# Patient Record
Sex: Female | Born: 1975
Health system: Southern US, Community
[De-identification: ages and names within clinical notes are randomized; demographics above are authoritative.]

## PROBLEM LIST (undated history)

## (undated) DIAGNOSIS — K429 Umbilical hernia without obstruction or gangrene: Secondary | ICD-10-CM

## (undated) DIAGNOSIS — K219 Gastro-esophageal reflux disease without esophagitis: Secondary | ICD-10-CM

## (undated) DIAGNOSIS — D649 Anemia, unspecified: Secondary | ICD-10-CM

## (undated) DIAGNOSIS — F329 Major depressive disorder, single episode, unspecified: Secondary | ICD-10-CM

## (undated) DIAGNOSIS — F419 Anxiety disorder, unspecified: Secondary | ICD-10-CM

## (undated) DIAGNOSIS — F4310 Post-traumatic stress disorder, unspecified: Secondary | ICD-10-CM

## (undated) DIAGNOSIS — B019 Varicella without complication: Secondary | ICD-10-CM

## (undated) DIAGNOSIS — J301 Allergic rhinitis due to pollen: Secondary | ICD-10-CM

## (undated) DIAGNOSIS — F32A Depression, unspecified: Secondary | ICD-10-CM

## (undated) HISTORY — DX: Allergic rhinitis due to pollen: J30.1

## (undated) HISTORY — DX: Varicella without complication: B01.9

## (undated) HISTORY — PX: APPENDECTOMY: SHX54

## (undated) HISTORY — DX: Anemia, unspecified: D64.9

## (undated) HISTORY — PX: TUBAL LIGATION: SHX77

## (undated) HISTORY — DX: Gastro-esophageal reflux disease without esophagitis: K21.9

---
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
## 2013-01-23 DIAGNOSIS — J301 Allergic rhinitis due to pollen: Secondary | ICD-10-CM

## 2013-01-23 HISTORY — DX: Allergic rhinitis due to pollen: J30.1

## 2013-04-23 ENCOUNTER — Emergency Department (HOSPITAL_COMMUNITY)
Admission: EM | Admit: 2013-04-23 | Discharge: 2013-04-23 | Disposition: A | Discharge status: Home / Self Care | Payer: Federal, State, Local not specified - PPO | Attending: Emergency Medicine | Admitting: Emergency Medicine

## 2013-04-23 ENCOUNTER — Encounter (HOSPITAL_COMMUNITY): Payer: Self-pay | Admitting: Emergency Medicine

## 2013-04-23 ENCOUNTER — Emergency Department (HOSPITAL_COMMUNITY): Payer: Federal, State, Local not specified - PPO

## 2013-04-23 DIAGNOSIS — F329 Major depressive disorder, single episode, unspecified: Secondary | ICD-10-CM | POA: Insufficient documentation

## 2013-04-23 DIAGNOSIS — F32A Depression, unspecified: Secondary | ICD-10-CM

## 2013-04-23 DIAGNOSIS — F411 Generalized anxiety disorder: Secondary | ICD-10-CM | POA: Insufficient documentation

## 2013-04-23 DIAGNOSIS — F419 Anxiety disorder, unspecified: Secondary | ICD-10-CM

## 2013-04-23 DIAGNOSIS — R109 Unspecified abdominal pain: Secondary | ICD-10-CM | POA: Insufficient documentation

## 2013-04-23 DIAGNOSIS — I880 Nonspecific mesenteric lymphadenitis: Secondary | ICD-10-CM | POA: Insufficient documentation

## 2013-04-23 DIAGNOSIS — N3090 Cystitis, unspecified without hematuria: Secondary | ICD-10-CM | POA: Insufficient documentation

## 2013-04-23 DIAGNOSIS — F3289 Other specified depressive episodes: Secondary | ICD-10-CM | POA: Insufficient documentation

## 2013-04-23 DIAGNOSIS — Z3202 Encounter for pregnancy test, result negative: Secondary | ICD-10-CM | POA: Insufficient documentation

## 2013-04-23 HISTORY — DX: Depression, unspecified: F32.A

## 2013-04-23 HISTORY — DX: Anxiety disorder, unspecified: F41.9

## 2013-04-23 HISTORY — DX: Major depressive disorder, single episode, unspecified: F32.9

## 2013-04-23 LAB — URINALYSIS, ROUTINE W REFLEX MICROSCOPIC
Bilirubin Urine: NEGATIVE
Glucose, UA: NEGATIVE mg/dL
Hgb urine dipstick: NEGATIVE
Ketones, ur: NEGATIVE mg/dL
LEUKOCYTES UA: NEGATIVE
Nitrite: NEGATIVE
PH: 6 (ref 5.0–8.0)
Protein, ur: NEGATIVE mg/dL
SPECIFIC GRAVITY, URINE: 1.02 (ref 1.005–1.030)
Urobilinogen, UA: 0.2 mg/dL (ref 0.0–1.0)

## 2013-04-23 LAB — I-STAT CHEM 8, ED
BUN: 9 mg/dL (ref 6–23)
Calcium, Ion: 1.18 mmol/L (ref 1.12–1.23)
Chloride: 104 mEq/L (ref 96–112)
Creatinine, Ser: 0.9 mg/dL (ref 0.50–1.10)
Glucose, Bld: 95 mg/dL (ref 70–99)
HEMATOCRIT: 39 % (ref 36.0–46.0)
HEMOGLOBIN: 13.3 g/dL (ref 12.0–15.0)
POTASSIUM: 4.2 meq/L (ref 3.7–5.3)
SODIUM: 140 meq/L (ref 137–147)
TCO2: 24 mmol/L (ref 0–100)

## 2013-04-23 LAB — PREGNANCY, URINE: Preg Test, Ur: NEGATIVE

## 2013-04-23 MED ORDER — HYDROCODONE-ACETAMINOPHEN 5-325 MG PO TABS: 1.0000 | ORAL_TABLET | Freq: Four times a day (QID) | ORAL | Status: DC | PRN

## 2013-04-23 MED ORDER — CLONAZEPAM 0.5 MG PO TABS: 0.5000 mg | ORAL_TABLET | Freq: Two times a day (BID) | ORAL | Status: DC | PRN

## 2013-04-23 MED ORDER — BUPROPION HCL ER (XL) 150 MG PO TB24: 150.0000 mg | ORAL_TABLET | Freq: Every day | ORAL | Status: DC

## 2013-04-23 NOTE — ED Notes (Signed)
Called main lab about UA, they said they will take care of it.

## 2013-04-23 NOTE — Discharge Instructions (Signed)

## 2013-04-23 NOTE — ED Notes (Signed)
Pt reports right flank pain for a few days and burning urination, sts last months cycle was worse than normal and very heavy, sts that it is time now for cycle again.

## 2013-04-23 NOTE — ED Notes (Signed)
Patient transported to CT 

## 2013-04-23 NOTE — ED Provider Notes (Signed)
CSN: [id number]     Arrival date & time 04/23/13  0606 History   First MD Initiated Contact with Patient 04/23/13 [phone]     Chief Complaint  Patient presents with  . Flank Pain  . Dysuria     (Consider location/radiation/quality/duration/timing/severity/associated sxs/prior Treatment) HPI Comments: Right flank pain with burning for the last couple of days. Pt states that she has not had fever, vomiting. Pain is achy in nature. Pt states that she is due for her period in the next week. Pt states that she has been under a lot of stress and she is new to the area. Pt states that she has been on wellbutrin and clonipin but have run out of both. Pt denies si/hi. Pt hasn't established care with a pcp in this are yet  Patient is a 38 y.o. female presenting with flank pain and dysuria. The history is provided by the patient. No language interpreter was used.  Flank Pain This is a new problem. The current episode started in the past 7 days. The problem occurs constantly. The problem has been unchanged. Pertinent negatives include no abdominal pain, fever or vomiting. Nothing aggravates the symptoms. She has tried nothing for the symptoms.  Dysuria Associated symptoms: flank pain   Associated symptoms: no abdominal pain, no fever and no vomiting     Past Medical History  Diagnosis Date  . Anxiety   . Depression    History reviewed. No pertinent past surgical history. History reviewed. No pertinent family history. History  Substance Use Topics  . Smoking status: Never Smoker   . Smokeless tobacco: Not on file  . Alcohol Use: No   OB History   Grav Para Term Preterm Abortions TAB SAB Ect Mult Living                 Review of Systems  Constitutional: Negative for fever.  Respiratory: Negative.   Cardiovascular: Negative.   Gastrointestinal: Negative for vomiting and abdominal pain.  Genitourinary: Positive for dysuria and flank pain.      Allergies  Review of patient's allergies  indicates not on file.  Home Medications  No current outpatient prescriptions on file. BP 126/79  Pulse 73  Temp(Src) 97.7 F (36.5 C) (Oral)  Resp 16  Ht 5\' 7"  (1.702 m)  Wt 230 lb (104.327 kg)  BMI 36.01 kg/m2  SpO2 98%  LMP 03/26/2013 Physical Exam  Nursing note and vitals reviewed. Constitutional: She is oriented to person, place, and time. She appears well-developed and well-nourished.  HENT:  Head: Normocephalic and atraumatic.  Cardiovascular: Normal rate and regular rhythm.   Pulmonary/Chest: Effort normal and breath sounds normal.  Abdominal: Soft. Bowel sounds are normal. There is no tenderness.  Musculoskeletal: Normal range of motion.  Neurological: She is alert and oriented to person, place, and time. Coordination normal.  Skin: Skin is warm and dry.  Psychiatric: She has a normal mood and affect.    ED Course  Procedures (including critical care time) Labs Review Labs Reviewed  URINALYSIS, ROUTINE W REFLEX MICROSCOPIC  PREGNANCY, URINE  I-STAT CHEM 8, ED   Imaging Review Ct Abdomen Pelvis Wo Contrast  04/23/2013   CLINICAL DATA:  Flank pain and dysuria  EXAM: CT ABDOMEN AND PELVIS WITHOUT CONTRAST  TECHNIQUE: Multidetector CT imaging of the abdomen and pelvis was performed following the standard protocol without oral or intravenous contrast maternal administration.  COMPARISON:  None.  FINDINGS: Lung bases are clear.  Liver is prominent, measuring 17.1 cm in  length. No focal liver lesions are identified on this noncontrast enhanced study. There is no biliary duct dilatation. Gallbladder wall does not appear appreciably thickened.  Spleen, pancreas, and adrenals appear normal. There is a 1.6 x 1.6 cm cyst in the lateral mid to upper pole left kidney. There is a prominent junctional parenchymal defect on the left, an anatomic variant. No non- cystic renal masses are appreciated on this study. There is no intrarenal calculus or hydronephrosis on either side. There is  no ureteral calculus or ureterectasis on either side.  In the pelvis, the urinary bladder is midline. There is slight urinary bladder wall thickening. There is no evidence of pelvic mass or fluid collection. There are phleboliths in the appendix. The appendix is not enlarged, and there is no periappendiceal region inflammation.  There is no bowel obstruction.  No free air or portal venous air.  There are scattered small mesenteric lymph nodes in the lower abdomen which do not meet size criteria for pathologic significance. By size criteria, there is no adenopathy in the abdomen or pelvis. There is no ascites or abscess in the abdomen or pelvis. There is a small ventral hernia containing only fat.  Aorta shows no evidence of aneurysm. There are no blastic or lytic bone lesions.  IMPRESSION: The wall of the urinary bladder is mildly thickened. Question a degree of cystitis. There is no renal or ureteral calculus. No hydronephrosis.  No mesenteric inflammation or abscess. Appendix region appears normal.  There are scattered small lymph nodes in the lower abdomen, primarily on the right. This finding is of questionable significance. A degree of mesenteric adenitis is possible in the appropriate clinical setting.  There is a small ventral hernia containing only fat. Liver is prominent but otherwise normal in appearance on this noncontrast enhanced study.   Electronically Signed   By: Lowella Grip M.D.   On: 04/23/2013 08:50     EKG Interpretation None      MDM   Final diagnoses:  Flank pain  Anxiety  Depression  Mesenteric adenitis  Cystitis    Cystitis likely chronic in nature. No infection noted in urine. No si/hi. Will refill chronic medications. Discussed adenitis with pt and don't think needs to be treated at this time although if symptoms worsen pt to follow up    Glendell Docker, NP 04/23/13 [phone]

## 2013-04-25 NOTE — ED Provider Notes (Signed)
Medical screening examination/treatment/procedure(s) were performed by non-physician practitioner and as supervising physician I was immediately available for consultation/collaboration.   EKG Interpretation None        Mariea Clonts, MD 04/25/13 2036

## 2013-05-04 ENCOUNTER — Emergency Department (HOSPITAL_COMMUNITY): Payer: Federal, State, Local not specified - PPO

## 2013-05-04 ENCOUNTER — Encounter (HOSPITAL_COMMUNITY): Payer: Self-pay | Admitting: Emergency Medicine

## 2013-05-04 ENCOUNTER — Emergency Department (HOSPITAL_COMMUNITY)
Admission: EM | Admit: 2013-05-04 | Discharge: 2013-05-04 | Disposition: A | Discharge status: Home / Self Care | Payer: Federal, State, Local not specified - PPO | Attending: Emergency Medicine | Admitting: Emergency Medicine

## 2013-05-04 DIAGNOSIS — Z8719 Personal history of other diseases of the digestive system: Secondary | ICD-10-CM | POA: Insufficient documentation

## 2013-05-04 DIAGNOSIS — IMO0002 Reserved for concepts with insufficient information to code with codable children: Secondary | ICD-10-CM | POA: Insufficient documentation

## 2013-05-04 DIAGNOSIS — S99919A Unspecified injury of unspecified ankle, initial encounter: Principal | ICD-10-CM

## 2013-05-04 DIAGNOSIS — W64XXXA Exposure to other animate mechanical forces, initial encounter: Secondary | ICD-10-CM | POA: Insufficient documentation

## 2013-05-04 DIAGNOSIS — S99929A Unspecified injury of unspecified foot, initial encounter: Secondary | ICD-10-CM

## 2013-05-04 DIAGNOSIS — Y9289 Other specified places as the place of occurrence of the external cause: Secondary | ICD-10-CM | POA: Insufficient documentation

## 2013-05-04 DIAGNOSIS — Z880 Allergy status to penicillin: Secondary | ICD-10-CM | POA: Insufficient documentation

## 2013-05-04 DIAGNOSIS — Y9389 Activity, other specified: Secondary | ICD-10-CM | POA: Insufficient documentation

## 2013-05-04 DIAGNOSIS — Z79899 Other long term (current) drug therapy: Secondary | ICD-10-CM | POA: Insufficient documentation

## 2013-05-04 DIAGNOSIS — F329 Major depressive disorder, single episode, unspecified: Secondary | ICD-10-CM | POA: Insufficient documentation

## 2013-05-04 DIAGNOSIS — S8990XA Unspecified injury of unspecified lower leg, initial encounter: Secondary | ICD-10-CM | POA: Insufficient documentation

## 2013-05-04 DIAGNOSIS — F3289 Other specified depressive episodes: Secondary | ICD-10-CM | POA: Insufficient documentation

## 2013-05-04 DIAGNOSIS — F411 Generalized anxiety disorder: Secondary | ICD-10-CM | POA: Insufficient documentation

## 2013-05-04 HISTORY — DX: Umbilical hernia without obstruction or gangrene: K42.9

## 2013-05-04 MED ORDER — IBUPROFEN 800 MG PO TABS: 800.0000 mg | ORAL_TABLET | Freq: Three times a day (TID) | ORAL | Status: DC

## 2013-05-04 NOTE — ED Provider Notes (Signed)
CSN: [id number]     Arrival date & time 05/04/13  1754 History  This chart was scribed for non-physician practitioner, Lorrine Kin, PA-C, working with Dot Lanes, MD by Ladene Artist, ED Scribe. This patient was seen in room WTR8/WTR8 and the patient's care was started at 7:26 PM.    Chief Complaint  Patient presents with  . Foot Injury    The history is provided by the patient. No language interpreter was used.   HPI Comments: Tiffany [name] is a 38 y.o. female who presents to the Emergency Department complaining of constant lateral L foot pain onset yesterday. Pt states that the pain radiates up her leg. Pt states that her husband and dog stepped on her foot.   Past Medical History  Diagnosis Date  . Anxiety   . Depression   . Umbilical hernia    Past Surgical History  Procedure Laterality Date  . Appendectomy     History reviewed. No pertinent family history. History  Substance Use Topics  . Smoking status: Never Smoker   . Smokeless tobacco: Not on file  . Alcohol Use: No   OB History   Grav Para Term Preterm Abortions TAB SAB Ect Mult Living                 Review of Systems  Musculoskeletal: Positive for gait problem, joint swelling and myalgias.  Skin: Negative for color change, pallor, rash and wound.  Neurological: Negative for numbness.  All other systems reviewed and are negative.    Allergies  Mango flavor and Penicillins  Home Medications   Current Outpatient Rx  Name  Route  Sig  Dispense  Refill  . buPROPion (WELLBUTRIN XL) 150 MG 24 hr tablet   Oral   Take 1 tablet (150 mg total) by mouth daily.   30 tablet   0   . clonazePAM (KLONOPIN) 0.5 MG tablet   Oral   Take 1 tablet (0.5 mg total) by mouth 2 (two) times daily as needed for anxiety.   30 tablet   0   . HYDROcodone-acetaminophen (NORCO/VICODIN) 5-325 MG per tablet   Oral   Take 1-2 tablets by mouth every 6 (six) hours as needed.   10 tablet   0    Triage Vitals: BP  119/79  Pulse 76  Temp(Src) 98.7 F (37.1 C) (Oral)  SpO2 99%  LMP 04/26/2013 Physical Exam  Nursing note and vitals reviewed. Constitutional: She appears well-developed and well-nourished. No distress.  HENT:  Head: Normocephalic and atraumatic.  Eyes: EOM are normal.  Neck: Normal range of motion. Neck supple.  Cardiovascular: Normal rate and regular rhythm.   Pulses:      Dorsalis pedis pulses are 2+ on the right side, and 2+ on the left side.  Pulmonary/Chest: Effort normal. No respiratory distress.  Musculoskeletal:       Left foot: She exhibits tenderness. She exhibits normal range of motion, normal capillary refill, no crepitus and no deformity.       Feet:  Minimal swelling on the L lateral dorsal foot, No crepitus, No ecchymosis, No erythema, Full ROM to left ankle and foot. Normal sensation to light touch.  Neurological: She is alert.  Skin: Skin is warm, dry and intact. No ecchymosis noted. She is not diaphoretic. No erythema.  Psychiatric: She has a normal mood and affect. Her behavior is normal.    ED Course  Procedures (including critical care time)  COORDINATION OF CARE: 7:34 PM-Discussed treatment plan  which includes cold compresses, ibuprofen and post-op shoe with pt at bedside and pt agreed to plan.   Labs Review Labs Reviewed - No data to display Imaging Review Dg Foot Complete Left  05/04/2013   CLINICAL DATA:  Posttraumatic lateral foot pain.  EXAM: LEFT FOOT - COMPLETE 3+ VIEW  COMPARISON:  None.  FINDINGS: The mineralization and alignment are normal. There is no evidence of acute fracture or dislocation. No focal soft tissue swelling identified.  IMPRESSION: No acute osseous findings.   Electronically Signed   By: Camie Patience M.D.   On: 05/04/2013 18:43     EKG Interpretation None      MDM   Final diagnoses:  Foot injury   Pt with a foot injury, negative XR. Post op shoe, crutches ordered. RICE encouraged, ortho follow up. Discussed imaging  results, and treatment plan with the patient. Return precautions given. Reports understanding and no other concerns at this time.  Patient is stable for discharge at this time.  Meds given in ED:  Medications - No data to display  Discharge Medication List as of 05/04/2013  7:48 PM    START taking these medications   Details  ibuprofen (ADVIL,MOTRIN) 800 MG tablet Take 1 tablet (800 mg total) by mouth 3 (three) times daily. Take with meals, Starting 05/04/2013, Until Discontinued, Print        I personally performed the services described in this documentation, which was scribed in my presence. The recorded information has been reviewed and is accurate.    Lorrine Kin, PA-C 05/06/13 1140

## 2013-05-04 NOTE — ED Notes (Signed)
Pt states that her husband accidentally stepped on her L foot and now she has pain and feels like its broken. Alert and oriented.

## 2013-05-04 NOTE — Discharge Instructions (Signed)
Call Dr. Mardelle Matte for further evaluation of your foot injury. Call for a follow up appointment with a Family or Primary Care Provider.  Return if Symptoms worsen.   Take medication as prescribed.  Ice your foot 3-4 times a day, elevate your foot when you are not walking or standing.

## 2013-05-08 NOTE — ED Provider Notes (Signed)
Medical screening examination/treatment/procedure(s) were performed by non-physician practitioner and as supervising physician I was immediately available for consultation/collaboration.    Dot Lanes, MD 05/08/13 1540

## 2013-05-09 ENCOUNTER — Ambulatory Visit (INDEPENDENT_AMBULATORY_CARE_PROVIDER_SITE_OTHER): Payer: Federal, State, Local not specified - PPO | Admitting: General Surgery

## 2013-05-09 ENCOUNTER — Encounter (INDEPENDENT_AMBULATORY_CARE_PROVIDER_SITE_OTHER): Payer: Self-pay | Admitting: General Surgery

## 2013-05-09 VITALS — BP 108/70 | HR 68 | Temp 97.8°F | Resp 14 | Ht 67.0 in | Wt 240.0 lb

## 2013-05-09 DIAGNOSIS — K439 Ventral hernia without obstruction or gangrene: Secondary | ICD-10-CM

## 2013-05-09 NOTE — Patient Instructions (Signed)
Please call if you develop any lower abdominal mass or increased pain

## 2013-05-09 NOTE — Progress Notes (Addendum)
Patient ID: Tiffany [name], female   DOB: 1975-04-07, 38 y.o.   MRN: [id number]  Chief Complaint  Patient presents with  . New Evaluation    abd hernia    HPI Tiffany [name] is a 38 y.o. female.  Chief complaint: Ventral hernia seen on CT scan HPI Patient was having some suprapubic abdominal pain which was also extending around to her right flank. She was evaluated in the emergency department. CT scan demonstrated cystitis which she has been treated for. There was an incidental finding of a small ventral hernia containing fat at that time. She continues to have some lower abdominal pain without complain of discrete mass. No change in bowel habits. I was asked to see her in consultation by Dr. Mancel Bale regarding this noted hernia.  Past Medical History  Diagnosis Date  . Anxiety   . Depression   . Umbilical hernia     Past Surgical History  Procedure Laterality Date  . Cesarean section      2003  . Tubal ligation      2013  . Appendectomy      2002    Family History  Problem Relation Age of Onset  . Heart disease Mother 73    heart attack  . Hypertension Father 23    Social History History  Substance Use Topics  . Smoking status: Never Smoker   . Smokeless tobacco: Never Used  . Alcohol Use: No    Allergies  Allergen Reactions  . Mango Flavor Anaphylaxis and Swelling  . Penicillins Other (See Comments)    unknown    Current Outpatient Prescriptions  Medication Sig Dispense Refill  . buPROPion (WELLBUTRIN XL) 150 MG 24 hr tablet Take 1 tablet (150 mg total) by mouth daily.  30 tablet  0  . clonazePAM (KLONOPIN) 0.5 MG tablet Take 1 tablet (0.5 mg total) by mouth 2 (two) times daily as needed for anxiety.  30 tablet  0  . ibuprofen (ADVIL,MOTRIN) 800 MG tablet Take 1 tablet (800 mg total) by mouth 3 (three) times daily. Take with meals  21 tablet  0  . Naproxen Sodium 220 MG CAPS Take by mouth.      . tranexamic acid (LYSTEDA) 650 MG TABS tablet Take 1,300 mg by mouth  3 (three) times daily. During cycle       No current facility-administered medications for this visit.    Review of Systems Review of Systems  Constitutional: Negative for fever, chills and unexpected weight change.  HENT: Negative for congestion, hearing loss, sore throat, trouble swallowing and voice change.   Eyes: Negative for visual disturbance.  Respiratory: Negative for cough and wheezing.   Cardiovascular: Negative for chest pain, palpitations and leg swelling.  Gastrointestinal: Positive for abdominal pain. Negative for nausea, vomiting, diarrhea, constipation, blood in stool, abdominal distention and anal bleeding.       See history of present illness  Genitourinary: Positive for flank pain and pelvic pain. Negative for hematuria, vaginal bleeding and difficulty urinating.  Musculoskeletal: Negative for arthralgias.  Skin: Negative for rash and wound.  Neurological: Negative for seizures, syncope and headaches.  Hematological: Negative for adenopathy. Does not bruise/bleed easily.  Psychiatric/Behavioral: Negative for confusion.    Blood pressure 108/70, pulse 68, temperature 97.8 F (36.6 C), resp. rate 14, height 5\' 7"  (1.702 m), weight 240 lb (108.863 kg), last menstrual period 04/26/2013.  Physical Exam Physical Exam  Constitutional: She is oriented to person, place, and time. She appears well-developed and well-nourished.  HENT:  Head: Normocephalic and atraumatic.  Eyes: EOM are normal. Pupils are equal, round, and reactive to light.  Neck: Normal range of motion. Neck supple. No tracheal deviation present.  Cardiovascular: Normal rate and normal heart sounds.   Pulmonary/Chest: Effort normal and breath sounds normal. No stridor. No respiratory distress. She has no wheezes. She has no rales.  Abdominal: Soft. She exhibits no distension. There is no tenderness. There is no rebound and no guarding.  No lower midline hernia or other hernias felt, no masses, no  tenderness  Musculoskeletal: Normal range of motion. She exhibits no tenderness.  Neurological: She is alert and oriented to person, place, and time. She exhibits normal muscle tone.  Skin: Skin is warm and dry.  Psychiatric: She has a normal mood and affect.    Data Reviewed CT scan as described above  Assessment    No appreciable significant ventral hernia    Plan    If patient develops a mass along her lower midline or increased pain in that area, she will give [place] a call. There is no need for surgical intervention at this time.       Zenovia Jarred 05/09/2013, 9:49 AM

## 2013-06-10 ENCOUNTER — Encounter (HOSPITAL_COMMUNITY): Payer: Self-pay | Admitting: Pharmacist

## 2013-06-20 ENCOUNTER — Encounter (HOSPITAL_COMMUNITY): Admission: RE | Payer: Self-pay | Source: Ambulatory Visit

## 2013-06-20 SURGERY — DILATATION & CURETTAGE/HYSTEROSCOPY WITH NOVASURE ABLATION
Anesthesia: Choice

## 2013-07-07 ENCOUNTER — Ambulatory Visit (HOSPITAL_COMMUNITY)
Admission: RE | Admit: 2013-07-07 | Payer: Federal, State, Local not specified - PPO | Source: Ambulatory Visit | Admitting: Obstetrics and Gynecology

## 2013-07-15 ENCOUNTER — Ambulatory Visit (INDEPENDENT_AMBULATORY_CARE_PROVIDER_SITE_OTHER): Payer: Federal, State, Local not specified - PPO | Admitting: Family Medicine

## 2013-07-15 ENCOUNTER — Encounter: Payer: Self-pay | Admitting: Family Medicine

## 2013-07-15 VITALS — BP 112/80 | HR 79 | Temp 98.7°F | Ht 68.5 in | Wt 241.0 lb

## 2013-07-15 DIAGNOSIS — L723 Sebaceous cyst: Secondary | ICD-10-CM

## 2013-07-15 DIAGNOSIS — M545 Low back pain, unspecified: Secondary | ICD-10-CM

## 2013-07-15 DIAGNOSIS — L729 Follicular cyst of the skin and subcutaneous tissue, unspecified: Secondary | ICD-10-CM

## 2013-07-15 DIAGNOSIS — F329 Major depressive disorder, single episode, unspecified: Secondary | ICD-10-CM

## 2013-07-15 DIAGNOSIS — F410 Panic disorder [episodic paroxysmal anxiety] without agoraphobia: Secondary | ICD-10-CM

## 2013-07-15 DIAGNOSIS — F32A Depression, unspecified: Secondary | ICD-10-CM

## 2013-07-15 DIAGNOSIS — F411 Generalized anxiety disorder: Secondary | ICD-10-CM

## 2013-07-15 DIAGNOSIS — F3289 Other specified depressive episodes: Secondary | ICD-10-CM

## 2013-07-15 MED ORDER — DOXYCYCLINE HYCLATE 100 MG PO TABS: 100.0000 mg | ORAL_TABLET | Freq: Two times a day (BID) | ORAL | Status: DC

## 2013-07-15 MED ORDER — PAROXETINE HCL 10 MG PO TABS: 10.0000 mg | ORAL_TABLET | Freq: Every day | ORAL | Status: DC

## 2013-07-15 NOTE — Progress Notes (Signed)
No chief complaint on file.   HPI:  Tiffany [name] is here to establish care. Moved her in January from [place]. Last PCP and physical:  Has the following chronic problems and concerns today:  Boil under L armpit: -had a pea size cyst under arm in the past treated with compresses -now more tender and enlarged the last week  R low back: -started 2 days ago when getting out of her car and pulled sometime -denies: fevers, malaise, chills, weakness, numbness, bowel or bladder incontinence  Depression and anxiety: -since move -GAD and depressed mood at times, about 2 panic attacks a month and klonopin helps with this -denies: hx of suicidal thoughts or hospitalization, thoughts of self harm manic symptoms -currently on wellbutrin xl 150mg , not working well, lexapro didn't help much -sensitive to medications -not pregnant  Menorrhagia: -goes to ob/gyn for this and treated with tranexamic acid  Patient Active Problem List   Diagnosis Date Noted  . Ventral hernia 05/09/2013   Health Maintenance:  ROS: See pertinent positives and negatives per HPI.  Past Medical History  Diagnosis Date  . Anxiety   . Depression   . Umbilical hernia     Family History  Problem Relation Age of Onset  . Heart disease Mother 68    heart attack  . Hypertension Father 29    History   Social History  . Marital Status: Married    Spouse Name: N/A    Number of Children: N/A  . Years of Education: N/A   Social History Main Topics  . Smoking status: Never Smoker   . Smokeless tobacco: Never Used  . Alcohol Use: No  . Drug Use: No  . Sexual Activity: None   Other Topics Concern  . None   Social History Narrative   Work or School: works for [employer]: lives with husband and 4 children (12-20yo), husband is supportive "wonderful"      Spiritual Beliefs: Christian      Lifestyle: walks 3 times per week;  working on diet             Current outpatient prescriptions:clonazePAM (KLONOPIN) 0.5 MG tablet, Take 1 tablet (0.5 mg total) by mouth 2 (two) times daily as needed for anxiety., Disp: 30 tablet, Rfl: 0;  Multiple Vitamin (MULTIVITAMIN) tablet, Take 1 tablet by mouth daily., Disp: , Rfl: ;  tranexamic acid (LYSTEDA) 650 MG TABS tablet, Take 1,300 mg by mouth 2 (two) times daily. During cycle, Disp: , Rfl:  doxycycline (VIBRA-TABS) 100 MG tablet, Take 1 tablet (100 mg total) by mouth 2 (two) times daily., Disp: 20 tablet, Rfl: 0;  PARoxetine (PAXIL) 10 MG tablet, Take 1 tablet (10 mg total) by mouth daily., Disp: 30 tablet, Rfl: 3  EXAM:  Filed Vitals:   07/15/13 1432  BP: 112/80  Pulse: 79  Temp: 98.7 F (37.1 C)    Body mass index is 36.11 kg/(m^2).  GENERAL: vitals reviewed and listed above, alert, oriented, appears well hydrated and in no acute distress  HEENT: atraumatic, conjunttiva clear, no obvious abnormalities on inspection of external nose and ears  NECK: no obvious masses on inspection  LUNGS: clear to auscultation bilaterally, no wheezes, rales or rhonchi, good air movement  CV: HRRR, no peripheral edema  MS: moves all extremities without noticeable abnormality Normal Gait Normal inspection of back, no obvious scoliosis or leg length descrepancy No bony TTP Soft tissue TTP at: -/+  tests: neg trendelenburg,-facet loading, -SLRT, -CLRT, -FABER, -FADIR Normal muscle strength, sensation to light touch and DTRs in LEs bilaterally  SKIN: draining boil/cyst L axillary  PSYCH: pleasant and cooperative, no obvious depression or anxiety  ASSESSMENT AND PLAN:  Discussed the following assessment and plan:  GAD (generalized anxiety disorder) - Plan: PARoxetine (PAXIL) 10 MG tablet  Depression - Plan: PARoxetine (PAXIL) 10 MG tablet  Panic disorder - Plan: PARoxetine (PAXIL) 10 MG tablet  Low back pain without sciatica, unspecified back pain laterality  Cyst of  skin - Plan: doxycycline (VIBRA-TABS) 100 MG tablet  -We reviewed the PMH, PSH, FH, SH, Meds and Allergies. -We provided refills for any medications we will prescribe as needed. -We addressed current concerns per orders and patient instructions. -We have asked for records for pertinent exams, studies, vaccines and notes from previous providers. -We have advised patient to follow up per instructions below.   -Patient advised to return or notify a doctor immediately if symptoms worsen or persist or new concerns arise.  There are no Patient Instructions on file for this visit.   Colin Benton R.

## 2013-07-15 NOTE — Progress Notes (Signed)
Pre visit review using our clinic review tool, if applicable. No additional management support is needed unless otherwise documented below in the visit note. 

## 2013-07-15 NOTE — Patient Instructions (Signed)
-  For Depression and Anxiety: -start the paxil: as we discussed we are starting a low dose of this given your history of sensitivity to medication but we have plenty of room to increase if needed. -get counseling - ask for exercises to do at home -klonpin on a very rare basis for panic attacks  For cyst: -start the antibiotic today -compresses -follow up as needed  For low back pain: -heat for 15 minutes twice dialy -aleve or tylenol per instructions -exercises provided, follow up in 1 month

## 2013-08-14 ENCOUNTER — Encounter: Payer: Self-pay | Admitting: Family Medicine

## 2013-08-14 ENCOUNTER — Ambulatory Visit (INDEPENDENT_AMBULATORY_CARE_PROVIDER_SITE_OTHER): Payer: Federal, State, Local not specified - PPO | Admitting: Family Medicine

## 2013-08-14 VITALS — BP 108/70 | HR 62 | Temp 98.7°F | Ht 68.5 in | Wt 242.0 lb

## 2013-08-14 DIAGNOSIS — F329 Major depressive disorder, single episode, unspecified: Secondary | ICD-10-CM

## 2013-08-14 DIAGNOSIS — L729 Follicular cyst of the skin and subcutaneous tissue, unspecified: Secondary | ICD-10-CM

## 2013-08-14 DIAGNOSIS — L723 Sebaceous cyst: Secondary | ICD-10-CM

## 2013-08-14 DIAGNOSIS — F419 Anxiety disorder, unspecified: Secondary | ICD-10-CM

## 2013-08-14 DIAGNOSIS — F32A Depression, unspecified: Secondary | ICD-10-CM

## 2013-08-14 DIAGNOSIS — F341 Dysthymic disorder: Secondary | ICD-10-CM

## 2013-08-14 DIAGNOSIS — M545 Low back pain, unspecified: Secondary | ICD-10-CM

## 2013-08-14 MED ORDER — BUPROPION HCL ER (XL) 150 MG PO TB24: 150.0000 mg | ORAL_TABLET | Freq: Every day | ORAL | Status: DC

## 2013-08-14 NOTE — Patient Instructions (Signed)
-  paxil 10mg  dialy for 2 more weeks, then increase to 20mg  daily -Get counseling -wellbutrin 150mg  daily  -We placed a referral for you as discussed to the dermatologist for the cyst under your arm. It usually takes about 1-2 weeks to process and schedule this referral. If you have not heard from [place] regarding this appointment in 2 weeks please contact our office.  -follow up in 2 months

## 2013-08-14 NOTE — Progress Notes (Signed)
Pre visit review using our clinic review tool, if applicable. No additional management support is needed unless otherwise documented below in the visit note. 

## 2013-08-14 NOTE — Progress Notes (Signed)
No chief complaint on file.   HPI:  1 Month follow up for:  1) Back pain: -started 1 month ago, R low back -treated for strain with heat, HEP, NSAIDs -reports: resolved  2) Boil L armpit: -treated with abx and compresses -reports: recurrent flares, wants to see derm  3) Depression and Anxiety: -with occasional panic attack -last visit advised: starting paxil, (low dose as she reported very sensitive to meds), counseling, rare use of prn benzo for panic attacks -reports she started the paxil about 3 weeks ago - but she read about all of side effects to paxil on the Internet and now she is anxious about all of the side effects -reports on wellbutrin and zoloft in the past which seemed to balance side effects for her -reports: has had some panic attacks and took klonopin for this, anhedonia, lack of motivation -denies: SI, thoughts of self harm -walking 3 miles per day -has not gotten counseling  ROS: See pertinent positives and negatives per HPI.  Past Medical History  Diagnosis Date  . Anxiety   . Depression   . Umbilical hernia     Past Surgical History  Procedure Laterality Date  . Cesarean section      2003  . Tubal ligation      2013  . Appendectomy      2002    Family History  Problem Relation Age of Onset  . Heart disease Mother 49    heart attack  . Hypertension Father 67    History   Social History  . Marital Status: Married    Spouse Name: N/A    Number of Children: N/A  . Years of Education: N/A   Social History Main Topics  . Smoking status: Never Smoker   . Smokeless tobacco: Never Used  . Alcohol Use: No  . Drug Use: No  . Sexual Activity: None   Other Topics Concern  . None   Social History Narrative   Work or School: works for [employer]: lives with husband and 4 children (12-20yo), husband is supportive "wonderful"      Spiritual Beliefs: Christian      Lifestyle: walks 3 times per week; working on diet             Current outpatient prescriptions:Multiple Vitamin (MULTIVITAMIN) tablet, Take 1 tablet by mouth daily., Disp: , Rfl: ;  PARoxetine (PAXIL) 10 MG tablet, Take 1 tablet (10 mg total) by mouth daily., Disp: 30 tablet, Rfl: 3;  buPROPion (WELLBUTRIN XL) 150 MG 24 hr tablet, Take 1 tablet (150 mg total) by mouth daily., Disp: 90 tablet, Rfl: 1  EXAM:  Filed Vitals:   08/14/13 0855  BP: 108/70  Pulse: 62  Temp: 98.7 F (37.1 C)    Body mass index is 36.26 kg/(m^2).  GENERAL: vitals reviewed and listed above, alert, oriented, appears well hydrated and in no acute distress  HEENT: atraumatic, conjunttiva clear, no obvious abnormalities on inspection of external nose and ears  NECK: no obvious masses on inspection  LUNGS: clear to auscultation bilaterally, no wheezes, rales or rhonchi, good air movement  CV: HRRR, no peripheral edema  MS: moves all extremities without noticeable abnormality  PSYCH: pleasant and cooperative, no obvious depression or anxiety  ASSESSMENT AND PLAN:  Discussed the following assessment and plan:  Anxiety and depression - Plan: buPROPion (WELLBUTRIN XL) 150 MG 24 hr tablet  Cyst of skin -  Plan: Ambulatory referral to Dermatology  Low back pain, unspecified back pain laterality, with sciatica presence unspecified  -discussed options for her anxiety and depression, psych referral for help with meds given her concerns, etc - she prefers to continue paxil and add webutrin and get counseling, risks discussed, follow up 2 months -suspect lesion under arm is cyst give recurrent swelling and drainage - advised eval with dermatologist and possible removal, discussed other potential etioligies, referral placed -back pain resolved -Patient advised to return or notify a doctor immediately if symptoms worsen or persist or new concerns arise.  Patient Instructions  -paxil 10mg  dialy for 2 more weeks,  then increase to 20mg  daily -Get counseling -wellbutrin 150mg  daily  -We placed a referral for you as discussed to the dermatologist for the cyst under your arm. It usually takes about 1-2 weeks to process and schedule this referral. If you have not heard from [place] regarding this appointment in 2 weeks please contact our office.  -follow up in 2 months      Krystall Kruckenberg R.

## 2013-10-16 ENCOUNTER — Ambulatory Visit: Payer: Federal, State, Local not specified - PPO | Admitting: Family Medicine

## 2013-10-27 ENCOUNTER — Encounter (HOSPITAL_COMMUNITY): Payer: Self-pay | Admitting: Emergency Medicine

## 2013-10-27 DIAGNOSIS — F419 Anxiety disorder, unspecified: Secondary | ICD-10-CM | POA: Diagnosis not present

## 2013-10-27 DIAGNOSIS — Z3202 Encounter for pregnancy test, result negative: Secondary | ICD-10-CM | POA: Insufficient documentation

## 2013-10-27 DIAGNOSIS — Z79899 Other long term (current) drug therapy: Secondary | ICD-10-CM | POA: Insufficient documentation

## 2013-10-27 DIAGNOSIS — Z880 Allergy status to penicillin: Secondary | ICD-10-CM | POA: Diagnosis not present

## 2013-10-27 DIAGNOSIS — Z8719 Personal history of other diseases of the digestive system: Secondary | ICD-10-CM | POA: Diagnosis not present

## 2013-10-27 DIAGNOSIS — N939 Abnormal uterine and vaginal bleeding, unspecified: Secondary | ICD-10-CM | POA: Diagnosis not present

## 2013-10-27 DIAGNOSIS — F329 Major depressive disorder, single episode, unspecified: Secondary | ICD-10-CM | POA: Insufficient documentation

## 2013-10-27 NOTE — ED Notes (Signed)
Pt. reports heavy menstrual bleeding with clots for 2 months / low abdominal cramping .

## 2013-10-28 ENCOUNTER — Emergency Department (HOSPITAL_COMMUNITY)
Admission: EM | Admit: 2013-10-28 | Discharge: 2013-10-28 | Disposition: A | Discharge status: Home / Self Care | Payer: Federal, State, Local not specified - PPO | Attending: Emergency Medicine | Admitting: Emergency Medicine

## 2013-10-28 DIAGNOSIS — N939 Abnormal uterine and vaginal bleeding, unspecified: Secondary | ICD-10-CM

## 2013-10-28 LAB — COMPREHENSIVE METABOLIC PANEL
ALT: 10 U/L (ref 0–35)
AST: 12 U/L (ref 0–37)
Albumin: 3.8 g/dL (ref 3.5–5.2)
Alkaline Phosphatase: 74 U/L (ref 39–117)
Anion gap: 12 (ref 5–15)
BUN: 9 mg/dL (ref 6–23)
CO2: 24 mEq/L (ref 19–32)
Calcium: 8.9 mg/dL (ref 8.4–10.5)
Chloride: 104 mEq/L (ref 96–112)
Creatinine, Ser: 0.96 mg/dL (ref 0.50–1.10)
GFR calc Af Amer: 85 mL/min — ABNORMAL LOW (ref >90)
GFR calc non Af Amer: 74 mL/min — ABNORMAL LOW (ref >90)
Glucose, Bld: 98 mg/dL (ref 70–99)
Potassium: 4.1 mEq/L (ref 3.7–5.3)
Sodium: 140 mEq/L (ref 137–147)
Total Bilirubin: 0.3 mg/dL (ref 0.3–1.2)
Total Protein: 8.1 g/dL (ref 6.0–8.3)

## 2013-10-28 LAB — CBC WITH DIFFERENTIAL/PLATELET
Basophils Absolute: 0 10*3/uL (ref 0.0–0.1)
Basophils Relative: 0 % (ref 0–1)
Eosinophils Absolute: 0.1 10*3/uL (ref 0.0–0.7)
Eosinophils Relative: 2 % (ref 0–5)
HCT: 35.1 % — ABNORMAL LOW (ref 36.0–46.0)
Hemoglobin: 11.3 g/dL — ABNORMAL LOW (ref 12.0–15.0)
Lymphocytes Relative: 22 % (ref 12–46)
Lymphs Abs: 1.6 10*3/uL (ref 0.7–4.0)
MCH: 23.9 pg — ABNORMAL LOW (ref 26.0–34.0)
MCHC: 32.2 g/dL (ref 30.0–36.0)
MCV: 74.4 fL — ABNORMAL LOW (ref 78.0–100.0)
Monocytes Absolute: 0.6 10*3/uL (ref 0.1–1.0)
Monocytes Relative: 9 % (ref 3–12)
Neutro Abs: 4.8 10*3/uL (ref 1.7–7.7)
Neutrophils Relative %: 67 % (ref 43–77)
Platelets: 408 10*3/uL — ABNORMAL HIGH (ref 150–400)
RBC: 4.72 MIL/uL (ref 3.87–5.11)
RDW: 15.7 % — ABNORMAL HIGH (ref 11.5–15.5)
WBC: 7.1 10*3/uL (ref 4.0–10.5)

## 2013-10-28 LAB — URINALYSIS, ROUTINE W REFLEX MICROSCOPIC
Bilirubin Urine: NEGATIVE
Glucose, UA: NEGATIVE mg/dL
Ketones, ur: NEGATIVE mg/dL
Leukocytes, UA: NEGATIVE
Nitrite: NEGATIVE
Protein, ur: NEGATIVE mg/dL
Specific Gravity, Urine: 1.019 (ref 1.005–1.030)
Urobilinogen, UA: 0.2 mg/dL (ref 0.0–1.0)
pH: 5.5 (ref 5.0–8.0)

## 2013-10-28 LAB — URINE MICROSCOPIC-ADD ON

## 2013-10-28 LAB — PREGNANCY, URINE: Preg Test, Ur: NEGATIVE

## 2013-10-28 MED ORDER — ONDANSETRON HCL 4 MG/2ML IJ SOLN
4.0000 mg | Freq: Once | INTRAMUSCULAR | Status: AC
  Administered 2013-10-28: 4 mg via INTRAVENOUS
  Filled 2013-10-28: qty 2

## 2013-10-28 MED ORDER — MORPHINE SULFATE 4 MG/ML IJ SOLN
4.0000 mg | Freq: Once | INTRAMUSCULAR | Status: AC
  Administered 2013-10-28: 4 mg via INTRAVENOUS
  Filled 2013-10-28: qty 1

## 2013-10-28 MED ORDER — MEGESTROL ACETATE 40 MG PO TABS: 40.0000 mg | ORAL_TABLET | Freq: Two times a day (BID) | ORAL | Status: DC

## 2013-10-28 MED ORDER — SODIUM CHLORIDE 0.9 % IV SOLN
Freq: Once | INTRAVENOUS | Status: AC
  Administered 2013-10-28: 02:00:00 via INTRAVENOUS

## 2013-10-28 MED ORDER — MEGESTROL ACETATE 40 MG PO TABS
40.0000 mg | ORAL_TABLET | Freq: Once | ORAL | Status: AC
  Administered 2013-10-28: 40 mg via ORAL
  Filled 2013-10-28: qty 1

## 2013-10-28 MED ORDER — OXYCODONE-ACETAMINOPHEN 5-325 MG PO TABS: 1.0000 | ORAL_TABLET | Freq: Four times a day (QID) | ORAL | Status: DC | PRN

## 2013-10-28 NOTE — ED Provider Notes (Signed)
CSN: [id number]     Arrival date & time 10/27/13  2350 History   First MD Initiated Contact with Patient 10/28/13 0053     Chief Complaint  Patient presents with  . Vaginal Bleeding     (Consider location/radiation/quality/duration/timing/severity/associated sxs/prior Treatment) HPI Comments: History of dysfunctional uterine bleeding was scheduled for D&C 2 months ago.  She backed out due to fear, since that time.  She has had persistent daily, vaginal bleeding.  She is in the last 3, days.  It has gotten worse, heavier passing more clots and now she is having some diffuse, abdominal cramping/pain.  She has tried taking over-the-counter ibuprofen, without any relief She states she has been since the bleeding started.  She denies any vaginal discharge.  She is going through numerous pads daily.  She has a history of a C-section, tubal ligation, and appendectomy  Patient is a 38 y.o. female presenting with vaginal bleeding. The history is provided by the patient.  Vaginal Bleeding Quality:  Heavier than menses Severity:  Moderate Onset quality:  Gradual Timing:  Constant Progression:  Worsening Chronicity:  Chronic Menstrual history:  Regular Possible pregnancy: no   Context: at rest   Relieved by:  Nothing Worsened by:  Nothing tried Ineffective treatments:  None tried Associated symptoms: abdominal pain and fatigue   Associated symptoms: no dysuria, no fever and no nausea   Abdominal pain:    Location:  Generalized   Quality:  Cramping   Duration:  1 day   Timing:  Intermittent   Progression:  Worsening   Chronicity:  New Risk factors: does not have multiple partners, no new sexual partner, no ovarian cysts, no prior miscarriage, no STD, no STD exposure and does not have unprotected sex     Past Medical History  Diagnosis Date  . Anxiety   . Depression   . Umbilical hernia    Past Surgical History  Procedure Laterality Date  . Cesarean section      2003  . Tubal  ligation      2013  . Appendectomy      2002   Family History  Problem Relation Age of Onset  . Heart disease Mother 46    heart attack  . Hypertension Father 61   History  Substance Use Topics  . Smoking status: Never Smoker   . Smokeless tobacco: Never Used  . Alcohol Use: No   OB History   Grav Para Term Preterm Abortions TAB SAB Ect Mult Living                 Review of Systems  Unable to perform ROS Constitutional: Positive for fatigue. Negative for fever and chills.  Cardiovascular: Negative for leg swelling.  Gastrointestinal: Positive for abdominal pain. Negative for nausea, vomiting, diarrhea and constipation.  Genitourinary: Positive for vaginal bleeding. Negative for dysuria and pelvic pain.  Musculoskeletal: Negative for myalgias.  All other systems reviewed and are negative.     Allergies  Mango flavor and Penicillins  Home Medications   Prior to Admission medications   Medication Sig Start Date End Date Taking? Authorizing Provider  buPROPion (WELLBUTRIN XL) 150 MG 24 hr tablet Take 1 tablet (150 mg total) by mouth daily. 08/14/13  Yes Lucretia Kern, DO  clonazePAM (KLONOPIN) 0.5 MG tablet Take 0.5 mg by mouth 3 (three) times daily as needed for anxiety.   Yes Historical Provider, MD  Multiple Vitamin (MULTIVITAMIN) tablet Take 1 tablet by mouth daily.   Yes  Historical Provider, MD  megestrol (MEGACE) 40 MG tablet Take 1 tablet (40 mg total) by mouth 2 (two) times daily. 10/28/13   Garald Balding, NP  oxyCODONE-acetaminophen (PERCOCET/ROXICET) 5-325 MG per tablet Take 1-2 tablets by mouth every 6 (six) hours as needed for severe pain. 10/28/13   Garald Balding, NP   BP 127/79  Pulse 57  Temp(Src) 98.1 F (36.7 C) (Oral)  Resp 14  Ht 5\' 7"  (1.702 m)  Wt 229 lb (103.874 kg)  BMI 35.86 kg/m2  SpO2 100%  LMP 10/24/2013 Physical Exam  Constitutional: She is oriented to person, place, and time. She appears well-developed and well-nourished.  HENT:  Head:  Normocephalic.  Eyes: Pupils are equal, round, and reactive to light.  Neck: Normal range of motion.  Cardiovascular: Normal rate.   Pulmonary/Chest: Effort normal and breath sounds normal.  Abdominal: Soft. Bowel sounds are normal. She exhibits no distension. There is no tenderness.  Neurological: She is alert and oriented to person, place, and time.  Skin: Skin is warm.    ED Course  Procedures (including critical care time) Labs Review Labs Reviewed  CBC WITH DIFFERENTIAL - Abnormal; Notable for the following:    Hemoglobin 11.3 (*)    HCT 35.1 (*)    MCV 74.4 (*)    MCH 23.9 (*)    RDW 15.7 (*)    Platelets 408 (*)    All other components within normal limits  COMPREHENSIVE METABOLIC PANEL - Abnormal; Notable for the following:    GFR calc non Af Amer 74 (*)    GFR calc Af Amer 85 (*)    All other components within normal limits  URINALYSIS, ROUTINE W REFLEX MICROSCOPIC - Abnormal; Notable for the following:    Hgb urine dipstick MODERATE (*)    All other components within normal limits  URINE MICROSCOPIC-ADD ON - Abnormal; Notable for the following:    Squamous Epithelial / LPF FEW (*)    Bacteria, UA FEW (*)    All other components within normal limits  PREGNANCY, URINE    Imaging Review No results found.   EKG Interpretation None     Labs reviewed  Started on Megace 40 mg BID and referred back to her OB for definitive care  MDM   Final diagnoses:  Abnormal vaginal bleeding         Garald Balding, NP 10/28/13 0355  Garald Balding, NP 10/28/13 [phone]

## 2013-10-28 NOTE — Discharge Instructions (Signed)
Abnormal Uterine Bleeding Abnormal uterine bleeding can affect women at various stages in life, including teenagers, women in their reproductive years, pregnant women, and women who have reached menopause. Several kinds of uterine bleeding are considered abnormal, including:  Bleeding or spotting between periods.   Bleeding after sexual intercourse.   Bleeding that is heavier or more than normal.   Periods that last longer than usual.  Bleeding after menopause.  Many cases of abnormal uterine bleeding are minor and simple to treat, while others are more serious. Any type of abnormal bleeding should be evaluated by your health care provider. Treatment will depend on the cause of the bleeding. HOME CARE INSTRUCTIONS Monitor your condition for any changes. The following actions may help to alleviate any discomfort you are experiencing:  Avoid the use of tampons and douches as directed by your health care provider.  Change your pads frequently. You should get regular pelvic exams and Pap tests. Keep all follow-up appointments for diagnostic tests as directed by your health care provider.  SEEK MEDICAL CARE IF:   Your bleeding lasts more than 1 week.   You feel dizzy at times.  SEEK IMMEDIATE MEDICAL CARE IF:   You pass out.   You are changing pads every 15 to 30 minutes.   You have abdominal pain.  You have a fever.   You become sweaty or weak.   You are passing large blood clots from the vagina.   You start to feel nauseous and vomit. MAKE SURE YOU:   Understand these instructions.  Will watch your condition.  Will get help right away if you are not doing well or get worse. Document Released: 01/09/2005 Document Revised: 01/14/2013 Document Reviewed: 08/08/2012 [facility] Patient Information 2015 [place], [place]. This information is not intended to replace advice given to you by your health care provider. Make sure you discuss any questions you have with your  health care provider. You have been given a prescription for Megace Please take as directed  Make an appointment with your OB/GYN this week

## 2013-10-31 ENCOUNTER — Encounter (HOSPITAL_COMMUNITY): Payer: Self-pay | Admitting: Physician Assistant

## 2013-10-31 ENCOUNTER — Ambulatory Visit (INDEPENDENT_AMBULATORY_CARE_PROVIDER_SITE_OTHER): Payer: Federal, State, Local not specified - PPO | Admitting: Physician Assistant

## 2013-10-31 VITALS — BP 113/74 | HR 77 | Ht 67.0 in | Wt 234.0 lb

## 2013-10-31 DIAGNOSIS — Z5329 Procedure and treatment not carried out because of patient's decision for other reasons: Secondary | ICD-10-CM

## 2013-10-31 NOTE — ED Provider Notes (Signed)
Medical screening examination/treatment/procedure(s) were performed by non-physician practitioner and as supervising physician I was immediately available for consultation/collaboration.   EKG Interpretation None       Virgel Manifold, MD 10/31/13 [phone]

## 2013-11-04 ENCOUNTER — Ambulatory Visit (HOSPITAL_COMMUNITY): Payer: Self-pay | Admitting: Physician Assistant

## 2013-11-12 NOTE — Patient Instructions (Signed)
Patient rescheduled due to office emergency

## 2013-11-28 ENCOUNTER — Ambulatory Visit (HOSPITAL_COMMUNITY): Payer: Self-pay | Admitting: Psychiatry

## 2013-12-04 ENCOUNTER — Ambulatory Visit (INDEPENDENT_AMBULATORY_CARE_PROVIDER_SITE_OTHER): Payer: Federal, State, Local not specified - PPO | Admitting: Family Medicine

## 2013-12-04 ENCOUNTER — Encounter: Payer: Self-pay | Admitting: Family Medicine

## 2013-12-04 VITALS — BP 110/78 | HR 92 | Temp 98.5°F | Ht 67.0 in | Wt 237.4 lb

## 2013-12-04 DIAGNOSIS — D473 Essential (hemorrhagic) thrombocythemia: Secondary | ICD-10-CM

## 2013-12-04 DIAGNOSIS — D500 Iron deficiency anemia secondary to blood loss (chronic): Secondary | ICD-10-CM

## 2013-12-04 DIAGNOSIS — D75839 Thrombocytosis, unspecified: Secondary | ICD-10-CM

## 2013-12-04 DIAGNOSIS — J069 Acute upper respiratory infection, unspecified: Secondary | ICD-10-CM

## 2013-12-04 LAB — CBC WITH DIFFERENTIAL/PLATELET
BASOS ABS: 0 10*3/uL (ref 0.0–0.1)
Basophils Relative: 0.5 % (ref 0.0–3.0)
Eosinophils Absolute: 0.2 10*3/uL (ref 0.0–0.7)
Eosinophils Relative: 2 % (ref 0.0–5.0)
HEMATOCRIT: 35.2 % — AB (ref 36.0–46.0)
Hemoglobin: 11.1 g/dL — ABNORMAL LOW (ref 12.0–15.0)
Lymphocytes Relative: 26.1 % (ref 12.0–46.0)
Lymphs Abs: 2 10*3/uL (ref 0.7–4.0)
MCHC: 31.5 g/dL (ref 30.0–36.0)
MCV: 76.5 fl — ABNORMAL LOW (ref 78.0–100.0)
MONOS PCT: 7.5 % (ref 3.0–12.0)
Monocytes Absolute: 0.6 10*3/uL (ref 0.1–1.0)
NEUTROS PCT: 63.9 % (ref 43.0–77.0)
Neutro Abs: 4.9 10*3/uL (ref 1.4–7.7)
PLATELETS: 350 10*3/uL (ref 150.0–400.0)
RBC: 4.6 Mil/uL (ref 3.87–5.11)
RDW: 17.3 % — ABNORMAL HIGH (ref 11.5–15.5)
WBC: 7.6 10*3/uL (ref 4.0–10.5)

## 2013-12-04 NOTE — Progress Notes (Signed)
Pre visit review using our clinic review tool, if applicable. No additional management support is needed unless otherwise documented below in the visit note. 

## 2013-12-04 NOTE — Progress Notes (Signed)
HPI:   Acute visit for:  "elevated platelets" -of note, has hx of iron deficiency anemia, secondary to very heavy menstrual bleeding -seeing ob/gyn for this and treated with tranexamic acid in the past, per her report passed out recently from significant blood loss during last menstrual period and seen in ED and told needed transfusion which she refused, followed up with gyn and now is scheduled for hysterectomy -bleeding very heavy with periods, last period 3 weeks ago -taking iron sporatically - reports she has difficulty remembering to take it -reports: she is very worried about this and cancers and reports her gynecologist told her needs a work up for her platelets  -denies: recent surgery, infection or trauma,headache, visual symptoms, atypical chest pain, acral dysesthesia, thromboses, fevers, malaise, weight loss -medications: wellbutrin and multivitamin  URI: -started 3 days ago -symptoms: nasal congesiton, PND, sore throat, cough -denies: fevers, sinus or tooth pain, SOB   ROS: See pertinent positives and negatives per HPI.  Past Medical History  Diagnosis Date  . Anxiety   . Depression   . Umbilical hernia     Past Surgical History  Procedure Laterality Date  . Cesarean section      2003  . Tubal ligation      2013  . Appendectomy      2002    Family History  Problem Relation Age of Onset  . Heart disease Mother 65    heart attack  . Hypertension Father 69    History   Social History  . Marital Status: Married    Spouse Name: N/A    Number of Children: N/A  . Years of Education: N/A   Social History Main Topics  . Smoking status: Never Smoker   . Smokeless tobacco: Never Used  . Alcohol Use: No  . Drug Use: No  . Sexual Activity: None   Other Topics Concern  . None   Social History Narrative   Work or School: works for [employer]: lives with husband and 4 children  (12-20yo), husband is supportive "wonderful"      Spiritual Beliefs: Christian      Lifestyle: walks 3 times per week; working on diet             Current outpatient prescriptions: buPROPion (WELLBUTRIN XL) 300 MG 24 hr tablet, Take 300 mg by mouth daily., Disp: , Rfl: ;  Multiple Vitamin (MULTIVITAMIN) capsule, Take 1 capsule by mouth daily., Disp: , Rfl:   EXAM:  Filed Vitals:   12/04/13 1333  BP: 110/78  Pulse: 92  Temp: 98.5 F (36.9 C)    Body mass index is 37.17 kg/(m^2).  GENERAL: vitals reviewed and listed above, alert, oriented, appears well hydrated and in no acute distress  HEENT: atraumatic, conjunttiva clear, no obvious abnormalities on inspection of external nose and ears, normal appearance of ear canals and TMs, clear nasal congestion, mild post oropharyngeal erythema with PND, no tonsillar edema or exudate, no sinus TTP  NECK: no obvious masses on inspection  LUNGS: clear to auscultation bilaterally, no wheezes, rales or rhonchi, good air movement  CV: HRRR, no peripheral edema  MS: moves all extremities without noticeable abnormality  PSYCH: pleasant and cooperative, no obvious depression or anxiety  ASSESSMENT AND PLAN:  Discussed the following assessment and plan:  Iron deficiency anemia due to chronic blood loss  Thrombocytosis - Plan: CBC with Differential  Viral upper respiratory illness  -  likely reactionary thrombocytosis 2ndary to iron def anemia due to menorrhagia per review of CBC from 2015, she is scheduled for hysterectomy, no symptoms to suggest malignanacy - but advised continued care with her gynecologist regarding the abnormal menstrual bleeding -she is very worried about this and wants to see a hematologist - will check cbc wit diff today and place referral if still present per her request  -advised she adhere to gyn recs for her iron therapy -VURI - advised supportive case, return precuations -Patient advised to return or notify a  doctor immediately if symptoms worsen or persist or new concerns arise.  There are no Patient Instructions on file for this visit.   Colin Benton R.

## 2013-12-15 ENCOUNTER — Ambulatory Visit (HOSPITAL_COMMUNITY): Payer: Self-pay | Admitting: Psychiatry

## 2013-12-24 ENCOUNTER — Encounter: Payer: Self-pay | Admitting: Family Medicine

## 2013-12-25 ENCOUNTER — Other Ambulatory Visit: Payer: Self-pay | Admitting: Obstetrics and Gynecology

## 2013-12-25 NOTE — Progress Notes (Signed)
This patient no-showed for her appointment.

## 2013-12-26 ENCOUNTER — Ambulatory Visit (INDEPENDENT_AMBULATORY_CARE_PROVIDER_SITE_OTHER): Payer: Federal, State, Local not specified - PPO | Admitting: Psychiatry

## 2013-12-26 ENCOUNTER — Encounter (HOSPITAL_COMMUNITY): Payer: Self-pay | Admitting: Psychiatry

## 2013-12-26 VITALS — BP 121/80 | HR 77 | Ht 67.0 in | Wt 238.0 lb

## 2013-12-26 DIAGNOSIS — F331 Major depressive disorder, recurrent, moderate: Secondary | ICD-10-CM

## 2013-12-26 DIAGNOSIS — F411 Generalized anxiety disorder: Secondary | ICD-10-CM

## 2013-12-26 MED ORDER — BUPROPION HCL ER (XL) 150 MG PO TB24: 150.0000 mg | ORAL_TABLET | ORAL | Status: DC

## 2013-12-26 MED ORDER — ESCITALOPRAM OXALATE 10 MG PO TABS: 5.0000 mg | ORAL_TABLET | Freq: Every day | ORAL | Status: DC

## 2013-12-26 MED ORDER — CLONAZEPAM 0.5 MG PO TABS: 0.5000 mg | ORAL_TABLET | Freq: Every day | ORAL | Status: DC | PRN

## 2013-12-26 NOTE — Progress Notes (Signed)
Patient ID: Tiffany [name], female   DOB: [birth year], 38 y.o.   MRN: [id number]  [facility] Initial Psychiatric Assessment   Tiffany [name] [id number] 38 y.o.  12/26/2013 4:23 PM  Chief Complaint:  Depression  History of Present Illness:   Patient Presents for Initial Evaluation with symptoms of depression.  Patient is 38 years old currently married African-American female. She is living with her husband and 4 kids. They have recently moved from [place] she works with [employer] for the last 20 years. Says that she has been on different medications starting couple of years ago when she was having stress related to work she started having panic attacks when her manager giving her a hard time. She also had gone to ED and rule out any cardiac problems and heart back since she was having chest pain palpitations as if she is going to choke or die. After that she was diagnosed with generalized anxiety disorder and follows up with her primary care physician. She did change her job despite that she still has had anxiety. Says that Wellbutrin helped her depression but it did not help her anxiety. She's been on different medications in the past including Paxil that made her body warm. Lexapro made her sleepy Zoloft made her cry.  She still felt Wellbutrin was a good choice but she was on 300 mg.  As of now she is only on Klonopin recently given because of her recent anxiety and visit to the ED. She is on 0.5 mg once a day  She still endorses feeling down, anhedonia, weakness disturbed sleep and energy level. She has been diagnosed with anemia and excessive bleeding for which she is going to get hysterectomy. She is worried about having gastrectomy and feels that her worries are sometimes excessive or unreasonable and she constantly thinks about it that makes her fatigue and her depression gets worse.  Severity: 4/10 .  10 being no depression. Context: job stress, worried about her  surgery Modifying factors: supportive husband and family. Less job stress compared to when in [place].  Does not endorse hopelessness suicidal or homicidal thoughts patient does endorse feeling withdrawn and having crying spells.  Does not endorse delusions or hallucinations, there is no psychotic symptoms. There is no current manic symptoms over the past does not endorse any paranoia.        Past Psychiatric History/Hospitalization(s) Denies. Mostly followed with primary care. Have no psych admissions or Suicide attempts.   Hospitalization for psychiatric illness: No History of Electroconvulsive Shock Therapy: No Prior Suicide Attempts: No  Medical History; Past Medical History  Diagnosis Date  . Anxiety   . Depression   . Umbilical hernia     Allergies: Allergies  Allergen Reactions  . Mango Flavor Anaphylaxis and Swelling  . Penicillins Other (See Comments)    unknown    Medications: Outpatient Encounter Prescriptions as of 12/26/2013  Medication Sig  . clobetasol ointment (TEMOVATE) 5.39 % Apply 1 application topically 2 (two) times daily. To scalp  . clonazePAM (KLONOPIN) 0.5 MG tablet Take 1 tablet (0.5 mg total) by mouth daily as needed for anxiety.  Marland Kitchen doxycycline (VIBRA-TABS) 100 MG tablet Take 100 mg by mouth 2 (two) times daily. Take for 1 month  . ferrous sulfate 325 (65 FE) MG tablet Take 325 mg by mouth 2 (two) times daily with a meal.  . ibuprofen (ADVIL,MOTRIN) 200 MG tablet Take 400 mg by mouth every 8 (eight) hours as needed for mild pain or  moderate pain.  . Multiple Vitamin (MULTIVITAMIN) capsule Take 1 capsule by mouth daily.  . [DISCONTINUED] buPROPion (WELLBUTRIN) 100 MG tablet Take 100 mg by mouth 2 (two) times daily.  . [DISCONTINUED] clonazePAM (KLONOPIN) 0.5 MG tablet Take 0.5 mg by mouth daily as needed for anxiety.  Marland Kitchen buPROPion (WELLBUTRIN XL) 150 MG 24 hr tablet Take 1 tablet (150 mg total) by mouth every morning.  . escitalopram (LEXAPRO) 10 MG  tablet Take 0.5 tablets (5 mg total) by mouth daily.     Substance Abuse History: Denies. States to be non smoker, no alcohol or using drugs.   Family History; Family History  Problem Relation Age of Onset  . Heart disease Mother 46    heart attack  . Depression Mother   . Hypertension Father 62      Biopsychosocial History:  Grew up with her parents. Her mom was age 10 when she had the baby. Says that she did not get along with her mom and the role was having arguments. She did finish high school and 2 years of college. Says that it was not too happy when she was growing up because her mom apparently may have had some depression.  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 She's been married for 18 years and she has 4 kids age 46, 32 and 61 years old to 2 boys.     Labs:  Recent Results (from the past 2160 hour(s))  CBC with Differential     Status: Abnormal   Collection Time: 10/28/13 12:25 AM  Result Value Ref Range   WBC 7.1 4.0 - 10.5 K/uL   RBC 4.72 3.87 - 5.11 MIL/uL   Hemoglobin 11.3 (L) 12.0 - 15.0 g/dL   HCT 35.1 (L) 36.0 - 46.0 %   MCV 74.4 (L) 78.0 - 100.0 fL   MCH 23.9 (L) 26.0 - 34.0 pg   MCHC 32.2 30.0 - 36.0 g/dL   RDW 15.7 (H) 11.5 - 15.5 %   Platelets 408 (H) 150 - 400 K/uL   Neutrophils Relative % 67 43 - 77 %   Neutro Abs 4.8 1.7 - 7.7 K/uL   Lymphocytes Relative 22 12 - 46 %   Lymphs Abs 1.6 0.7 - 4.0 K/uL   Monocytes Relative 9 3 - 12 %   Monocytes Absolute 0.6 0.1 - 1.0 K/uL   Eosinophils Relative 2 0 - 5 %   Eosinophils Absolute 0.1 0.0 - 0.7 K/uL   Basophils Relative 0 0 - 1 %   Basophils Absolute 0.0 0.0 - 0.1 K/uL  Comprehensive metabolic panel     Status: Abnormal   Collection Time: 10/28/13 12:25 AM  Result Value Ref Range   Sodium 140 137 - 147 mEq/L   Potassium 4.1 3.7 - 5.3 mEq/L   Chloride 104 96 - 112 mEq/L   CO2 24 19 - 32  mEq/L   Glucose, Bld 98 70 - 99 mg/dL   BUN 9 6 - 23 mg/dL   Creatinine, Ser 0.96 0.50 - 1.10 mg/dL   Calcium 8.9 8.4 - 10.5 mg/dL   Total Protein 8.1 6.0 - 8.3 g/dL   Albumin 3.8 3.5 - 5.2 g/dL   AST 12 0 - 37 U/L   ALT 10 0 - 35 U/L   Alkaline Phosphatase 74 39 - 117 U/L   Total Bilirubin 0.3 0.3 - 1.2 mg/dL  GFR calc non Af Amer 74 (L) >90 mL/min   GFR calc Af Amer 85 (L) >90 mL/min    Comment: (NOTE) The eGFR has been calculated using the CKD EPI equation. This calculation has not been validated in all clinical situations. eGFR's persistently <90 mL/min signify possible Chronic Kidney Disease.   Anion gap 12 5 - 15  Urinalysis, Routine w reflex microscopic     Status: Abnormal   Collection Time: 10/28/13  1:29 AM  Result Value Ref Range   Color, Urine YELLOW YELLOW   APPearance CLEAR CLEAR   Specific Gravity, Urine 1.019 1.005 - 1.030   pH 5.5 5.0 - 8.0   Glucose, UA NEGATIVE NEGATIVE mg/dL   Hgb urine dipstick MODERATE (A) NEGATIVE   Bilirubin Urine NEGATIVE NEGATIVE   Ketones, ur NEGATIVE NEGATIVE mg/dL   Protein, ur NEGATIVE NEGATIVE mg/dL   Urobilinogen, UA 0.2 0.0 - 1.0 mg/dL   Nitrite NEGATIVE NEGATIVE   Leukocytes, UA NEGATIVE NEGATIVE  Pregnancy, urine     Status: None   Collection Time: 10/28/13  1:29 AM  Result Value Ref Range   Preg Test, Ur NEGATIVE NEGATIVE    Comment:        THE SENSITIVITY OF THIS METHODOLOGY IS >20 mIU/mL.  Urine microscopic-add on     Status: Abnormal   Collection Time: 10/28/13  1:29 AM  Result Value Ref Range   Squamous Epithelial / LPF FEW (A) RARE   WBC, UA 0-2 <3 WBC/hpf   RBC / HPF 0-2 <3 RBC/hpf   Bacteria, UA FEW (A) RARE   Urine-Other MUCOUS PRESENT   CBC with Differential     Status: Abnormal   Collection Time: 12/04/13  2:06 PM  Result Value Ref Range   WBC 7.6 4.0 - 10.5 K/uL   RBC 4.60 3.87 - 5.11 Mil/uL   Hemoglobin 11.1 (L) 12.0 - 15.0 g/dL   HCT 35.2 (L) 36.0 - 46.0 %   MCV 76.5 (L) 78.0 - 100.0 fl   MCHC  31.5 30.0 - 36.0 g/dL   RDW 17.3 (H) 11.5 - 15.5 %   Platelets 350.0 150.0 - 400.0 K/uL   Neutrophils Relative % 63.9 43.0 - 77.0 %   Lymphocytes Relative 26.1 12.0 - 46.0 %   Monocytes Relative 7.5 3.0 - 12.0 %   Eosinophils Relative 2.0 0.0 - 5.0 %   Basophils Relative 0.5 0.0 - 3.0 %   Neutro Abs 4.9 1.4 - 7.7 K/uL   Lymphs Abs 2.0 0.7 - 4.0 K/uL   Monocytes Absolute 0.6 0.1 - 1.0 K/uL   Eosinophils Absolute 0.2 0.0 - 0.7 K/uL   Basophils Absolute 0.0 0.0 - 0.1 K/uL       Musculoskeletal: Strength & Muscle Tone: within normal limits Gait & Station: normal Patient leans: N/A  Mental Status Examination;   Psychiatric Specialty Exam: Physical Exam  Constitutional: She appears well-developed and well-nourished. No distress.  HENT:  Head: Normocephalic and atraumatic.  Skin: She is not diaphoretic.    Review of Systems  Constitutional: Negative for fever.  Cardiovascular: Negative for chest pain.  Gastrointestinal: Negative for nausea.  Skin: Negative for rash.  Neurological: Negative for tremors and headaches.  Psychiatric/Behavioral: Positive for depression. Negative for suicidal ideas and substance abuse. The patient is nervous/anxious. The patient does not have insomnia.     Blood pressure 121/80, pulse 77, height $RemoveBe'5\' 7"'[id number]$  (1.702 m), weight 238 lb (107.956 kg).Body mass index is 37.27 kg/(m^2).  General Appearance: Casual  Eye Contact::  Fair  Speech:  Normal Rate  Volume:  Normal  Mood:  Dysphoric  Affect:  Congruent  Thought Process:  Coherent  Orientation:  Full (Time, Place, and Person)  Thought Content:  Rumination  Suicidal Thoughts:  No  Homicidal Thoughts:  No  Memory:  Immediate;   Fair Recent;   Fair  Judgement:  Fair  Insight:  Fair  Psychomotor Activity:  Normal  Concentration:  Fair  Recall:  Fair  Akathisia:  Negative  Handed:  Right  AIMS (if indicated):     Assets:  Communication Skills Desire for Improvement Financial  Resources/Insurance Housing Intimacy  Sleep:        Assessment: Axis I: Major depressive disorder recurrent moderate. Generalized anxiety disorder.  Axis II: Deferred  Axis III:  Past Medical History  Diagnosis Date  . Anxiety   . Depression   . Umbilical hernia     Axis IV: Psychosocial recently moved from [place].   Treatment Plan and Summary: Restart Wellbutrin but at a lower dose of 150 mg. She was having anxiety at a higher dose. Restart Lexapro she is not been on this medication for a while it has been causing her tiredness but we will start 5 mg and at night. She can continue Klonopin 0.5 mg once in a while for when necessary anxiety currently she is stressed out about her hysterectomy and her worries. She also make appointment with a therapist. Discussed interview side effects Pertinent Labs and Relevant Prior Notes reviewed. Medication Side effects, benefits and risks reviewed/discussed with Patient. Time given for patient to respond and asks questions regarding the Diagnosis and Medications. Safety concerns and to report to ER if suicidal or call 911. Relevant Medications refilled or called in to pharmacy. Discussed weight maintenance and Sleep Hygiene. Follow up with Primary care provider in regards to Medical conditions. Recommend compliance with medications and follow up office appointments. Discussed to avail opportunity to consider or/and continue Individual therapy with Counselor. Greater than 50% of time was spend in counseling and coordination of care with the patient.  Schedule for Follow up visit in 3 weeks or call in earlier as necessary.   Merian Capron, MD 12/26/2013

## 2013-12-27 ENCOUNTER — Other Ambulatory Visit (HOSPITAL_COMMUNITY): Payer: Self-pay | Admitting: Obstetrics and Gynecology

## 2013-12-27 NOTE — H&P (Signed)
Tiffany [name] is a 38 y.o. female P 2-1-2-4 presents for hysterectomy because of  menorrhagia.  For many years the patient used the Mirena IUD for contracepiton that yielded her amenorrheic Once she had it removed, following tubal sterilzation with Essure,  she began to bleed regularly and eventually twice a month for five days.  She has to change her pad every 1-1.5 hours and is accompanied by the passage of large clots.  She reports cramps that are rated at 8/10 on a 10 point pain scale with some relief form Ibuprofen (3/10) and complete relief with Percocet.  She was placed on Lysteda as a means of curtailing her bleeding but she found the results sub-optimal.  She was also prescribed Provera but has yet to try them. She denies changes in bowel and bladder function but admits to dyspareunia that is insertional.  In May 2015 an endometrial biopsy returned benign findings, H/H= 11.5/35.2 and TSH was normal.  A pelvic ultrasound revealed a uterus: 6.54 x 6.09 x 4.82 cm,  endometrium-0.464 cm, right ovary-3.43 x 1.54 x 1.27 cm and left ovary-2.87 x 1.71 x 1.00 cm.  A review of both medical and surgical management options were given to the patient however, after careful consideration the patient has decided to proceed with definitive therapy in the form of hysterectomy.  Past Medical History  OB History: G: 5;  P: 2-1-2-4;  SVB 1995 and 1999  largest infant 8 lbs. 4 oz.  and  2003 (twins) by C-section  GYN History: menarche: 38 YO    LMP: 12/17/13;  Contracepton Essure Tubal Sterilization  The patient reports a past history of: chlamydia and HPV.  Has a  history of abnormal PAP smear that was treated with conization in 1999;   Last PAP smear: April 2015-normal  Medical History: Vitamin D Deficiency, Umbilical Hernia, Anxiety, Depression and Migraine  Surgical History: 1999  Cold Knife Cervical Conization;  2002 Appendectomy;   2013  Essure Tubal Sterilization Denies problems with anesthesia or history of  blood transfusions  Family History: Heart Disease, Hypertension and Thyroid Disease  Social History: Married and employed with [employer];  Denies tobacco and alcohol use   Medication Clobetasol 0.05%  Cream  bid prn Clonazepam 0.5 mg daily Doxycycline 100 mg  daily Provera 10 mg  daily prn  Allergies  Allergen Reactions  . Mango Flavor Anaphylaxis and Swelling  . Penicillins Other (See Comments)    unknown    Denies sensitivity to peanuts, shellfish, soy, latex or adhesives.    ROS: Admits to glasses and occasional migraine headache but denies vision changes, nasal congestion, dysphagia, tinnitus, dizziness, hoarseness, cough,  chest pain, shortness of breath, nausea, vomiting, diarrhea,constipation,  urinary frequency, urgency  dysuria, hematuria, vaginitis symptoms, pelvic pain, swelling of joints,easy bruising,  myalgias, arthralgias, skin rashes, unexplained weight loss and except as is mentioned in the history of present illness, patient's review of systems is otherwise negative.    Physical Exam  Bp: 110/70   P: 80   R: 20   Temperature: 98.2 degrees F orally      Weight: 237 lbs.   Height: 5'7"   BMI: 37.1   Neck: supple without masses or thyromegaly Lungs: clear to auscultation Heart: regular rate and rhythm Abdomen: soft, non-tender and no organomegaly Pelvic:EGBUS- wnl; vagina-normal rugae; uterus-normal size, cervix without lesions or motion tenderness; adnexae-no tenderness or masses Extremities:  no clubbing, cyanosis or edema   Assesment: Menorrhagia   Disposition:  A discussion was  held with patient regarding the indication for her procedure(s) along with the risks, which include but are not limited to: reaction to anesthesia, damage to adjacent organs, infection and excessive bleeding. The patient verbalized understanding of these risks and has consented to proceed with a Total Laparoscopic Hysterectomy with Bilateral Salpingectomy and Cystoscopy  with Possible Laparoscopically Assisted Vaginal Hysterectomy and Possible Total Abdominal Hysterectomy at [place] on January 06, 2014.  CSN# [id number]   Kwesi Sangha J. Florene Glen, PA-C  for Dr. Harvie Bridge. Mancel Bale

## 2014-01-05 ENCOUNTER — Encounter (HOSPITAL_COMMUNITY): Payer: Self-pay

## 2014-01-05 ENCOUNTER — Encounter (HOSPITAL_COMMUNITY)
Admission: RE | Admit: 2014-01-05 | Discharge: 2014-01-05 | Disposition: A | Discharge status: Home / Self Care | Payer: Federal, State, Local not specified - PPO | Source: Ambulatory Visit | Attending: Obstetrics and Gynecology | Admitting: Obstetrics and Gynecology

## 2014-01-05 LAB — CBC
HCT: 36.7 % (ref 36.0–46.0)
HEMOGLOBIN: 12 g/dL (ref 12.0–15.0)
MCH: 24.9 pg — ABNORMAL LOW (ref 26.0–34.0)
MCHC: 32.7 g/dL (ref 30.0–36.0)
MCV: 76.1 fL — AB (ref 78.0–100.0)
Platelets: 370 10*3/uL (ref 150–400)
RBC: 4.82 MIL/uL (ref 3.87–5.11)
RDW: 16.6 % — AB (ref 11.5–15.5)
WBC: 8.6 10*3/uL (ref 4.0–10.5)

## 2014-01-05 MED ORDER — GENTAMICIN SULFATE 40 MG/ML IJ SOLN
INTRAVENOUS | Status: AC
  Administered 2014-01-06: 232 mL via INTRAVENOUS
  Filled 2014-01-05: qty 10

## 2014-01-05 NOTE — Patient Instructions (Signed)
   Your procedure is scheduled on: DEC 15 AT 115PM  Enter through the Main Entrance of [facility] at: [place] up the phone at the desk and dial [phone] and inform us of your arrival.  Please call this number if you have any problems the morning of surgery: [phone]  Remember: Do not eat food after midnight: DEC 14 Do not drink clear liquids after: 9AM ON DEC 15 Take these medicines the morning of surgery with a SIP OF WATER:  Do not wear jewelry,OR MAKE-UP. No metal in your hair or on your body. Do not wear lotions, powders, perfumes.  You may wear deodorant.  Do not bring valuables to the hospital. Contacts, dentures or bridgework may not be worn into surgery.  Leave suitcase in the car. After Surgery it may be brought to your room. For patients being admitted to the hospital, checkout time is 11:00am the day of discharge.    Patients discharged on the day of surgery will not be allowed to drive home.

## 2014-01-06 ENCOUNTER — Ambulatory Visit (HOSPITAL_COMMUNITY): Payer: Federal, State, Local not specified - PPO | Admitting: Anesthesiology

## 2014-01-06 ENCOUNTER — Encounter (HOSPITAL_COMMUNITY)
Admission: RE | Disposition: A | Discharge status: Home / Self Care | Payer: Self-pay | Source: Ambulatory Visit | Attending: Obstetrics and Gynecology

## 2014-01-06 ENCOUNTER — Inpatient Hospital Stay (HOSPITAL_COMMUNITY)
Admission: RE | Admit: 2014-01-06 | Discharge: 2014-01-09 | DRG: 742 | Disposition: A | Discharge status: Home / Self Care | Payer: Federal, State, Local not specified - PPO | Source: Ambulatory Visit | Attending: Obstetrics and Gynecology | Admitting: Obstetrics and Gynecology

## 2014-01-06 ENCOUNTER — Encounter (HOSPITAL_COMMUNITY): Payer: Self-pay

## 2014-01-06 DIAGNOSIS — E559 Vitamin D deficiency, unspecified: Secondary | ICD-10-CM | POA: Diagnosis present

## 2014-01-06 DIAGNOSIS — G43909 Migraine, unspecified, not intractable, without status migrainosus: Secondary | ICD-10-CM | POA: Diagnosis present

## 2014-01-06 DIAGNOSIS — Z880 Allergy status to penicillin: Secondary | ICD-10-CM

## 2014-01-06 DIAGNOSIS — D649 Anemia, unspecified: Secondary | ICD-10-CM | POA: Diagnosis present

## 2014-01-06 DIAGNOSIS — K9172 Accidental puncture and laceration of a digestive system organ or structure during other procedure: Secondary | ICD-10-CM | POA: Diagnosis present

## 2014-01-06 DIAGNOSIS — K660 Peritoneal adhesions (postprocedural) (postinfection): Secondary | ICD-10-CM | POA: Diagnosis present

## 2014-01-06 DIAGNOSIS — Z91018 Allergy to other foods: Secondary | ICD-10-CM

## 2014-01-06 DIAGNOSIS — N941 Dyspareunia: Secondary | ICD-10-CM | POA: Diagnosis present

## 2014-01-06 DIAGNOSIS — N938 Other specified abnormal uterine and vaginal bleeding: Secondary | ICD-10-CM | POA: Diagnosis present

## 2014-01-06 DIAGNOSIS — N920 Excessive and frequent menstruation with regular cycle: Principal | ICD-10-CM | POA: Diagnosis present

## 2014-01-06 DIAGNOSIS — F329 Major depressive disorder, single episode, unspecified: Secondary | ICD-10-CM | POA: Diagnosis present

## 2014-01-06 DIAGNOSIS — F419 Anxiety disorder, unspecified: Secondary | ICD-10-CM | POA: Diagnosis present

## 2014-01-06 HISTORY — PX: ABDOMINAL HYSTERECTOMY: SHX81

## 2014-01-06 HISTORY — PX: LAPAROSCOPIC HYSTERECTOMY: SHX1926

## 2014-01-06 HISTORY — PX: TRANSVERSE COLON RESECTION: SHX6155

## 2014-01-06 LAB — PREGNANCY, URINE: Preg Test, Ur: NEGATIVE

## 2014-01-06 SURGERY — HYSTERECTOMY, TOTAL, LAPAROSCOPIC
Anesthesia: General | Site: Abdomen

## 2014-01-06 MED ORDER — ONDANSETRON HCL 4 MG/2ML IJ SOLN
INTRAMUSCULAR | Status: DC | PRN
  Administered 2014-01-06: 4 mg via INTRAVENOUS

## 2014-01-06 MED ORDER — IBUPROFEN 600 MG PO TABS
600.0000 mg | ORAL_TABLET | Freq: Four times a day (QID) | ORAL | Status: DC | PRN
  Administered 2014-01-07 – 2014-01-09 (×5): 600 mg via ORAL
  Filled 2014-01-06 (×6): qty 1

## 2014-01-06 MED ORDER — MEPERIDINE HCL 25 MG/ML IJ SOLN: 6.2500 mg | INTRAMUSCULAR | Status: DC | PRN

## 2014-01-06 MED ORDER — MIDAZOLAM HCL 2 MG/2ML IJ SOLN: 0.5000 mg | Freq: Once | INTRAMUSCULAR | Status: DC | PRN

## 2014-01-06 MED ORDER — ROCURONIUM BROMIDE 100 MG/10ML IV SOLN
INTRAVENOUS | Status: AC
  Filled 2014-01-06: qty 1

## 2014-01-06 MED ORDER — ESCITALOPRAM OXALATE 5 MG PO TABS
5.0000 mg | ORAL_TABLET | Freq: Every day | ORAL | Status: DC
  Administered 2014-01-07 – 2014-01-09 (×3): 5 mg via ORAL
  Filled 2014-01-06 (×3): qty 1

## 2014-01-06 MED ORDER — PROPOFOL 10 MG/ML IV EMUL
INTRAVENOUS | Status: AC
  Filled 2014-01-06: qty 20

## 2014-01-06 MED ORDER — MENTHOL 3 MG MT LOZG
1.0000 | LOZENGE | OROMUCOSAL | Status: DC | PRN
  Administered 2014-01-08: 3 mg via ORAL
  Filled 2014-01-06: qty 9

## 2014-01-06 MED ORDER — INFLUENZA VAC SPLIT QUAD 0.5 ML IM SUSY: 0.5000 mL | PREFILLED_SYRINGE | INTRAMUSCULAR | Status: DC

## 2014-01-06 MED ORDER — HYDROMORPHONE HCL 1 MG/ML IJ SOLN
INTRAMUSCULAR | Status: AC
  Filled 2014-01-06: qty 1

## 2014-01-06 MED ORDER — HYDROMORPHONE HCL 1 MG/ML IJ SOLN
INTRAMUSCULAR | Status: DC | PRN
  Administered 2014-01-06 (×2): 1 mg via INTRAVENOUS

## 2014-01-06 MED ORDER — PROPOFOL 10 MG/ML IV BOLUS
INTRAVENOUS | Status: DC | PRN
  Administered 2014-01-06: 20 mg via INTRAVENOUS
  Administered 2014-01-06: 180 mg via INTRAVENOUS

## 2014-01-06 MED ORDER — LIDOCAINE HCL (CARDIAC) 20 MG/ML IV SOLN
INTRAVENOUS | Status: DC | PRN
  Administered 2014-01-06: 50 mg via INTRAVENOUS

## 2014-01-06 MED ORDER — HYDROMORPHONE 0.3 MG/ML IV SOLN
INTRAVENOUS | Status: DC
  Administered 2014-01-06: 20:00:00 via INTRAVENOUS
  Administered 2014-01-06: 0.3 mg via INTRAVENOUS
  Administered 2014-01-07: 1 mL via INTRAVENOUS
  Administered 2014-01-07: 1.8 mg via INTRAVENOUS
  Administered 2014-01-07: 2.7 mg via INTRAVENOUS
  Filled 2014-01-06: qty 25

## 2014-01-06 MED ORDER — HYDROMORPHONE HCL 1 MG/ML IJ SOLN
0.2500 mg | INTRAMUSCULAR | Status: DC | PRN
  Administered 2014-01-06 (×4): 0.5 mg via INTRAVENOUS

## 2014-01-06 MED ORDER — ACETAMINOPHEN 160 MG/5ML PO SOLN
ORAL | Status: AC
  Administered 2014-01-06: 975 mg via ORAL
  Filled 2014-01-06: qty 40.6

## 2014-01-06 MED ORDER — SCOPOLAMINE 1 MG/3DAYS TD PT72
1.0000 | MEDICATED_PATCH | Freq: Once | TRANSDERMAL | Status: DC
  Administered 2014-01-06: 1.5 mg via TRANSDERMAL

## 2014-01-06 MED ORDER — SODIUM CHLORIDE 0.9 % IJ SOLN: 9.0000 mL | INTRAMUSCULAR | Status: DC | PRN

## 2014-01-06 MED ORDER — HYDROMORPHONE HCL 1 MG/ML IJ SOLN
INTRAMUSCULAR | Status: AC
  Administered 2014-01-06: 0.5 mg via INTRAVENOUS
  Filled 2014-01-06: qty 1

## 2014-01-06 MED ORDER — DEXAMETHASONE SODIUM PHOSPHATE 4 MG/ML IJ SOLN
INTRAMUSCULAR | Status: AC
  Filled 2014-01-06: qty 1

## 2014-01-06 MED ORDER — KETOROLAC TROMETHAMINE 30 MG/ML IJ SOLN
30.0000 mg | Freq: Four times a day (QID) | INTRAMUSCULAR | Status: AC
  Administered 2014-01-06 – 2014-01-07 (×3): 30 mg via INTRAVENOUS
  Filled 2014-01-06 (×3): qty 1

## 2014-01-06 MED ORDER — GLYCOPYRROLATE 0.2 MG/ML IJ SOLN
INTRAMUSCULAR | Status: DC | PRN
  Administered 2014-01-06: 0.6 mg via INTRAVENOUS

## 2014-01-06 MED ORDER — SCOPOLAMINE 1 MG/3DAYS TD PT72
MEDICATED_PATCH | TRANSDERMAL | Status: AC
  Administered 2014-01-06: 1.5 mg via TRANSDERMAL
  Filled 2014-01-06: qty 1

## 2014-01-06 MED ORDER — ONDANSETRON HCL 4 MG/2ML IJ SOLN: 4.0000 mg | Freq: Four times a day (QID) | INTRAMUSCULAR | Status: DC | PRN

## 2014-01-06 MED ORDER — BUPIVACAINE HCL (PF) 0.25 % IJ SOLN
INTRAMUSCULAR | Status: DC | PRN
  Administered 2014-01-06: 9 mL

## 2014-01-06 MED ORDER — NALOXONE HCL 0.4 MG/ML IJ SOLN: 0.4000 mg | INTRAMUSCULAR | Status: DC | PRN

## 2014-01-06 MED ORDER — NEOSTIGMINE METHYLSULFATE 10 MG/10ML IV SOLN
INTRAVENOUS | Status: DC | PRN
  Administered 2014-01-06: 4 mg via INTRAVENOUS

## 2014-01-06 MED ORDER — CLINDAMYCIN PHOSPHATE 900 MG/50ML IV SOLN
900.0000 mg | Freq: Once | INTRAVENOUS | Status: AC
  Administered 2014-01-06: 900 mg via INTRAVENOUS
  Filled 2014-01-06: qty 50

## 2014-01-06 MED ORDER — MIDAZOLAM HCL 2 MG/2ML IJ SOLN
INTRAMUSCULAR | Status: AC
  Filled 2014-01-06: qty 2

## 2014-01-06 MED ORDER — ACETAMINOPHEN 160 MG/5ML PO SOLN
975.0000 mg | Freq: Once | ORAL | Status: AC
  Administered 2014-01-06: 975 mg via ORAL

## 2014-01-06 MED ORDER — MIDAZOLAM HCL 2 MG/2ML IJ SOLN
INTRAMUSCULAR | Status: DC | PRN
  Administered 2014-01-06: 2 mg via INTRAVENOUS

## 2014-01-06 MED ORDER — PROMETHAZINE HCL 25 MG/ML IJ SOLN: 6.2500 mg | INTRAMUSCULAR | Status: DC | PRN

## 2014-01-06 MED ORDER — OXYCODONE-ACETAMINOPHEN 5-325 MG PO TABS
1.0000 | ORAL_TABLET | ORAL | Status: DC | PRN
  Administered 2014-01-07 – 2014-01-08 (×4): 1 via ORAL
  Administered 2014-01-08: 2 via ORAL
  Administered 2014-01-08 – 2014-01-09 (×4): 1 via ORAL
  Filled 2014-01-06 (×7): qty 1
  Filled 2014-01-06: qty 2
  Filled 2014-01-06: qty 1

## 2014-01-06 MED ORDER — STERILE WATER FOR IRRIGATION IR SOLN
Status: DC | PRN
  Administered 2014-01-06: 3000 mL via INTRAVESICAL

## 2014-01-06 MED ORDER — KETOROLAC TROMETHAMINE 30 MG/ML IJ SOLN: 15.0000 mg | Freq: Once | INTRAMUSCULAR | Status: DC | PRN

## 2014-01-06 MED ORDER — NEOSTIGMINE METHYLSULFATE 10 MG/10ML IV SOLN
INTRAVENOUS | Status: AC
Start: 2014-01-06 — End: 2014-01-06
  Filled 2014-01-06: qty 1

## 2014-01-06 MED ORDER — BUPIVACAINE HCL (PF) 0.25 % IJ SOLN
INTRAMUSCULAR | Status: AC
  Filled 2014-01-06: qty 30

## 2014-01-06 MED ORDER — ONDANSETRON HCL 4 MG PO TABS: 4.0000 mg | ORAL_TABLET | Freq: Three times a day (TID) | ORAL | Status: DC | PRN

## 2014-01-06 MED ORDER — CLONAZEPAM 0.5 MG PO TABS: 0.5000 mg | ORAL_TABLET | Freq: Every day | ORAL | Status: DC | PRN

## 2014-01-06 MED ORDER — DIPHENHYDRAMINE HCL 12.5 MG/5ML PO ELIX: 12.5000 mg | ORAL_SOLUTION | Freq: Four times a day (QID) | ORAL | Status: DC | PRN

## 2014-01-06 MED ORDER — KETOROLAC TROMETHAMINE 30 MG/ML IJ SOLN
INTRAMUSCULAR | Status: DC | PRN
  Administered 2014-01-06: 30 mg via INTRAVENOUS

## 2014-01-06 MED ORDER — FERROUS SULFATE 325 (65 FE) MG PO TABS
325.0000 mg | ORAL_TABLET | Freq: Two times a day (BID) | ORAL | Status: DC
  Administered 2014-01-07 – 2014-01-09 (×5): 325 mg via ORAL
  Filled 2014-01-06 (×5): qty 1

## 2014-01-06 MED ORDER — LACTATED RINGERS IV SOLN
INTRAVENOUS | Status: DC
  Administered 2014-01-06 – 2014-01-07 (×2): via INTRAVENOUS

## 2014-01-06 MED ORDER — DOCUSATE SODIUM 100 MG PO CAPS
100.0000 mg | ORAL_CAPSULE | Freq: Two times a day (BID) | ORAL | Status: DC
  Administered 2014-01-07 – 2014-01-09 (×5): 100 mg via ORAL
  Filled 2014-01-06 (×5): qty 1

## 2014-01-06 MED ORDER — ONDANSETRON HCL 4 MG/2ML IJ SOLN
INTRAMUSCULAR | Status: AC
  Filled 2014-01-06: qty 2

## 2014-01-06 MED ORDER — ROCURONIUM BROMIDE 100 MG/10ML IV SOLN
INTRAVENOUS | Status: DC | PRN
  Administered 2014-01-06: 5 mg via INTRAVENOUS
  Administered 2014-01-06: 40 mg via INTRAVENOUS
  Administered 2014-01-06 (×2): 5 mg via INTRAVENOUS
  Administered 2014-01-06: 20 mg via INTRAVENOUS
  Administered 2014-01-06: 10 mg via INTRAVENOUS
  Administered 2014-01-06: 5 mg via INTRAVENOUS
  Administered 2014-01-06: 10 mg via INTRAVENOUS

## 2014-01-06 MED ORDER — BUPROPION HCL ER (XL) 150 MG PO TB24
150.0000 mg | ORAL_TABLET | Freq: Every day | ORAL | Status: DC
Start: 2014-01-07 — End: 2014-01-09
  Administered 2014-01-07 – 2014-01-09 (×3): 150 mg via ORAL
  Filled 2014-01-06 (×3): qty 1

## 2014-01-06 MED ORDER — FENTANYL CITRATE 0.05 MG/ML IJ SOLN
INTRAMUSCULAR | Status: AC
  Filled 2014-01-06: qty 5

## 2014-01-06 MED ORDER — DEXAMETHASONE SODIUM PHOSPHATE 10 MG/ML IJ SOLN
INTRAMUSCULAR | Status: DC | PRN
  Administered 2014-01-06: 4 mg via INTRAVENOUS

## 2014-01-06 MED ORDER — GLYCOPYRROLATE 0.2 MG/ML IJ SOLN
INTRAMUSCULAR | Status: AC
  Filled 2014-01-06: qty 3

## 2014-01-06 MED ORDER — FENTANYL CITRATE 0.05 MG/ML IJ SOLN
INTRAMUSCULAR | Status: DC | PRN
  Administered 2014-01-06: 50 ug via INTRAVENOUS
  Administered 2014-01-06 (×2): 100 ug via INTRAVENOUS
  Administered 2014-01-06 (×2): 50 ug via INTRAVENOUS
  Administered 2014-01-06: 100 ug via INTRAVENOUS
  Administered 2014-01-06: 50 ug via INTRAVENOUS

## 2014-01-06 MED ORDER — DIPHENHYDRAMINE HCL 50 MG/ML IJ SOLN
12.5000 mg | Freq: Four times a day (QID) | INTRAMUSCULAR | Status: DC | PRN
  Administered 2014-01-06: 12.5 mg via INTRAVENOUS
  Filled 2014-01-06: qty 1

## 2014-01-06 MED ORDER — LIDOCAINE HCL (CARDIAC) 20 MG/ML IV SOLN
INTRAVENOUS | Status: AC
  Filled 2014-01-06: qty 5

## 2014-01-06 MED ORDER — LACTATED RINGERS IV SOLN
INTRAVENOUS | Status: DC
  Administered 2014-01-06 (×2): via INTRAVENOUS
  Administered 2014-01-06: 10 mL/h via INTRAVENOUS
  Administered 2014-01-06: 15:00:00 via INTRAVENOUS

## 2014-01-06 MED ORDER — 0.9 % SODIUM CHLORIDE (POUR BTL) OPTIME
TOPICAL | Status: DC | PRN
  Administered 2014-01-06 (×3): 1000 mL

## 2014-01-06 SURGICAL SUPPLY — 81 items
BARRIER ADHS 3X4 INTERCEED (GAUZE/BANDAGES/DRESSINGS) IMPLANT
BENZOIN TINCTURE PRP APPL 2/3 (GAUZE/BANDAGES/DRESSINGS) ×3 IMPLANT
CANISTER SUCT 3000ML (MISCELLANEOUS) ×3 IMPLANT
CLOTH BEACON ORANGE TIMEOUT ST (SAFETY) ×6 IMPLANT
CONT PATH 16OZ SNAP LID 3702 (MISCELLANEOUS) ×3 IMPLANT
COVER MAYO STAND STRL (DRAPES) ×3 IMPLANT
DECANTER SPIKE VIAL GLASS SM (MISCELLANEOUS) IMPLANT
DISSECTOR BLUNT TIP ENDO 5MM (MISCELLANEOUS) IMPLANT
DISSECTOR SPONGE CHERRY (GAUZE/BANDAGES/DRESSINGS) ×3 IMPLANT
DRAPE HYSTEROSCOPY (DRAPE) ×3 IMPLANT
DRAPE WARM FLUID 44X44 (DRAPE) IMPLANT
DRSG OPSITE POSTOP 3X4 (GAUZE/BANDAGES/DRESSINGS) ×3 IMPLANT
DRSG OPSITE POSTOP 4X10 (GAUZE/BANDAGES/DRESSINGS) ×3 IMPLANT
DURAPREP 26ML APPLICATOR (WOUND CARE) ×6 IMPLANT
EVACUATOR SMOKE 8.L (FILTER) ×6 IMPLANT
GAUZE SPONGE 4X4 16PLY XRAY LF (GAUZE/BANDAGES/DRESSINGS) ×3 IMPLANT
GLOVE BIO SURGEON STRL SZ7.5 (GLOVE) ×6 IMPLANT
GLOVE BIOGEL PI IND STRL 7.0 (GLOVE) ×10 IMPLANT
GLOVE BIOGEL PI IND STRL 7.5 (GLOVE) ×6 IMPLANT
GLOVE BIOGEL PI INDICATOR 7.0 (GLOVE) ×5
GLOVE BIOGEL PI INDICATOR 7.5 (GLOVE) ×3
GLOVE ECLIPSE 7.0 STRL STRAW (GLOVE) ×3 IMPLANT
GLOVE ECLIPSE 7.5 STRL STRAW (GLOVE) ×3 IMPLANT
GLOVE SURG SS PI 7.5 STRL IVOR (GLOVE) ×3 IMPLANT
GOWN STRL REUS W/ TWL LRG LVL3 (GOWN DISPOSABLE) ×18 IMPLANT
GOWN STRL REUS W/TWL LRG LVL3 (GOWN DISPOSABLE) ×15 IMPLANT
HEMOSTAT SURGICEL 2X14 (HEMOSTASIS) IMPLANT
HEMOSTAT SURGICEL 2X3 (HEMOSTASIS) ×3 IMPLANT
HEMOSTAT SURGICEL 4X8 (HEMOSTASIS) IMPLANT
LIQUID BAND (GAUZE/BANDAGES/DRESSINGS) ×3 IMPLANT
NEEDLE HYPO 25X1 1.5 SAFETY (NEEDLE) IMPLANT
NEEDLE INSUFFLATION 120MM (ENDOMECHANICALS) ×3 IMPLANT
NS IRRIG 1000ML POUR BTL (IV SOLUTION) ×6 IMPLANT
OCCLUDER COLPOPNEUMO (BALLOONS) ×6 IMPLANT
PACK LAPAROSCOPY BASIN (CUSTOM PROCEDURE TRAY) ×3 IMPLANT
PACK LAVH (CUSTOM PROCEDURE TRAY) ×3 IMPLANT
PAD OB MATERNITY 4.3X12.25 (PERSONAL CARE ITEMS) ×3 IMPLANT
PAD TRENDELENBURG OR TABLE (MISCELLANEOUS) ×3 IMPLANT
PROTECTOR NERVE ULNAR (MISCELLANEOUS) ×3 IMPLANT
SCISSORS LAP 5X35 DISP (ENDOMECHANICALS) IMPLANT
SET CYSTO W/LG BORE CLAMP LF (SET/KITS/TRAYS/PACK) ×3 IMPLANT
SET IRRIG TUBING LAPAROSCOPIC (IRRIGATION / IRRIGATOR) ×3 IMPLANT
SHEARS HARMONIC ACE PLUS 36CM (ENDOMECHANICALS) IMPLANT
SLEEVE XCEL OPT CAN 5 100 (ENDOMECHANICALS) ×6 IMPLANT
SOLUTION ELECTROLUBE (MISCELLANEOUS) ×3 IMPLANT
SPONGE LAP 18X18 X RAY DECT (DISPOSABLE) ×9 IMPLANT
STAPLER VISISTAT 35W (STAPLE) IMPLANT
STRIP CLOSURE SKIN 1/2X4 (GAUZE/BANDAGES/DRESSINGS) ×3 IMPLANT
STRIP CLOSURE SKIN 1/4X4 (GAUZE/BANDAGES/DRESSINGS) IMPLANT
SUT CHROMIC 2 0 CT 1 (SUTURE) ×6 IMPLANT
SUT CHROMIC 2 0 SH (SUTURE) IMPLANT
SUT MNCRL AB 3-0 PS2 27 (SUTURE) ×12 IMPLANT
SUT MON AB 3-0 SH 27 (SUTURE)
SUT MON AB 3-0 SH27 (SUTURE) IMPLANT
SUT PDS AB 1 CT1 36 (SUTURE) IMPLANT
SUT PDS AB 1 CTX 36 (SUTURE) IMPLANT
SUT PLAIN 2 0 XLH (SUTURE) ×6 IMPLANT
SUT SILK 3 0 SH CR/8 (SUTURE) ×3 IMPLANT
SUT VIC AB 0 CT1 18XCR BRD8 (SUTURE) ×10 IMPLANT
SUT VIC AB 0 CT1 27 (SUTURE) ×5
SUT VIC AB 0 CT1 27XBRD ANBCTR (SUTURE) ×10 IMPLANT
SUT VIC AB 0 CT1 8-18 (SUTURE) ×5
SUT VIC AB 3-0 SH 27 (SUTURE) ×2
SUT VIC AB 3-0 SH 27X BRD (SUTURE) ×4 IMPLANT
SUT VICRYL 0 TIES 12 18 (SUTURE) ×3 IMPLANT
SUT VICRYL 0 UR6 27IN ABS (SUTURE) ×6 IMPLANT
SYR 50ML LL SCALE MARK (SYRINGE) ×3 IMPLANT
SYR BULB IRRIGATION 50ML (SYRINGE) ×3 IMPLANT
SYR CONTROL 10ML LL (SYRINGE) IMPLANT
TIP UTERINE 5.1X6CM LAV DISP (MISCELLANEOUS) IMPLANT
TIP UTERINE 6.7X10CM GRN DISP (MISCELLANEOUS) IMPLANT
TIP UTERINE 6.7X6CM WHT DISP (MISCELLANEOUS) IMPLANT
TIP UTERINE 6.7X8CM BLUE DISP (MISCELLANEOUS) ×6 IMPLANT
TOWEL OR 17X24 6PK STRL BLUE (TOWEL DISPOSABLE) ×12 IMPLANT
TRAY FOLEY CATH 14FR (SET/KITS/TRAYS/PACK) ×6 IMPLANT
TROCAR BALLN 12MMX100 BLUNT (TROCAR) ×3 IMPLANT
TROCAR XCEL NON-BLD 11X100MML (ENDOMECHANICALS) ×6 IMPLANT
TROCAR XCEL NON-BLD 5MMX100MML (ENDOMECHANICALS) ×3 IMPLANT
TUBING FILTER THERMOFLATOR (ELECTROSURGICAL) ×3 IMPLANT
WARMER LAPAROSCOPE (MISCELLANEOUS) ×3 IMPLANT
WATER STERILE IRR 1000ML POUR (IV SOLUTION) ×6 IMPLANT

## 2014-01-06 NOTE — H&P (View-Only) (Signed)
Tiffany [name] is a 38 y.o. female P 2-1-2-4 presents for hysterectomy because of  menorrhagia.  For many years the patient used the Mirena IUD for contracepiton that yielded her amenorrheic Once she had it removed, following tubal sterilzation with Essure,  she began to bleed regularly and eventually twice a month for five days.  She has to change her pad every 1-1.5 hours and is accompanied by the passage of large clots.  She reports cramps that are rated at 8/10 on a 10 point pain scale with some relief form Ibuprofen (3/10) and complete relief with Percocet.  She was placed on Lysteda as a means of curtailing her bleeding but she found the results sub-optimal.  She was also prescribed Provera but has yet to try them. She denies changes in bowel and bladder function but admits to dyspareunia that is insertional.  In May 2015 an endometrial biopsy returned benign findings, H/H= 11.5/35.2 and TSH was normal.  A pelvic ultrasound revealed a uterus: 6.54 x 6.09 x 4.82 cm,  endometrium-0.464 cm, right ovary-3.43 x 1.54 x 1.27 cm and left ovary-2.87 x 1.71 x 1.00 cm.  A review of both medical and surgical management options were given to the patient however, after careful consideration the patient has decided to proceed with definitive therapy in the form of hysterectomy.  Past Medical History  OB History: G: 5;  P: 2-1-2-4;  SVB 1995 and 1999  largest infant 8 lbs. 4 oz.  and  2003 (twins) by C-section  GYN History: menarche: 38 YO    LMP: 12/17/13;  Contracepton Essure Tubal Sterilization  The patient reports a past history of: chlamydia and HPV.  Has a  history of abnormal PAP smear that was treated with conization in 1999;   Last PAP smear: April 2015-normal  Medical History: Vitamin D Deficiency, Umbilical Hernia, Anxiety, Depression and Migraine  Surgical History: 1999  Cold Knife Cervical Conization;  2002 Appendectomy;   2013  Essure Tubal Sterilization Denies problems with anesthesia or history of  blood transfusions  Family History: Heart Disease, Hypertension and Thyroid Disease  Social History: Married and employed with [employer];  Denies tobacco and alcohol use   Medication Clobetasol 0.05%  Cream  bid prn Clonazepam 0.5 mg daily Doxycycline 100 mg  daily Provera 10 mg  daily prn  Allergies  Allergen Reactions  . Mango Flavor Anaphylaxis and Swelling  . Penicillins Other (See Comments)    unknown    Denies sensitivity to peanuts, shellfish, soy, latex or adhesives.    ROS: Admits to glasses and occasional migraine headache but denies vision changes, nasal congestion, dysphagia, tinnitus, dizziness, hoarseness, cough,  chest pain, shortness of breath, nausea, vomiting, diarrhea,constipation,  urinary frequency, urgency  dysuria, hematuria, vaginitis symptoms, pelvic pain, swelling of joints,easy bruising,  myalgias, arthralgias, skin rashes, unexplained weight loss and except as is mentioned in the history of present illness, patient's review of systems is otherwise negative.    Physical Exam  Bp: 110/70   P: 80   R: 20   Temperature: 98.2 degrees F orally      Weight: 237 lbs.   Height: 5'7"   BMI: 37.1   Neck: supple without masses or thyromegaly Lungs: clear to auscultation Heart: regular rate and rhythm Abdomen: soft, non-tender and no organomegaly Pelvic:EGBUS- wnl; vagina-normal rugae; uterus-normal size, cervix without lesions or motion tenderness; adnexae-no tenderness or masses Extremities:  no clubbing, cyanosis or edema   Assesment: Menorrhagia   Disposition:  A discussion was  held with patient regarding the indication for her procedure(s) along with the risks, which include but are not limited to: reaction to anesthesia, damage to adjacent organs, infection and excessive bleeding. The patient verbalized understanding of these risks and has consented to proceed with a Total Laparoscopic Hysterectomy with Bilateral Salpingectomy and Cystoscopy  with Possible Laparoscopically Assisted Vaginal Hysterectomy and Possible Total Abdominal Hysterectomy at [place] on January 06, 2014.  CSN# [id number]   Joachim Carton J. Florene Glen, PA-C  for Dr. Harvie Bridge. Mancel Bale

## 2014-01-06 NOTE — Op Note (Addendum)
Preop Diagnosis: Menorrhagia   Postop Diagnosis: Menorrhagia   Procedure: Converted Laparoscopic Hysterectomy to Abdominal Hysterectomy   Anesthesia: General   Attending: Delice Lesch, MD   Assistant: Earnstine Regal, PA-C  Intraop Consult: Alphonsa Overall, MD  Findings: Omental and Bowel Adhesions to Anterior Abdominal Wall beneath umbilicus  Pathology: Uterus, cervix (wt 121.0g) and bilateral fallopian tubes   Fluids: 3400 cc  UOP: 300 cc  EBL: 193 cc  Complications: ? Serosal Bowel tear   Procedure: The patient was taken to the operating room, placed under general anesthesia and prepped and draped in the normal sterile fashion. A time out was performed.  A Foley catheter was placed in the bladder.  A weighted speculum and vaginal retractors were placed in the vagina.  Tenaculum was placed on the anterior lip of the cervix. The uterus sounded to 9.5cm.  A size 8cm tip was used and the rumi was placed, tip balloon and occluder insufflated. Attention was then turned to the abdomen. A 10 mm infraumbilical incision was made with the scalpel after 5 cc of 0.25% percent Marcaine was used for local anesthesia. The Veress needle was placed in the intra-abdominal cavity and insufflation obtained.  A 10 mm trochar was advanced into the intra-abdominal cavity and the laparoscope was introduced and multiple adhesions to anterior abdominal wall noted such that trochar could not even see a clear area.  Decision was made to perform laparotomy.  The fascia at the umbilicus was repaired with a purse string stitch of 0 vicryl after fascial incision was extended less than half a centimeter in order to inspect the area with increased visualization.  Loop of bowel noted in adhesions.  Skin was repaired with 3-0 monocryl.  Attention was turned to the abdomen and a Pfannensteil skin incision was made. This incision was taken down to the fascia using electrocautery with care given to maintain good  hemostasis. The fascia was incised in the midline and the fascial incision was then extended bilaterally using electrocautery without difficulty. The fascia was then dissected off the underlying rectus muscles using blunt and sharp dissection. The rectus muscles were split bluntly in the midline and the peritoneum entered sharply without complication. This peritoneal incision was then extended superiorly and inferiorly with care given to prevent bowel or bladder injury. Attention was then turned to the pelvis. A retractor was placed into the incision, and the bowel was packed away with moist laparotomy sponges. The uterus at this point was noted to be mobilized and was delivered up out of the abdomen.  The bowel was packed away with moist laparotomy sponges. The round ligaments on each side were clamped, suture ligated with 0 Vicryl, and transected with electrocautery allowing entry into the broad ligament. Of note, all sutures used in this procedure are 0 Vicryl unless otherwise noted.   The left fallopian tube was elevated with a babcock clamped immediately beneath it at the mesosalpinx, cut and suture ligated.  The same was done on the contralateral side.  A hole was created in the clear portion of the posterior broad ligament and theutero-ovarian ligament was clamped on the patient's left side, cut, and doubly suture ligated with good hemostasis.  This procedure was repeated in an identical fashion on the opposite side.  A bladder flap was then created.  The bladder was then bluntly dissected off the lower uterine segment and cervix with good hemostasis noted. The uterine arteries were then skeletonized bilaterally and then clamped, cut, and doubly suture ligated  with care given to prevent ureteral injury.  The uterus was then amputated across the lower uterine segment.  The uterosacral ligaments were then clamped, cut, and ligated bilaterally.  Finally, the cardinal ligaments were clamped, cut, and suture  ligated bilaterally.  Acutely curved clamps were placed across the vagina just under the cervix, and the specimen was amputated and sent to pathology. The vaginal cuff angles were closed with Heaney stiches with care given to incorporate the uterosacral-cardinal ligament pedicles on both sides. The middle of the vaginal cuff was closed with a series of interrupted figure-of-eight sutures with care given to incorporate the anterior pubocervical fascia and the posterior rectovaginal fascia.  The left fallopian tube was grasped with babcock and clamped with a Kelly, excised, tied with 0 vicryl and sutured with 0 vicryl as well.  The same was done to the contralateral side.  The pelvis was irrigated and hemostasis was reconfirmed at all pedicles and along the pelvic sidewall.  Surgicel was applied to right aspect of the cuff.  The area beneath the umbilicus was inspected and a loop of bowel was noted in the adhesions. There was an approximate half centimeter area with question of serosal bowel injury versus an adhesion.  General surgery consult was requested.  While awaiting surgeon, foley was removed, cystoscopy performed and bilateral efflux from ureters was noted with no inadvertent bladder injury.  Dr. Alphonsa Overall arrived, inspected area and oversewed the area with silk suture on the transverse colon and removed the omental adhesions from anterior abdominal wall (please see his note for details).  The abdomen was copiously irrigated again and good hemostasis was noted.  All laparotomy sponges and instruments were removed from the abdomen. The peritoneum was closed with a running stitch of 2-0 plain, and the fascia was closed in a running fashion 0 vicryl. The subcutaneous layer was reapproximated with 2-0 plain gut. The skin was closed with a 3-0 monocryl subcuticular stitch. Dermabond was applied in small sections along the length of the incision.  Sponge, lap, needle, and instrument counts were correct times two.  The patient was taken to the recovery area awake, extubated and in stable condition.

## 2014-01-06 NOTE — Interval H&P Note (Signed)
History and Physical Interval Note:  01/06/2014 1:08 PM  Tiffany [name]  has presented today for surgery, with the diagnosis of Menorrhagia  The various methods of treatment have been discussed with the patient and family. After consideration of risks, benefits and other options for treatment, the patient has consented to  Procedure(s): HYSTERECTOMY TOTAL LAPAROSCOPIC (N/A) BILATERAL SALPINGECTOMY, CYSTOSCOPY, POSSIBLE LAVH, POSSIBLE ABDOMINAL HYSTERECTOMY as a surgical intervention .  The patient's history has been reviewed, patient examined, no change in status, stable for surgery.  I have reviewed the patient's chart and labs.  Questions were answered to the patient's satisfaction.     Delice Lesch

## 2014-01-06 NOTE — Anesthesia Postprocedure Evaluation (Signed)
  Anesthesia Post Note  Patient: Tiffany [name]  Procedure(s) Performed: Procedure(s) (LRB): Attempted  TOTAL LAPAROSCOPIC Hysterectomy, (N/A) HYSTERECTOMY ABDOMINAL WITH BILATERAL SALPINGECTOMY  (Bilateral) OVERSEW OF SEROSA TEAR OF THE TRANSVERSE COLON  (N/A)  Anesthesia type: GA  Patient location: PACU  Post pain: Pain level controlled  Post assessment: Post-op Vital signs reviewed  Last Vitals:  Filed Vitals:   01/06/14 1830  BP: 118/53  Pulse: 74  Temp:   Resp: 17    Post vital signs: Reviewed  Level of consciousness: sedated  Complications: No apparent anesthesia complications

## 2014-01-06 NOTE — Transfer of Care (Signed)
Immediate Anesthesia Transfer of Care Note  Patient: Tiffany [name]  Procedure(s) Performed: Procedure(s): Attempted  TOTAL LAPAROSCOPIC Hysterectomy, (N/A) HYSTERECTOMY ABDOMINAL WITH BILATERAL SALPINGECTOMY  (Bilateral) OVERSEW OF SEROSA TEAR OF THE TRANSVERSE COLON  (N/A)  Patient Location: PACU  Anesthesia Type:General  Level of Consciousness: awake, alert  and oriented  Airway & Oxygen Therapy: Patient Spontanous Breathing and Patient connected to nasal cannula oxygen  Post-op Assessment: Report given to PACU RN and Post -op Vital signs reviewed and stable  Post vital signs: Reviewed and stable  Complications: No apparent anesthesia complications

## 2014-01-06 NOTE — Anesthesia Preprocedure Evaluation (Signed)
Anesthesia Evaluation  Patient identified by MRN, date of birth, ID band Patient awake    Reviewed: Allergy & Precautions, H&P , Patient's Chart, lab work & pertinent test results, reviewed documented beta blocker date and time   History of Anesthesia Complications Negative for: history of anesthetic complications  Airway Mallampati: II  TM Distance: >3 FB Neck ROM: full    Dental   Pulmonary  breath sounds clear to auscultation        Cardiovascular Exercise Tolerance: Good Rhythm:regular Rate:Normal     Neuro/Psych PSYCHIATRIC DISORDERS Anxiety Depression    GI/Hepatic   Endo/Other    Renal/GU      Musculoskeletal   Abdominal   Peds  Hematology  (+) anemia ,   Anesthesia Other Findings   Reproductive/Obstetrics                             Anesthesia Physical Anesthesia Plan  ASA: II  Anesthesia Plan: General ETT   Post-op Pain Management:    Induction:   Airway Management Planned:   Additional Equipment:   Intra-op Plan:   Post-operative Plan:   Informed Consent: I have reviewed the patients History and Physical, chart, labs and discussed the procedure including the risks, benefits and alternatives for the proposed anesthesia with the patient or authorized representative who has indicated his/her understanding and acceptance.   Dental Advisory Given  Plan Discussed with: CRNA and Surgeon  Anesthesia Plan Comments:         Anesthesia Quick Evaluation

## 2014-01-07 ENCOUNTER — Encounter (HOSPITAL_COMMUNITY): Payer: Self-pay | Admitting: Obstetrics and Gynecology

## 2014-01-07 DIAGNOSIS — M79609 Pain in unspecified limb: Secondary | ICD-10-CM

## 2014-01-07 LAB — CBC
HCT: 32.6 % — ABNORMAL LOW (ref 36.0–46.0)
Hemoglobin: 10.4 g/dL — ABNORMAL LOW (ref 12.0–15.0)
MCH: 24.7 pg — ABNORMAL LOW (ref 26.0–34.0)
MCHC: 31.9 g/dL (ref 30.0–36.0)
MCV: 77.4 fL — ABNORMAL LOW (ref 78.0–100.0)
Platelets: 341 10*3/uL (ref 150–400)
RBC: 4.21 MIL/uL (ref 3.87–5.11)
RDW: 16.6 % — ABNORMAL HIGH (ref 11.5–15.5)
WBC: 15.4 10*3/uL — AB (ref 4.0–10.5)

## 2014-01-07 NOTE — Consult Note (Signed)
[name], Tiffany [name] NO.:  [id number]  MEDICAL RECORD NO.:  [id number]  LOCATION:  9304                          FACILITY:  [facility]  PHYSICIAN:  Fenton Malling. Lucia Gaskins, M.D.  DATE OF BIRTH:  [birth year]  DATE OF CONSULTATION:  01/06/2014                                CONSULTATION   PREOPERATIVE DIAGNOSIS:  Serosal tear of bowel.  POSTOPERATIVE DIAGNOSIS:  Serosal tear of transverse colon and omentum attached to the anterior peritoneal surface.  PROCEDURES:  Oversew of serosal tear and enterolysis of adhesions to anterior peritoneal surface.  SURGEON:  Fenton Malling. Lucia Gaskins, M.D.  FIRST ASSISTANT:  Everett Graff, M.D.  ESTIMATED BLOOD LOSS:  Minimal.  ANESTHESIA:  General endotracheal.  INDICATION FOR PROCEDURE:  [name] is a 38 year old female who has undergone a hysterectomy and bilateral salpingectomy by Dr. Everett Graff.  She was concerned about an injury to the bowel and asked for General Surgery intraoperative consultation.  The patient was in room #8 at [facility] NOTE: The patient is under general anesthesia.  Her abdomen is prepped and draped.  She has a Pfannenstiel incision and I carried out an abdominal exploration.  The patient had a wad of omentum attached to the anterior peritoneal surface.  Per Dr. Mancel Bale' history, she thought she had a prior umbilical hernia repair and this may be a cause of these adhesions to the anterior abdominal wall.  I took these adhesions down.   Then in the transverse colon, she had what appeared to be a tear in the serosa.  I oversewed this with 3-0 silk sutures x 3.  I was able to visualize the transverse colon which was otherwise looks okay.  I ran the small bowel from the terminal ileum back, about half way back up into the upper abdomen.  My exploration was limited because of the  Pfannenstiel incision, I could not run the entire small bowel, but certainly what I could see the bowel looked viable without  injury.  There was no obvious injury.  There were a few interloop adhesions, which were not significant and not taken down.  I did re-visualize her vaginal cuff.  There was no bleeding from the vaginal cuff.  We identified both the left and right ovaries, which appeared okay.  At this time, I broke scrub.  Dr. Mancel Bale will do the closure of the abdominal cavity, and she will dictate her portion of the procedure.  I will expect [name] to have a normal postoperative course, in which Dr. Mancel Bale said normally patients are in the hospital about 2-4 days. I do not see reason for any special restrictions for what I saw intraoperatively.   Fenton Malling. Lucia Gaskins, M.D., [facility], Secretary for [employer]   DHN/MEDQ  D:  01/06/2014  T:  01/07/2014  Job:  [id number]  cc:   Everett Graff, M.D. Fax: [phone]

## 2014-01-07 NOTE — Addendum Note (Signed)
Addendum  created 01/07/14 0933 by Asher Muir, CRNA   Modules edited: Notes Section   Notes Section:  File: [id number]

## 2014-01-07 NOTE — Progress Notes (Signed)
Tiffany [name] is a61 y.o.  [id number]  Post Op Date #1:  Attemped Laparoscopy/Total Abdominal Hysterectomy/Bilateral Salpingectomy/ Transverse Colon Serosal Repair and Take Down of Umbilical Adhesions  Subjective: Patient is Doing well postoperatively. Patient has minimal pain, still using PCA Dilaudid and Toradol every 6 hours., ambulating in halls with slight lightheadedness, tolerating liquids and crackers (states she's hungry).  Hasn't voided yet, Foley just removed. Hasn't passed flatus.   Objective: Vital signs in last 24 hours: Temp:  [97.7 F (36.5 C)-98.5 F (36.9 C)] 98.5 F (36.9 C) (12/16 0123) Pulse Rate:  [59-83] 59 (12/16 0558) Resp:  [14-21] 19 (12/16 0558) BP: (112-128)/(47-72) 113/64 mmHg (12/16 0558) SpO2:  [96 %-100 %] 98 % (12/16 0558) Weight:  [235 lb (106.595 kg)] 235 lb (106.595 kg) (12/15 1957)  Intake/Output from previous day: 12/15 0701 - 12/16 0700 In: 5130.1 [P.O.:600; I.V.:4480.1] Out: 1900 [Urine:1600] Intake/Output this shift:    Recent Labs Lab 01/05/14 1325 01/07/14 0518  WBC 8.6 15.4*  HGB 12.0 10.4*  HCT 36.7 32.6*  PLT 370 341    No results for input(s): NA, K, CL, CO2, BUN, CREATININE, CALCIUM, PROT, BILITOT, ALKPHOS, ALT, AST, GLUCOSE in the last 168 hours.  Invalid input(s): LABALBU  EXAM: General: alert, cooperative and no distress Resp: clear to auscultation bilaterally Cardio: regular rate and rhythm, S1, S2 normal, no murmur, click, rub or gallop GI:  Soft, bowel sounds present, dressings are clean/dry/intact Extremities: SCD hose in place and functioning;  negative Homan's   Assessment: s/p Procedure(s): Attempted  TOTAL LAPAROSCOPIC Hysterectomy, HYSTERECTOMY ABDOMINAL WITH BILATERAL SALPINGECTOMY  OVERSEW OF SEROSA TEAR OF THE TRANSVERSE COLON : stable, progressing well and anemia  Plan: Advance diet Encourage ambulation Advance to PO medication Routine care  LOS: 1 day    POWELL,ELMIRA, PA-C 01/07/2014 7:20  AM  Agree with above.  Pt seen at lunch time and was doing well except c/o rt calf tenderness.  Pt spontaneously voiding, tolerating po and ambulating without difficulty.  A doppler of the right LE was done and verbal report is negative.  Will cont to observe.

## 2014-01-07 NOTE — Progress Notes (Signed)
*  PRELIMINARY RESULTS* Vascular Ultrasound Right lower extremity venous duplex has been completed.  Preliminary findings: no evidence of DVT  Landry Mellow, RDMS, RVT  01/07/2014, 3:28 PM

## 2014-01-07 NOTE — Anesthesia Postprocedure Evaluation (Signed)
Anesthesia Post Note  Patient: Tiffany [name]  Procedure(s) Performed: Procedure(s) (LRB): Attempted  TOTAL LAPAROSCOPIC Hysterectomy, (N/A) HYSTERECTOMY ABDOMINAL WITH BILATERAL SALPINGECTOMY  (Bilateral) OVERSEW OF SEROSA TEAR OF THE TRANSVERSE COLON  (N/A)  Anesthesia type: General  Patient location: [facility]  Post pain: Pain level controlled  Post assessment: Post-op Vital signs reviewed  Last Vitals:  Filed Vitals:   01/07/14 0822  BP:   Pulse:   Temp:   Resp: 18    Post vital signs: Reviewed  Level of consciousness: sedated  Complications: No apparent anesthesia complications

## 2014-01-08 LAB — CBC WITH DIFFERENTIAL/PLATELET
BASOS PCT: 0 % (ref 0–1)
Basophils Absolute: 0 10*3/uL (ref 0.0–0.1)
Eosinophils Absolute: 0.3 10*3/uL (ref 0.0–0.7)
Eosinophils Relative: 3 % (ref 0–5)
HEMATOCRIT: 29.6 % — AB (ref 36.0–46.0)
HEMOGLOBIN: 9.5 g/dL — AB (ref 12.0–15.0)
LYMPHS ABS: 1.8 10*3/uL (ref 0.7–4.0)
Lymphocytes Relative: 20 % (ref 12–46)
MCH: 24.6 pg — ABNORMAL LOW (ref 26.0–34.0)
MCHC: 32.1 g/dL (ref 30.0–36.0)
MCV: 76.7 fL — ABNORMAL LOW (ref 78.0–100.0)
MONO ABS: 0.9 10*3/uL (ref 0.1–1.0)
MONOS PCT: 10 % (ref 3–12)
NEUTROS ABS: 6.1 10*3/uL (ref 1.7–7.7)
Neutrophils Relative %: 67 % (ref 43–77)
Platelets: 279 10*3/uL (ref 150–400)
RBC: 3.86 MIL/uL — ABNORMAL LOW (ref 3.87–5.11)
RDW: 16.7 % — ABNORMAL HIGH (ref 11.5–15.5)
WBC: 9.1 10*3/uL (ref 4.0–10.5)

## 2014-01-08 MED ORDER — IBUPROFEN 600 MG PO TABS: ORAL_TABLET | ORAL | Status: DC

## 2014-01-08 MED ORDER — OXYCODONE-ACETAMINOPHEN 5-325 MG PO TABS: 1.0000 | ORAL_TABLET | ORAL | Status: DC | PRN

## 2014-01-08 NOTE — Progress Notes (Signed)
Tiffany [name] is a58 y.o.  [id number]  Post Op Date # Converted Laparoscopic Hysterectomy to TAH/BS Subjective: Patient is reporting pain rated as 6/10 on a 10 point pain scale.  Is currently on Percocet #1 tablet and Ibuprofen 600 mg.  Continues soreness of right leg that she also states feels "tingly".   Venous Doppler Studies on yesterday were negative for DVT. Tolerating a regular diet, voiding and ambulating.   Objective: Vital signs in last 24 hours: Temp:  [99 F (37.2 C)-99.7 F (37.6 C)] 99.3 F (37.4 C) (12/17 0503) Pulse Rate:  [66-79] 72 (12/17 0503) Resp:  [18] 18 (12/17 0503) BP: (109-132)/(45-63) 117/63 mmHg (12/17 0503) SpO2:  [99 %-100 %] 99 % (12/17 0503)  Intake/Output from previous day: 12/16 0701 - 12/17 0700 In: 866.3 [P.O.:360; I.V.:506.3] Out: 1650 [Urine:1650] Intake/Output this shift:    Recent Labs Lab 01/05/14 1325 01/07/14 0518 01/08/14 0635  WBC 8.6 15.4* 9.1  HGB 12.0 10.4* 9.5*  HCT 36.7 32.6* 29.6*  PLT 370 341 279    No results for input(s): NA, K, CL, CO2, BUN, CREATININE, CALCIUM, PROT, BILITOT, ALKPHOS, ALT, AST, GLUCOSE in the last 168 hours.  Invalid input(s): LABALBU  EXAM: General: alert, cooperative and mild distress Resp: clear to auscultation bilaterally Cardio: regular rate and rhythm, S1, S2 normal, no murmur, click, rub or gallop GI: BS present, dressings clean/dry/intact Extremities: Negative Homan's,  complains of mild tenderness with palpation of right leg.   Assessment: s/p Procedure(s): Attempted  TOTAL LAPAROSCOPIC Hysterectomy, HYSTERECTOMY ABDOMINAL WITH BILATERAL SALPINGECTOMY  OVERSEW OF SEROSA TEAR OF THE TRANSVERSE COLON : stable, progressing well, anemia and Sub-optimal Pain Management  Plan: Increase Percocet to #2 tablets and continue Ibuprofen as directed.  Routine care with probable discharge later today.  LOS: 2 days    POWELL,ELMIRA, PA-C 01/08/2014 8:37 AM  Agree with above.  No further leg  pain.  Ambulating well without difficulty.  Voiding spontaneously and tolerating po.

## 2014-01-08 NOTE — Discharge Summary (Signed)
Physician Discharge Summary  Patient ID: Tiffany [name] MRN: [id number] DOB/AGE: 1975/05/16 38 y.o.  Admit date: 01/06/2014 Discharge date: 01/08/2014   Discharge Diagnoses:  Menorrhagia and Umbilical Adhesions Active Problems:   Dysfunctional uterine bleeding   Operation: Converted Laparoscopic Hysterectomy to a Total Abdominal Hysterectomy, Lysis of Adhesions, Bilateral Salpingectomy, Repair of Transverse Colon Serosal Tear and Lysis of Umbilical Adhesions.   Discharged Condition: Good  Hospital Course: On the date of admission the patient underwent the aforementioned procedures and tolerated them well.  General Surgeon, Dr. Alphonsa Overall was in to repair a serosal tear of the transverse colon and to take down the dense  (bowel and omental) adhesions from the umbilicus.   Post operative course was marked by right lower extremity pain however a venous Doppler study did not reveal a DVT.  The patient went on to  have  difficulty with pain management.  A combination of Percocet and Ibuprofen given regularly however, resolved this issue. By post operative day #3 the patient had resumed bowel and bladder function and was therefore deemed ready for discharge home.  Discharge hemoglobin and hematocrit were 9.5/29.6 respectively.  Disposition: 01-Home or Self Care  Discharge Medications:    Medication List    STOP taking these medications        doxycycline 100 MG tablet  Commonly known as:  VIBRA-TABS      TAKE these medications        buPROPion 150 MG 24 hr tablet  Commonly known as:  WELLBUTRIN XL  Take 1 tablet (150 mg total) by mouth every morning.     clobetasol ointment 0.05 %  Commonly known as:  TEMOVATE  Apply 1 application topically 2 (two) times daily. To scalp     clonazePAM 0.5 MG tablet  Commonly known as:  KLONOPIN  Take 1 tablet (0.5 mg total) by mouth daily as needed for anxiety.     escitalopram 10 MG tablet  Commonly known as:  LEXAPRO  Take 0.5 tablets (5  mg total) by mouth daily.     ferrous sulfate 325 (65 FE) MG tablet  Take 325 mg by mouth 2 (two) times daily with a meal.     ibuprofen 600 MG tablet  Commonly known as:  ADVIL,MOTRIN  1   po  pc every 6 hours for 5 days then prn-pain     multivitamin capsule  Take 1 capsule by mouth daily.     oxyCODONE-acetaminophen 5-325 MG per tablet  Commonly known as:  PERCOCET/ROXICET  Take 1-2 tablets by mouth every 4 (four) hours as needed for severe pain (moderate to severe pain (when tolerating fluids)).         Discharge Instructions: Call with fever, chills, nausea, pain uncontrolled with meds, nothing per vagina x 6 weeks  Follow-up: Dr. Harvie Bridge. Mancel Bale,  February 13, 2014 at 10 a.m.  SignedEarnstine Regal , PA-C  01/08/2014, 9:05 AM

## 2014-01-09 NOTE — Discharge Instructions (Signed)
Call [facility] @ [phone] if:  You have a temperature greater than or equal to 100.4 degrees Farenheit orally You have pain that is not made better by the pain medication given and taken as directed You have excessive bleeding or problems urinating  Take Colace (Docusate Sodium/Stool Softener) 100 mg 2-3 times daily while taking narcotic pain medicine to avoid constipation or until bowel movements are regular. Take Ibuprofen with food every 6 hours for 5 days then as needed for pain Take over the counter iron supplement of your choice,  twice daily for the next 12 weeks May apply heating pad (set on low) to abdomen, on top of clothes for comfort  You may drive after 2 weeks You may walk up steps You may shower   You may resume a regular diet Keep incisions clean and dry  Do not lift over 15 pounds for 6 weeks Avoid anything in vagina for 6 weeks (or until after your post-operative visit)

## 2014-01-09 NOTE — Progress Notes (Signed)
Teaching complete  Pt out in wheelchair  With husband

## 2014-01-09 NOTE — Progress Notes (Signed)
Tiffany [name] is a73 y.o.  [id number]  Post Op Date # 3: Converted Laparoscopic Hysterectomy to Total Abdominal Hysterectomy with Bilateral Salpingectomy  Subjective: Patient is Doing well postoperatively. Patient has Pain is controlled with current analgesics. Medications being used: Percocet and Ibuprofen. Has finally achieved good pain management with the combination medications taken regularly. Voiding, tolerating regular diet and ambulating without difficulty.  Objective: Vital signs in last 24 hours: Temp:  [98.2 F (36.8 C)-99.3 F (37.4 C)] 98.2 F (36.8 C) (12/18 0529) Pulse Rate:  [60-72] 72 (12/18 0529) Resp:  [16-18] 16 (12/18 0529) BP: (118-126)/(63-68) 119/68 mmHg (12/18 0529) SpO2:  [100 %] 100 % (12/18 0529)  Intake/Output from previous day:   Intake/Output this shift:    Recent Labs Lab 01/05/14 1325 01/07/14 0518 01/08/14 0635  WBC 8.6 15.4* 9.1  HGB 12.0 10.4* 9.5*  HCT 36.7 32.6* 29.6*  PLT 370 341 279    No results for input(s): NA, K, CL, CO2, BUN, CREATININE, CALCIUM, PROT, BILITOT, ALKPHOS, ALT, AST, GLUCOSE in the last 168 hours.  Invalid input(s): LABALBU  EXAM: General: alert, cooperative and no distress Resp: clear to auscultation bilaterally Cardio: regular rate and rhythm, S1, S2 normal, no murmur, click, rub or gallop GI: Bowel sounds present, dressings clean/dry/intact Extremities: Negative Homan's sign with tenderness to palpation of right leg only Vaginal Bleeding: none   Assessment: s/p Procedure(s): Attempted  TOTAL LAPAROSCOPIC Hysterectomy, HYSTERECTOMY ABDOMINAL WITH BILATERAL SALPINGECTOMY  OVERSEW OF SEROSA TEAR OF THE TRANSVERSE COLON : stable and progressing well  Plan: Discharge home  LOS: 3 days    Tiffany Landgrebe, PA-C 01/09/2014 7:58 AM

## 2014-01-23 HISTORY — PX: ABDOMINAL HYSTERECTOMY: SHX81

## 2014-01-28 ENCOUNTER — Inpatient Hospital Stay (HOSPITAL_COMMUNITY)
Admission: AD | Admit: 2014-01-28 | Discharge: 2014-01-28 | Disposition: A | Discharge status: Home / Self Care | Payer: Federal, State, Local not specified - PPO | Source: Ambulatory Visit | Attending: Obstetrics and Gynecology | Admitting: Obstetrics and Gynecology

## 2014-01-28 ENCOUNTER — Encounter (HOSPITAL_COMMUNITY): Payer: Self-pay | Admitting: *Deleted

## 2014-01-28 DIAGNOSIS — G8918 Other acute postprocedural pain: Secondary | ICD-10-CM | POA: Insufficient documentation

## 2014-01-28 DIAGNOSIS — L293 Anogenital pruritus, unspecified: Secondary | ICD-10-CM | POA: Diagnosis not present

## 2014-01-28 DIAGNOSIS — Z90710 Acquired absence of both cervix and uterus: Secondary | ICD-10-CM | POA: Insufficient documentation

## 2014-01-28 DIAGNOSIS — R109 Unspecified abdominal pain: Secondary | ICD-10-CM | POA: Insufficient documentation

## 2014-01-28 LAB — WET PREP, GENITAL
Clue Cells Wet Prep HPF POC: NONE SEEN
Trich, Wet Prep: NONE SEEN

## 2014-01-28 LAB — URINALYSIS, ROUTINE W REFLEX MICROSCOPIC
Bilirubin Urine: NEGATIVE
GLUCOSE, UA: NEGATIVE mg/dL
Hgb urine dipstick: NEGATIVE
Ketones, ur: NEGATIVE mg/dL
LEUKOCYTES UA: NEGATIVE
NITRITE: NEGATIVE
PH: 6 (ref 5.0–8.0)
PROTEIN: NEGATIVE mg/dL
Specific Gravity, Urine: 1.01 (ref 1.005–1.030)
UROBILINOGEN UA: 0.2 mg/dL (ref 0.0–1.0)

## 2014-01-28 MED ORDER — OXYCODONE-ACETAMINOPHEN 5-325 MG PO TABS: 1.0000 | ORAL_TABLET | Freq: Four times a day (QID) | ORAL | Status: DC | PRN

## 2014-01-28 MED ORDER — KETOROLAC TROMETHAMINE 30 MG/ML IJ SOLN
30.0000 mg | Freq: Once | INTRAMUSCULAR | Status: AC
  Administered 2014-01-28: 30 mg via INTRAMUSCULAR
  Filled 2014-01-28: qty 1

## 2014-01-28 MED ORDER — FLUCONAZOLE 150 MG PO TABS: 150.0000 mg | ORAL_TABLET | Freq: Every day | ORAL | Status: DC

## 2014-01-28 MED ORDER — IBUPROFEN 800 MG PO TABS: 800.0000 mg | ORAL_TABLET | Freq: Three times a day (TID) | ORAL | Status: DC | PRN

## 2014-01-28 NOTE — MAU Note (Signed)
PT  SAYS SHE HAD  SURGERY  ON 01-06-2014 -  PAIN HAS BEEN    BEARABLE.   LAST WEEK  WENT TO  DR FOR PAIN  MED-  GAVE  HER PERCOCET-  BUT DOES NOT LIKE  IT .  YESTERDAY-  PAIN BECAME INTENSE ON INSIDE  -  ESP  WHEN  SHE VOIDS  AND HAS BURNING IN UPPER ABD.Marland Kitchen

## 2014-01-28 NOTE — MAU Provider Note (Signed)
History    Tiffany [name] is a 39 y.o. Female who is s/p a Total Abdominal Hysterectomy/Bilateral Salpingectomy/ Transverse Colon Serosal Repair and Take Down of Umbilical Adhesions on 54/00/8676.  Patient presents today with complaint of abdominal and pelvic pain and vaginal itching. Patient states pain started getting "really really bad yesterday," but states is worse tonight.  Husband states that patient has been doing extended amount of ambulation and not eating properly ("lots of fast food"). Patient denies vomiting, but has had nausea x 2 days.  Reports bowel movement today without issues, but admits to taking two colace daily. She reports taking one percocet and one ibuprofen 600mg  daily since the surgery and admits that she still experienced pain with this dosing. Patient reports since Percocet is "running out" she has only been taking 1/2 pill daily with no benefit.  Patient denies chills and reports mild fever two days ago of 99.6, but none since.   Patient states that vaginal itching begin "after they put me on those antibiotics," but denies vaginal discharge.  Patient reports pain with urination stating "I feel a really intense tightness when I go and I was thinking it was from the catheter."  Patient further describes it as spasm.  However, she presented with same symptoms and was treated with Bactrim on 01/20/14.  Patient Active Problem List   Diagnosis Date Noted  . Dysfunctional uterine bleeding 01/06/2014  . Ventral hernia 05/09/2013    No chief complaint on file.  HPI  OB History    Gravida Para Term Preterm AB TAB SAB Ectopic Multiple Living   4    1  1   4       Past Medical History  Diagnosis Date  . Anxiety   . Depression   . Umbilical hernia   . Anemia     Past Surgical History  Procedure Laterality Date  . Cesarean section      2003  . Tubal ligation      2013  . Appendectomy      2002  . Laparoscopic hysterectomy N/A 01/06/2014    Procedure: Attempted  TOTAL  LAPAROSCOPIC Hysterectomy,;  Surgeon: Delice Lesch, MD;  Location: [facility];  Service: Gynecology;  Laterality: N/A;  . Abdominal hysterectomy Bilateral 01/06/2014    Procedure: HYSTERECTOMY ABDOMINAL WITH BILATERAL SALPINGECTOMY ;  Surgeon: Delice Lesch, MD;  Location: [facility];  Service: Gynecology;  Laterality: Bilateral;  . Transverse colon resection N/A 01/06/2014    Procedure: OVERSEW OF SEROSA TEAR OF THE TRANSVERSE COLON ;  Surgeon: Alphonsa Overall, MD;  Location: [facility];  Service: General;  Laterality: N/A;    Family History  Problem Relation Age of Onset  . Heart disease Mother 14    heart attack  . Depression Mother   . Hypertension Father 27    History  Substance Use Topics  . Smoking status: Never Smoker   . Smokeless tobacco: Never Used  . Alcohol Use: No    Allergies:  Allergies  Allergen Reactions  . Mango Flavor Anaphylaxis and Swelling  . Penicillins Other (See Comments)    unknown    Prescriptions prior to admission  Medication Sig Dispense Refill Last Dose  . buPROPion (WELLBUTRIN XL) 150 MG 24 hr tablet Take 1 tablet (150 mg total) by mouth every morning. 30 tablet 1 01/27/2014 at Unknown time  . clobetasol ointment (TEMOVATE) 1.95 % Apply 1 application topically 2 (two) times daily. To scalp   Past Week at Unknown time  .  clonazePAM (KLONOPIN) 0.5 MG tablet Take 1 tablet (0.5 mg total) by mouth daily as needed for anxiety. 30 tablet 0 01/27/2014 at Unknown time  . escitalopram (LEXAPRO) 10 MG tablet Take 0.5 tablets (5 mg total) by mouth daily. 15 tablet 1 01/27/2014 at Unknown time  . ferrous sulfate 325 (65 FE) MG tablet Take 325 mg by mouth 2 (two) times daily with a meal.   01/27/2014 at Unknown time  . ibuprofen (ADVIL,MOTRIN) 600 MG tablet 1   po  pc every 6 hours for 5 days then prn-pain 30 tablet 1 01/27/2014 at Unknown time  . Multiple Vitamin (MULTIVITAMIN) capsule Take 1 capsule by mouth daily.   01/27/2014 at Unknown time  . oxyCODONE-acetaminophen  (PERCOCET/ROXICET) 5-325 MG per tablet Take 1-2 tablets by mouth every 4 (four) hours as needed for severe pain (moderate to severe pain (when tolerating fluids)). 45 tablet 0 Past Week at Unknown time    ROS  See HPI Above Physical Exam   Blood pressure 127/75, pulse 70, temperature 98.8 F (37.1 C), temperature source Oral, resp. rate 18, height 5\' 8"  (1.727 m), weight 240 lb 2 oz (108.92 kg).  Physical Exam  Constitutional: She appears well-developed and well-nourished.  HENT:  Head: Normocephalic and atraumatic.  Cardiovascular: Normal rate, regular rhythm and normal heart sounds.   Respiratory: Effort normal and breath sounds normal.  GI: Soft. Bowel sounds are normal. She exhibits distension. There is tenderness. There is guarding. There is no CVA tenderness.  Steri strips remained in place-removed   Genitourinary: Rectal exam shows no external hemorrhoid. Vaginal discharge found.  Speculum exam reveals thick cottage cheese like discharge.  Vaginal cuff intact and appears to be healing well.  Pain elicited on vaginal floor during manual exam.    Musculoskeletal: Normal range of motion.  Neurological: She is alert.  Skin: Skin is warm and dry.  Incisional site unremarkable.   Psychiatric: She has a normal mood and affect. Her behavior is normal.    ED Course  Assessment: 39 y.o. Female S/P Hysterectomy Pain Vaginal Itching  Plan: -PE as above -Labs: UA, Wet prep -Discussed post-op activity and rehabilitation  Follow Up (0250) -Wet prep: + Yeast -Toradol IM -Rx for Ibuprofen 800mg  Q8hrs prn -RX for percocet 5/325 Q6hrs prn -Diflucan 150mg  #2--Instructed to take one now and repeat in 48hrs if necessary -Discussed pain and medication management  -Keep appt as scheduled: 02/13/2014 -Encouraged to call if any questions or concerns arise prior to next scheduled office visit.  -Discharged to home in stable condition.   Tobey Lippard LYNN CNM, MSN 01/28/2014 2:14  AM

## 2014-01-28 NOTE — Discharge Instructions (Signed)
Pain Relief Preoperatively and Postoperatively °Being a good patient does not mean being a silent one. If you have questions, problems, or concerns about the pain you may feel after surgery, let your caregiver know. Patients have the right to assessment and management of pain. The treatment of pain after surgery is important to speed up recovery and return to normal activities. Severe pain after surgery, and the fear or anxiety associated with that pain, may cause extreme discomfort that: °· Prevents sleep. °· Decreases the ability to breathe deeply and cough. This can cause pneumonia or other upper airway infections. °· Causes your heart to beat faster and your blood pressure to be higher. °· Increases the risk for constipation and bloating. °· Decreases the ability of wounds to heal. °· May result in depression, increased anxiety, and feelings of helplessness. °Relief of pain before surgery is also important because it will lessen the pain after surgery. Patients who receive both pain relief before and after surgery experience greater pain relief than those who only receive pain relief after surgery. Let your caregiver know if you are having uncontrolled pain. This is very important. Pain after surgery is more difficult to manage if it is permitted to become severe, so prompt and adequate treatment of acute pain is necessary. °PAIN CONTROL METHODS °Your caregivers follow policies and procedures about the management of patient pain. These guidelines should be explained to you before surgery. Plans for pain control after surgery must be mutually decided upon and instituted with your full understanding and agreement. Do not be afraid to ask questions regarding the care you are receiving. There are many different ways your caregivers will attempt to control your pain, including the following methods. °As needed pain control °· You may be given pain medicine either through your intravenous (IV) tube, or as a pill or  liquid you can swallow. You will need to let your caregiver know when you are having pain. Then, your caregiver will give you the pain medicine ordered for you. °· Your pain medicine may make you constipated. If constipation occurs, drink more liquids if you can. Your caregiver may have you take a mild laxative. °IV patient-controlled analgesia pump (PCA pump) °· You can get your pain medicine through the IV tube which goes into your vein. You are able to control the amount of pain medicine that you get. The pain medicine flows in through an IV tube and is controlled by a pump. This pump gives you a set amount of pain medicine when you push the button hooked up to it. Nobody should push this button but you or someone specifically assigned by you to do so. It is set up to keep you from accidentally giving yourself too much pain medicine. You will be able to start using your pain pump in the recovery room after your surgery. This method can be helpful for most types of surgery. °· If you are still having too much pain, tell your caregiver. Also, tell your caregiver if you are feeling too sleepy or nauseous. °Continuous epidural pain control °· A thin, soft tube (catheter) is put into your back. Pain medicine flows through the catheter to lessen pain in the part of your body where the surgery is done. Continuous epidural pain control may work best for you if you are having surgery on your chest, abdomen, hip area, or legs. The epidural catheter is usually put into your back just before surgery. The catheter is left in until you can eat and take medicine by mouth. In most cases,   this may take 2 to 3 days.  Giving pain medicine through the epidural catheter may help you heal faster because:  Your bowel gets back to normal faster.  You can get back to eating sooner.  You can be up and walking sooner. Medicine that numbs the area (local anesthetic)  You may receive an injection of pain medicine near where the  pain is (local infiltration).  You may receive an injection of pain medicine near the nerve that controls the sensation to a specific part of the body (peripheral nerve block).  Medicine may be put in the spine to block pain (spinal block). Opioids  Moderate to moderately severe acute pain after surgery may respond to opioids.Opioids are narcotic pain medicine. Opioids are often combined with non-narcotic medicines to improve pain relief, diminish the risk of side effects, and reduce the chance of addiction.  If you follow your caregiver's directions about taking opioids and you do not have a history of substance abuse, your risk of becoming addicted is exceptionally small.Opioids are given for short periods of time in careful doses to prevent addiction. Other methods of pain control include:  Steroids.  Physical therapy.  Heat and cold therapy.  Compression, such as wrapping an elastic bandage around the area of pain.  Massage. These various ways of controlling pain may be used together. Combining different methods of pain control is called multimodal analgesia. Using this approach has many benefits, including being able to eat, move around, and leave the hospital sooner. Document Released: 04/01/2002 Document Revised: 04/03/2011 Document Reviewed: 04/05/2010 [facility] Patient Information 2015 [place], [place]. This information is not intended to replace advice given to you by your health care provider. Make sure you discuss any questions you have with your health care provider. Abdominal Hysterectomy, Care After Refer to this sheet in the next few weeks. These instructions provide you with information on caring for yourself after your procedure. Your health care provider may also give you more specific instructions. Your treatment has been planned according to current medical practices, but problems sometimes occur. Call your health care provider if you have any problems or questions after  your procedure.  WHAT TO EXPECT AFTER THE PROCEDURE After your procedure, it is typical to have the following:  Pain.  Feeling tired.  Poor appetite.  Less interest in sex. HOME CARE INSTRUCTIONS  It takes 4-6 weeks to recover from this surgery. Make sure you follow all your health care provider's instructions. Home care instructions may include:  Take pain medicines only as directed by your health care provider. Do not take over-the-counter pain medicines without checking with your health care provider first.  Change your bandage as directed by your health care provider.  Return to your health care provider to have your sutures taken out.  Take showers instead of baths for 2-3 weeks. Ask your health care provider when it is safe to start showering.  Do not douche, use tampons, or have sexual intercourse for at least 6 weeks or until your health care provider says you can.   Follow your health care provider's advice about exercise, lifting, driving, and general activities.  Get plenty of rest and sleep.   Do not lift anything heavier than a gallon of milk (about 10 lb [4.5 kg]) for the first month after surgery.  You can resume your normal diet if your health care provider says it is okay.   Do not drink alcohol until your health care provider says you can.  If you are constipated, ask your health care provider if you can take a mild laxative.  Eating foods high in fiber may also help with constipation. Eat plenty of raw fruits and vegetables, whole grains, and beans.  Drink enough fluids to keep your urine clear or pale yellow.   Try to have someone at home with you for the first 1-2 weeks to help around the house.  Keep all follow-up appointments. SEEK MEDICAL CARE IF:   You have chills or fever.  You have swelling, redness, or pain in the area of your incision that is getting worse.   You have pus coming from the incision.   You notice a bad smell  coming from the incision or bandage.   Your incision breaks open.   You feel dizzy or light-headed.   You have pain or bleeding when you urinate.   You have persistent diarrhea.   You have persistent nausea and vomiting.   You have abnormal vaginal discharge.   You have a rash.   You have any type of abnormal reaction or develop an allergy to your medicine.   Your pain medicine is not helping.  SEEK IMMEDIATE MEDICAL CARE IF:   You have a fever and your symptoms suddenly get worse.  You have severe abdominal pain.  You have chest pain.  You have shortness of breath.  You faint.  You have pain, swelling, or redness of your leg.  You have heavy vaginal bleeding with blood clots. MAKE SURE YOU:  Understand these instructions.  Will watch your condition.  Will get help right away if you are not doing well or get worse. Document Released: 07/29/2004 Document Revised: 01/14/2013 Document Reviewed: 11/01/2012 [facility] Patient Information 2015 [place], [place]. This information is not intended to replace advice given to you by your health care provider. Make sure you discuss any questions you have with your health care provider.

## 2014-01-28 NOTE — MAU Note (Signed)
Pt states she has been in a whole bunch of pain. Pt states she is not sure if the pain is from the incision or the inside. Pt states she was told that her intestines where tangled up inside of her scar tissue. Pt states pain is radiating to thigh and her back. Pt states last week she went for the pain and the looked at the incision and was given  Additional medication for pain and UTI

## 2014-01-28 NOTE — Progress Notes (Signed)
Pt states she is started to feel better

## 2014-01-30 ENCOUNTER — Ambulatory Visit (HOSPITAL_COMMUNITY): Payer: Self-pay | Admitting: Psychiatry

## 2014-02-03 ENCOUNTER — Ambulatory Visit (HOSPITAL_COMMUNITY): Payer: Self-pay | Admitting: Licensed Clinical Social Worker

## 2014-02-10 ENCOUNTER — Ambulatory Visit (HOSPITAL_COMMUNITY): Payer: Self-pay | Admitting: Licensed Clinical Social Worker

## 2014-02-13 ENCOUNTER — Emergency Department (HOSPITAL_COMMUNITY)
Admission: EM | Admit: 2014-02-13 | Discharge: 2014-02-13 | Disposition: A | Discharge status: Home / Self Care | Payer: Federal, State, Local not specified - PPO | Attending: Emergency Medicine | Admitting: Emergency Medicine

## 2014-02-13 ENCOUNTER — Encounter (HOSPITAL_COMMUNITY): Payer: Self-pay | Admitting: Emergency Medicine

## 2014-02-13 DIAGNOSIS — R079 Chest pain, unspecified: Secondary | ICD-10-CM

## 2014-02-13 DIAGNOSIS — Z792 Long term (current) use of antibiotics: Secondary | ICD-10-CM | POA: Insufficient documentation

## 2014-02-13 DIAGNOSIS — Z79899 Other long term (current) drug therapy: Secondary | ICD-10-CM | POA: Diagnosis not present

## 2014-02-13 DIAGNOSIS — M79662 Pain in left lower leg: Secondary | ICD-10-CM | POA: Insufficient documentation

## 2014-02-13 DIAGNOSIS — D649 Anemia, unspecified: Secondary | ICD-10-CM | POA: Insufficient documentation

## 2014-02-13 DIAGNOSIS — F419 Anxiety disorder, unspecified: Secondary | ICD-10-CM | POA: Insufficient documentation

## 2014-02-13 DIAGNOSIS — R0789 Other chest pain: Secondary | ICD-10-CM | POA: Insufficient documentation

## 2014-02-13 DIAGNOSIS — M79605 Pain in left leg: Secondary | ICD-10-CM

## 2014-02-13 DIAGNOSIS — Z8719 Personal history of other diseases of the digestive system: Secondary | ICD-10-CM | POA: Diagnosis not present

## 2014-02-13 DIAGNOSIS — Z7952 Long term (current) use of systemic steroids: Secondary | ICD-10-CM | POA: Diagnosis not present

## 2014-02-13 DIAGNOSIS — Z880 Allergy status to penicillin: Secondary | ICD-10-CM | POA: Insufficient documentation

## 2014-02-13 DIAGNOSIS — M79661 Pain in right lower leg: Secondary | ICD-10-CM | POA: Insufficient documentation

## 2014-02-13 DIAGNOSIS — M79604 Pain in right leg: Secondary | ICD-10-CM

## 2014-02-13 DIAGNOSIS — F329 Major depressive disorder, single episode, unspecified: Secondary | ICD-10-CM | POA: Insufficient documentation

## 2014-02-13 NOTE — Discharge Instructions (Signed)
see your doctor as scheduled tomorrow (later today) for recheck

## 2014-02-13 NOTE — ED Provider Notes (Signed)
CSN: [id number]     Arrival date & time 02/13/14  0204 History   First MD Initiated Contact with Patient 02/13/14 [phone]     Chief Complaint  Patient presents with  . Chest Pain  . leg cramps      (Consider location/radiation/quality/duration/timing/severity/associated sxs/prior Treatment) Patient is a 39 y.o. female presenting with chest pain. The history is provided by the patient. No language interpreter was used.  Chest Pain Pain location:  L chest Pain quality: sharp   Associated symptoms: no fever   Associated symptoms comment:  She presents with complaint of sharp left chest pain under her left breast and mild shortness of breath. She also complains of bilateral lower extremity discomfort in bilateral lower extremities in her calves. She reports recent surgical procedure (hysterectomy) on Dec.15 and expresses concern for blood clots. No fever.    Past Medical History  Diagnosis Date  . Anxiety   . Depression   . Umbilical hernia   . Anemia    Past Surgical History  Procedure Laterality Date  . Cesarean section      2003  . Tubal ligation      2013  . Appendectomy      2002  . Laparoscopic hysterectomy N/A 01/06/2014    Procedure: Attempted  TOTAL LAPAROSCOPIC Hysterectomy,;  Surgeon: Delice Lesch, MD;  Location: [facility];  Service: Gynecology;  Laterality: N/A;  . Abdominal hysterectomy Bilateral 01/06/2014    Procedure: HYSTERECTOMY ABDOMINAL WITH BILATERAL SALPINGECTOMY ;  Surgeon: Delice Lesch, MD;  Location: [facility];  Service: Gynecology;  Laterality: Bilateral;  . Transverse colon resection N/A 01/06/2014    Procedure: OVERSEW OF SEROSA TEAR OF THE TRANSVERSE COLON ;  Surgeon: Alphonsa Overall, MD;  Location: [facility];  Service: General;  Laterality: N/A;   Family History  Problem Relation Age of Onset  . Heart disease Mother 2    heart attack  . Depression Mother   . Hypertension Father 19   History  Substance Use Topics  . Smoking status: Never Smoker    . Smokeless tobacco: Never Used  . Alcohol Use: No   OB History    Gravida Para Term Preterm AB TAB SAB Ectopic Multiple Living   4    1  1   4      Review of Systems  Constitutional: Negative for fever and chills.  HENT: Negative.   Respiratory: Negative.   Cardiovascular: Positive for chest pain.  Gastrointestinal: Negative.   Musculoskeletal:       Bilateral lower extremity discomfort.  Skin: Negative.  Negative for color change and rash.  Neurological: Negative.       Allergies  Mango flavor and Penicillins  Home Medications   Prior to Admission medications   Medication Sig Start Date End Date Taking? Authorizing Provider  buPROPion (WELLBUTRIN XL) 150 MG 24 hr tablet Take 1 tablet (150 mg total) by mouth every morning. 12/26/13 12/26/14 Yes Merian Capron, MD  ciprofloxacin (CIPRO) 500 MG tablet Take 500 mg by mouth 2 (two) times daily.   Yes Historical Provider, MD  clobetasol ointment (TEMOVATE) 5.42 % Apply 1 application topically 2 (two) times daily. To scalp   Yes Historical Provider, MD  clonazePAM (KLONOPIN) 0.5 MG tablet Take 1 tablet (0.5 mg total) by mouth daily as needed for anxiety. 12/26/13  Yes Merian Capron, MD  escitalopram (LEXAPRO) 10 MG tablet Take 0.5 tablets (5 mg total) by mouth daily. 12/26/13  Yes Merian Capron, MD  ferrous sulfate 325 (  65 FE) MG tablet Take 325 mg by mouth 2 (two) times daily with a meal.   Yes Historical Provider, MD  ibuprofen (ADVIL,MOTRIN) 800 MG tablet Take 1 tablet (800 mg total) by mouth every 8 (eight) hours as needed. 01/28/14  Yes Gavin Pound, CNM  Multiple Vitamin (MULTIVITAMIN) capsule Take 1 capsule by mouth daily.   Yes Historical Provider, MD  fluconazole (DIFLUCAN) 150 MG tablet Take 1 tablet (150 mg total) by mouth daily. Now, then repeat in 48 hours if symptoms persist. Patient not taking: Reported on 02/13/2014 01/28/14   Gavin Pound, CNM  oxyCODONE-acetaminophen (PERCOCET/ROXICET) 5-325 MG per tablet Take 1-2 tablets  by mouth every 4 (four) hours as needed for severe pain (moderate to severe pain (when tolerating fluids)). Patient not taking: Reported on 02/13/2014 01/08/14   Earnstine Regal, PA-C  oxyCODONE-acetaminophen (ROXICET) 5-325 MG per tablet Take 1-2 tablets by mouth every 6 (six) hours as needed. Patient not taking: Reported on 02/13/2014 01/28/14   Gavin Pound, CNM   BP 116/78 mmHg  Pulse 65  Temp(Src) 97.9 F (36.6 C) (Oral)  Resp 18  SpO2 100%  LMP 10/24/2013 Physical Exam  Constitutional: She is oriented to person, place, and time. She appears well-developed and well-nourished.  HENT:  Head: Normocephalic.  Neck: Normal range of motion. Neck supple.  Cardiovascular: Normal rate, regular rhythm and intact distal pulses.   Pulmonary/Chest: Effort normal and breath sounds normal. She has no wheezes. She has no rales. She exhibits no tenderness.  Abdominal: Soft. Bowel sounds are normal. There is no tenderness. There is no rebound and no guarding.  Musculoskeletal: Normal range of motion. She exhibits no edema.  Bilateral calf tenderness. No swelling of lower extremities, no erythema.   Neurological: She is alert and oriented to person, place, and time.  Skin: Skin is warm and dry. No rash noted.  Psychiatric: She has a normal mood and affect.    ED Course  Procedures (including critical care time) Labs Review Labs Reviewed - No data to display  Imaging Review No results found.   EKG Interpretation None      MDM   Final diagnoses:  None    1. Bilateral LE pain 2. Nonspecific chest pain  Chart reviewed. The patient had a negative doppler study following the surgery after complaint of right lower extremity pain. She has no LE edema, or discoloration. There is no hypoxia, tachycardia and she is PERC negative with procedure greater than 30 days ago. These considerations were explained to the patient who states she is still concerned. She has follow up in place tomorrow with her  surgeon and will request further evaluation from him/her at that time.     Dewaine Oats, PA-C 02/13/14 [place], MD 02/14/14 [id number]  Arbie Cookey, MD 02/14/14 [phone]

## 2014-02-13 NOTE — ED Notes (Signed)
Pt states she had an abdominal hysterectomy on Dec 15th  Pt states yesterday she started having chest pain when she breathes in and out and she has been having pain in the back of her legs like cramping  Pt states she has felt short of breath today as well

## 2014-02-13 NOTE — ED Notes (Signed)
Pt stated that she does not want to wait for discharge papers. Nehemiah Settle discussed with pt the reasons to return to the ED if symptoms worsen or change. Pt has follow up appointment with surgery team today. Pt aware to follow up on Monday if she is unable to keep appointment today. PT verbalized understanding. Pt ambulated to exit with significant other without difficulty.

## 2014-02-13 NOTE — ED Notes (Signed)
PA at bedside at this time.  

## 2014-02-13 NOTE — ED Notes (Signed)
Nehemiah Settle, PA is speaking with pt again at this time

## 2014-02-18 ENCOUNTER — Telehealth: Payer: Self-pay | Admitting: Family Medicine

## 2014-02-18 NOTE — Telephone Encounter (Signed)
FYI new pt coming in tomorrow.  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  She states she has lots of anxiety due to her job and has been seen by Dr. Freddy Jaksch at [facility].  She is on Welbutrin 150 mg, Lexapro 5 mg and Clonopin PRN and she states she has been using a lot of Clonopin lately due to the amount of stress she has been under.  She moved here from [place] 11 months ago?  I set her up for 45 min appt tomorrow.

## 2014-02-19 ENCOUNTER — Encounter: Payer: Self-pay | Admitting: Medical

## 2014-02-19 ENCOUNTER — Telehealth: Payer: Self-pay | Admitting: Medical

## 2014-02-19 ENCOUNTER — Ambulatory Visit (INDEPENDENT_AMBULATORY_CARE_PROVIDER_SITE_OTHER): Payer: Federal, State, Local not specified - PPO | Admitting: Medical

## 2014-02-19 VITALS — BP 120/80 | HR 80 | Temp 98.1°F | Resp 15 | Ht 67.0 in | Wt 233.0 lb

## 2014-02-19 DIAGNOSIS — F411 Generalized anxiety disorder: Secondary | ICD-10-CM

## 2014-02-19 MED ORDER — ESCITALOPRAM OXALATE 20 MG PO TABS: 20.0000 mg | ORAL_TABLET | Freq: Every day | ORAL | Status: DC

## 2014-02-19 MED ORDER — CLONAZEPAM 0.5 MG PO TABS: 0.5000 mg | ORAL_TABLET | Freq: Every day | ORAL | Status: DC | PRN

## 2014-02-19 NOTE — Progress Notes (Signed)
Subjective: Here as a new patient today.   Just moved to [place] in recent months.  Here today for generalized anxiety disorder.  01/06/14 had hysterectomy at [facility], been out of work 6 weeks. Her employer is upset that she hasn't been back to work.  They took away her duties, and as a result her anxiety has really kicked up.   This incident worsened her anxiety.   "Whole body is in a tense mode."  Lately taking Klonopin daily as opposed to prior using prn.  Since employer took away her duties this past week, been using 1 klonopin daily except yesterday took 2.  Scheduled to go back to work soon back in [place] and really dreading the return.    First diagnosed with anxiety in 2010 when she started working for [employer].   Thought she was having heart attack, worried since her mother had MI.  After overnight hospital visit and MI ruled out, started using medications for anxiety.  Currently taking Wellbutrin 150mg  XL and Lexapro 10mg  since 2010.   But does have been adjusted up and down over time.  Has seen [facility] in [place], but was worried about going there given her security clearance, didn't want psychiatry visits to look bad on her employment records.  Not currently seeing a counselor.  Made appt with but after hysterectomy, has delayed this.    current symptoms include panic attacks including chest pain, SOB, tight in chest, feels fatigue.   Getting panic attacks often, daily, worse since dealing with employer over back to work.   Gynecology hasn't released her until 03/02/14.    Currently feels somewhat down in mind.   Feeling much better with activities or things she enjoys now that the hysterectomy is behind her.   Not sleeping well currently.   Denies HI/SI.    ROS as in subjective  Objective: BP 120/80 mmHg  Pulse 80  Temp(Src) 98.1 F (36.7 C) (Oral)  Resp 15  Ht 5\' 7"  (1.702 m)  Wt 233 lb (105.688 kg)  BMI 36.48 kg/m2  LMP  10/24/2013  Gen: wd, wn, nad, AA female Psych: pleasant, stutters when seems anxious, otherwise answers questions appropriately Heart: RRR, normal S1, S2, no murmurs Lungs clear Ext: no edema Pulses normal   Assessment: Encounter Diagnosis  Name Primary?  . Generalized anxiety disorder Yes     Plan: PQH 9 score of 11.  General Anxiety GAD-7 score of 21!    We discussed her concerns.  Discussed ways to handle anxiety such as stress reduction, exercise, Yoga, calming music, spending times with friends.  C/t Wellbutrin 150mg  XL daily, increased to Lexapro 20mg  daily, c/t Klonopin prn, referral to counseling.  F/u 3-4 wk, sooner prn.

## 2014-02-19 NOTE — Telephone Encounter (Signed)
Refer to counseling, start with [facility] [phone] office [email or web address] [address] [place], [place], [place] [address]  Rema Fendt, therapist  Toy Cookey, MA, clinical psychologist  Cognitive-Behavior Therapy; Mood Disorders; Anxiety Disorders; adult and child ADHD; Family Therapy; Stress Management; personal growth, and Marital Therapy.    Terrance Mass Ph.D., clinical psychologist Cognitive-Behavior Therapy; Mood Disorders; Anxiety Disorders; Stress     Management   [facility] [address] [place], [place] [address] [phone]   [facility], psychotherapist [address] [place], [place] [address] [phone]   Karin Golden Ph.D., clinical psychologist [phone] office [place],  [id number] Cognitive Behavior Therapy, Depression, Bipolar, Anxiety, Grief and Loss

## 2014-02-20 ENCOUNTER — Inpatient Hospital Stay (HOSPITAL_COMMUNITY)
Admission: AD | Admit: 2014-02-20 | Discharge: 2014-02-20 | Disposition: A | Discharge status: Home / Self Care | Payer: Federal, State, Local not specified - PPO | Source: Ambulatory Visit | Attending: Obstetrics and Gynecology | Admitting: Obstetrics and Gynecology

## 2014-02-20 ENCOUNTER — Encounter (HOSPITAL_COMMUNITY): Payer: Self-pay | Admitting: *Deleted

## 2014-02-20 DIAGNOSIS — F419 Anxiety disorder, unspecified: Secondary | ICD-10-CM | POA: Diagnosis not present

## 2014-02-20 DIAGNOSIS — R32 Unspecified urinary incontinence: Secondary | ICD-10-CM | POA: Diagnosis present

## 2014-02-20 DIAGNOSIS — Z90710 Acquired absence of both cervix and uterus: Secondary | ICD-10-CM | POA: Insufficient documentation

## 2014-02-20 DIAGNOSIS — R109 Unspecified abdominal pain: Secondary | ICD-10-CM | POA: Diagnosis not present

## 2014-02-20 LAB — URINALYSIS, ROUTINE W REFLEX MICROSCOPIC
Bilirubin Urine: NEGATIVE
Glucose, UA: NEGATIVE mg/dL
HGB URINE DIPSTICK: NEGATIVE
Ketones, ur: NEGATIVE mg/dL
LEUKOCYTES UA: NEGATIVE
NITRITE: NEGATIVE
PROTEIN: NEGATIVE mg/dL
Specific Gravity, Urine: 1.01 (ref 1.005–1.030)
UROBILINOGEN UA: 0.2 mg/dL (ref 0.0–1.0)
pH: 6.5 (ref 5.0–8.0)

## 2014-02-20 LAB — COMPREHENSIVE METABOLIC PANEL
ALT: 32 U/L (ref 0–35)
ANION GAP: 4 — AB (ref 5–15)
AST: 22 U/L (ref 0–37)
Albumin: 3.9 g/dL (ref 3.5–5.2)
Alkaline Phosphatase: 78 U/L (ref 39–117)
BILIRUBIN TOTAL: 0.5 mg/dL (ref 0.3–1.2)
BUN: 9 mg/dL (ref 6–23)
CALCIUM: 9.1 mg/dL (ref 8.4–10.5)
CO2: 29 mmol/L (ref 19–32)
CREATININE: 0.91 mg/dL (ref 0.50–1.10)
Chloride: 105 mmol/L (ref 96–112)
GFR calc non Af Amer: 78 mL/min — ABNORMAL LOW (ref >90)
Glucose, Bld: 100 mg/dL — ABNORMAL HIGH (ref 70–99)
Potassium: 4.5 mmol/L (ref 3.5–5.1)
Sodium: 138 mmol/L (ref 135–145)
Total Protein: 7.6 g/dL (ref 6.0–8.3)

## 2014-02-20 LAB — CBC WITH DIFFERENTIAL/PLATELET
BASOS ABS: 0 10*3/uL (ref 0.0–0.1)
Basophils Relative: 1 % (ref 0–1)
EOS ABS: 0.2 10*3/uL (ref 0.0–0.7)
EOS PCT: 2 % (ref 0–5)
HCT: 33.4 % — ABNORMAL LOW (ref 36.0–46.0)
HEMOGLOBIN: 10.7 g/dL — AB (ref 12.0–15.0)
LYMPHS ABS: 1.8 10*3/uL (ref 0.7–4.0)
LYMPHS PCT: 21 % (ref 12–46)
MCH: 24.7 pg — AB (ref 26.0–34.0)
MCHC: 32 g/dL (ref 30.0–36.0)
MCV: 77 fL — ABNORMAL LOW (ref 78.0–100.0)
Monocytes Absolute: 0.8 10*3/uL (ref 0.1–1.0)
Monocytes Relative: 9 % (ref 3–12)
NEUTROS ABS: 6 10*3/uL (ref 1.7–7.7)
NEUTROS PCT: 67 % (ref 43–77)
PLATELETS: 368 10*3/uL (ref 150–400)
RBC: 4.34 MIL/uL (ref 3.87–5.11)
RDW: 16.6 % — ABNORMAL HIGH (ref 11.5–15.5)
WBC: 8.8 10*3/uL (ref 4.0–10.5)

## 2014-02-20 NOTE — Discharge Instructions (Signed)
Urinary Incontinence Urinary incontinence is the involuntary loss of urine from your bladder. CAUSES  There are many causes of urinary incontinence. They include:  Medicines.  Infections.  Prostatic enlargement, leading to overflow of urine from your bladder.  Surgery.  Neurological diseases.  Emotional factors. SIGNS AND SYMPTOMS Urinary Incontinence can be divided into four types: 1. Urge incontinence. Urge incontinence is the involuntary loss of urine before you have the opportunity to go to the bathroom. There is a sudden urge to void but not enough time to reach a bathroom. 2. Stress incontinence. Stress incontinence is the sudden loss of urine with any activity that forces urine to pass. It is commonly caused by anatomical changes to the pelvis and sphincter areas of your body. 3. Overflow incontinence. Overflow incontinence is the loss of urine from an obstructed opening to your bladder. This results in a backup of urine and a resultant buildup of pressure within the bladder. When the pressure within the bladder exceeds the closing pressure of the sphincter, the urine overflows, which causes incontinence, similar to water overflowing a dam. 4. Total incontinence. Total incontinence is the loss of urine as a result of the inability to store urine within your bladder. DIAGNOSIS  Evaluating the cause of incontinence may require:  A thorough and complete medical and obstetric history.  A complete physical exam.  Laboratory tests such as a urine culture and sensitivities. When additional tests are indicated, they can include:  An ultrasound exam.  Kidney and bladder X-rays.  Cystoscopy. This is an exam of the bladder using a narrow scope.  Urodynamic testing to test the nerve function to the bladder and sphincter areas. TREATMENT  Treatment for urinary incontinence depends on the cause:  For urge incontinence caused by a bacterial infection, antibiotics will be prescribed.  If the urge incontinence is related to medicines you take, your health care provider may have you change the medicine.  For stress incontinence, surgery to re-establish anatomical support to the bladder or sphincter, or both, will often correct the condition.  For overflow incontinence caused by an enlarged prostate, an operation to open the channel through the enlarged prostate will allow the flow of urine out of the bladder. In women with fibroids, a hysterectomy may be recommended.  For total incontinence, surgery on your urinary sphincter may help. An artificial urinary sphincter (an inflatable cuff placed around the urethra) may be required. In women who have developed a hole-like passage between their bladder and vagina (vesicovaginal fistula), surgery to close the fistula often is required. HOME CARE INSTRUCTIONS  Normal daily hygiene and the use of pads or adult diapers that are changed regularly will help prevent odors and skin damage.  Avoid caffeine. It can overstimulate your bladder.  Use the bathroom regularly. Try about every 2-3 hours to go to the bathroom, even if you do not feel the need to do so. Take time to empty your bladder completely. After urinating, wait a minute. Then try to urinate again.  For causes involving nerve dysfunction, keep a log of the medicines you take and a journal of the times you go to the bathroom. SEEK MEDICAL CARE IF:  You experience worsening of pain instead of improvement in pain after your procedure.  Your incontinence becomes worse instead of better. SEE IMMEDIATE MEDICAL CARE IF:  You experience fever or shaking chills.  You are unable to pass your urine.  You have redness spreading into your groin or down into your thighs. MAKE SURE   YOU:   Understand these instructions.   Will watch your condition.  Will get help right away if you are not doing well or get worse. Document Released: 02/17/2004 Document Revised: 10/30/2012 Document  Reviewed: 06/18/2012 ExitCare Patient Information 2015 [employer]. This information is not intended to replace advice given to you by your health care provider. Make sure you discuss any questions you have with your health care provider.  

## 2014-02-20 NOTE — MAU Provider Note (Signed)
History    Tiffany [name] is a 38y.o. who presents for urinary leakage since last week, given Cipro for UTI and has completed medication.  Reports currently wearing depends for incontinence.  States that she has not been feeling like she has been emptying bladder completely and states it feels "heavy and tight."  Patient reports urgency, but denies other symptoms.  Reports intermittent right sided pain that started yesterday--dull in nature, but radiates toward back.  Reports swelling in hands and right foot prior that started on Wednesday.  Reports insomnia since surgery. Patient reports that she has not had not eaten today and has not had a meal in ~ 2 days.  States appetite is decreased and has been throughout the week.  States work duties have been decreased, was informed of this on Monday.  Admits that anxiety is not controlled and is awaiting call to schedule appt for mental health counseling.  Appt at [facility] on Monday for urinary issues.   Patient Active Problem List   Diagnosis Date Noted  . Post-operative pain 01/28/2014  . Dysfunctional uterine bleeding 01/06/2014  . Ventral hernia 05/09/2013    No chief complaint on file.  HPI  OB History    Gravida Para Term Preterm AB TAB SAB Ectopic Multiple Living   4    1  1   4       Past Medical History  Diagnosis Date  . Anxiety   . Depression   . Umbilical hernia   . Anemia     Past Surgical History  Procedure Laterality Date  . Cesarean section      2003  . Tubal ligation      2013  . Appendectomy      2002  . Laparoscopic hysterectomy N/A 01/06/2014    Procedure: Attempted  TOTAL LAPAROSCOPIC Hysterectomy,;  Surgeon: Delice Lesch, MD;  Location: [facility];  Service: Gynecology;  Laterality: N/A;  . Abdominal hysterectomy Bilateral 01/06/2014    Procedure: HYSTERECTOMY ABDOMINAL WITH BILATERAL SALPINGECTOMY ;  Surgeon: Delice Lesch, MD;  Location: [facility];  Service: Gynecology;  Laterality: Bilateral;  . Transverse colon  resection N/A 01/06/2014    Procedure: OVERSEW OF SEROSA TEAR OF THE TRANSVERSE COLON ;  Surgeon: Alphonsa Overall, MD;  Location: [facility];  Service: General;  Laterality: N/A;  . Abdominal hysterectomy  2016    Family History  Problem Relation Age of Onset  . Heart disease Mother 77    heart attack  . Depression Mother   . Hypertension Father 21    History  Substance Use Topics  . Smoking status: Never Smoker   . Smokeless tobacco: Never Used  . Alcohol Use: No    Allergies:  Allergies  Allergen Reactions  . Mango Flavor Anaphylaxis and Swelling  . Penicillins Other (See Comments)    unknown    Prescriptions prior to admission  Medication Sig Dispense Refill Last Dose  . buPROPion (WELLBUTRIN XL) 150 MG 24 hr tablet Take 1 tablet (150 mg total) by mouth every morning. 30 tablet 1 02/20/2014 at Unknown time  . clobetasol ointment (TEMOVATE) 1.61 % Apply 1 application topically 2 (two) times daily. To scalp   02/20/2014 at Unknown time  . clonazePAM (KLONOPIN) 0.5 MG tablet Take 1 tablet (0.5 mg total) by mouth daily as needed for anxiety. 30 tablet 0 02/20/2014 at Unknown time  . escitalopram (LEXAPRO) 20 MG tablet Take 1 tablet (20 mg total) by mouth daily. 30 tablet 0 02/20/2014 at Unknown  time  . ferrous sulfate 325 (65 FE) MG tablet Take 325 mg by mouth 2 (two) times daily with a meal.   02/20/2014 at Unknown time  . ibuprofen (ADVIL,MOTRIN) 800 MG tablet Take 1 tablet (800 mg total) by mouth every 8 (eight) hours as needed. (Patient taking differently: Take 800 mg by mouth every 8 (eight) hours as needed for moderate pain. ) 30 tablet 1 Past Month at Unknown time  . Multiple Vitamin (MULTIVITAMIN) capsule Take 1 capsule by mouth daily.   02/20/2014 at Unknown time  . oxyCODONE-acetaminophen (PERCOCET/ROXICET) 5-325 MG per tablet Take 1-2 tablets by mouth every 4 (four) hours as needed for severe pain (moderate to severe pain (when tolerating fluids)). 45 tablet 0 Past Month at Unknown  time  . fluconazole (DIFLUCAN) 150 MG tablet Take 1 tablet (150 mg total) by mouth daily. Now, then repeat in 48 hours if symptoms persist. (Patient not taking: Reported on 02/13/2014) 2 tablet 0 Not Taking  . oxyCODONE-acetaminophen (ROXICET) 5-325 MG per tablet Take 1-2 tablets by mouth every 6 (six) hours as needed. (Patient not taking: Reported on 02/13/2014) 30 tablet 0 Not Taking    Review of Systems  Constitutional: Positive for chills and malaise/fatigue. Negative for fever.  Cardiovascular: Negative for chest pain.  Gastrointestinal: Positive for abdominal pain. Negative for nausea, vomiting, diarrhea and constipation.       BM every other day since surgery, was irregular prior to that.   Genitourinary: Positive for urgency and flank pain. Negative for dysuria, frequency and hematuria.  Neurological: Negative for headaches.  Psychiatric/Behavioral: The patient has insomnia.     See HPI Above Physical Exam   Blood pressure 124/78, pulse 87, temperature 98.4 F (36.9 C), temperature source Oral, resp. rate 18, last menstrual period 10/24/2013, SpO2 100 %.  Results for orders placed or performed during the hospital encounter of 02/20/14 (from the past 24 hour(s))  Urinalysis, Routine w reflex microscopic     Status: None   Collection Time: 02/20/14  6:50 PM  Result Value Ref Range   Color, Urine YELLOW YELLOW   APPearance CLEAR CLEAR   Specific Gravity, Urine 1.010 1.005 - 1.030   pH 6.5 5.0 - 8.0   Glucose, UA NEGATIVE NEGATIVE mg/dL   Hgb urine dipstick NEGATIVE NEGATIVE   Bilirubin Urine NEGATIVE NEGATIVE   Ketones, ur NEGATIVE NEGATIVE mg/dL   Protein, ur NEGATIVE NEGATIVE mg/dL   Urobilinogen, UA 0.2 0.0 - 1.0 mg/dL   Nitrite NEGATIVE NEGATIVE   Leukocytes, UA NEGATIVE NEGATIVE  CBC with Differential/Platelet     Status: Abnormal   Collection Time: 02/20/14  9:45 PM  Result Value Ref Range   WBC 8.8 4.0 - 10.5 K/uL   RBC 4.34 3.87 - 5.11 MIL/uL   Hemoglobin 10.7 (L)  12.0 - 15.0 g/dL   HCT 33.4 (L) 36.0 - 46.0 %   MCV 77.0 (L) 78.0 - 100.0 fL   MCH 24.7 (L) 26.0 - 34.0 pg   MCHC 32.0 30.0 - 36.0 g/dL   RDW 16.6 (H) 11.5 - 15.5 %   Platelets 368 150 - 400 K/uL   Neutrophils Relative % 67 43 - 77 %   Neutro Abs 6.0 1.7 - 7.7 K/uL   Lymphocytes Relative 21 12 - 46 %   Lymphs Abs 1.8 0.7 - 4.0 K/uL   Monocytes Relative 9 3 - 12 %   Monocytes Absolute 0.8 0.1 - 1.0 K/uL   Eosinophils Relative 2 0 - 5 %  Eosinophils Absolute 0.2 0.0 - 0.7 K/uL   Basophils Relative 1 0 - 1 %   Basophils Absolute 0.0 0.0 - 0.1 K/uL  Comprehensive metabolic panel     Status: Abnormal   Collection Time: 02/20/14  9:45 PM  Result Value Ref Range   Sodium 138 135 - 145 mmol/L   Potassium 4.5 3.5 - 5.1 mmol/L   Chloride 105 96 - 112 mmol/L   CO2 29 19 - 32 mmol/L   Glucose, Bld 100 (H) 70 - 99 mg/dL   BUN 9 6 - 23 mg/dL   Creatinine, Ser 0.91 0.50 - 1.10 mg/dL   Calcium 9.1 8.4 - 10.5 mg/dL   Total Protein 7.6 6.0 - 8.3 g/dL   Albumin 3.9 3.5 - 5.2 g/dL   AST 22 0 - 37 U/L   ALT 32 0 - 35 U/L   Alkaline Phosphatase 78 39 - 117 U/L   Total Bilirubin 0.5 0.3 - 1.2 mg/dL   GFR calc non Af Amer 78 (L) >90 mL/min   GFR calc Af Amer >90 >90 mL/min   Anion gap 4 (L) 5 - 15     Physical Exam  Constitutional: She is oriented to person, place, and time. She appears well-developed and well-nourished.  HENT:  Head: Normocephalic and atraumatic.  Eyes: EOM are normal.  Neck: Normal range of motion.  Cardiovascular: Normal rate, regular rhythm and normal heart sounds.   Respiratory: Effort normal and breath sounds normal.  GI: Soft. Bowel sounds are normal. She exhibits distension. There is tenderness in the right lower quadrant and periumbilical area. There is no rigidity, no rebound, no guarding, no CVA tenderness and negative Murphy's sign.  Musculoskeletal: Normal range of motion.  Neurological: She is alert and oriented to person, place, and time.  Skin: Skin is  warm and dry.  Psychiatric: Her mood appears anxious.    ED Course  Assessment: 39 y.o. Female S/P Abdominal Hysterectomy Urinary Leakage Abdominal Pain  Plan: -PE as above -Consult with Dr. Octavio Manns who advised as below -Labs: CBC with Differential, CMP, Urine Culture -Consider CT Scan of Abdomen and Pelvis to R/O Appendicitis   Follow Up (2245) -Labs return normal -Patient instructed to follow up in office as scheduled -Discharged to home in stable condition -Encouraged to call if any questions or concerns arise prior to next scheduled office visit.   Darien Ramus, MSN 02/20/2014 8:28 PM

## 2014-02-20 NOTE — MAU Note (Addendum)
Had abd hysterectomy about 6 wks ago.  Bladder has been leaking, no problems prior to surgery; also feeling pressure in bladder area- / retaining urine. Swelling in hands and feet, esp rt leg below knee- noted today.  Pain in rt side around to back- started today..  Some chest pain- anxiety related . Called office, has appt Monday.

## 2014-02-20 NOTE — MAU Note (Signed)
RN went to room 8 to give patient her AVS and have her sign the esignature form, but patient had already left the hospital.  Gavin Pound, CNM stated she had already talked to her and put in her discharge order.  Unable to obtain esignature or give patient AVS form.  VSS last taken at 2200.

## 2014-02-22 ENCOUNTER — Inpatient Hospital Stay (HOSPITAL_COMMUNITY): Payer: Federal, State, Local not specified - PPO

## 2014-02-22 ENCOUNTER — Inpatient Hospital Stay (HOSPITAL_COMMUNITY)
Admission: AD | Admit: 2014-02-22 | Discharge: 2014-02-22 | Disposition: A | Discharge status: Home / Self Care | Payer: Federal, State, Local not specified - PPO | Source: Ambulatory Visit | Attending: Obstetrics and Gynecology | Admitting: Obstetrics and Gynecology

## 2014-02-22 DIAGNOSIS — R32 Unspecified urinary incontinence: Secondary | ICD-10-CM | POA: Insufficient documentation

## 2014-02-22 DIAGNOSIS — R300 Dysuria: Secondary | ICD-10-CM | POA: Diagnosis not present

## 2014-02-22 DIAGNOSIS — R109 Unspecified abdominal pain: Secondary | ICD-10-CM | POA: Diagnosis not present

## 2014-02-22 DIAGNOSIS — R1032 Left lower quadrant pain: Secondary | ICD-10-CM

## 2014-02-22 DIAGNOSIS — Z90710 Acquired absence of both cervix and uterus: Secondary | ICD-10-CM | POA: Diagnosis not present

## 2014-02-22 LAB — URINE CULTURE: Colony Count: 80000

## 2014-02-22 LAB — CBC WITH DIFFERENTIAL/PLATELET
Basophils Absolute: 0 10*3/uL (ref 0.0–0.1)
Basophils Relative: 1 % (ref 0–1)
EOS ABS: 0.2 10*3/uL (ref 0.0–0.7)
Eosinophils Relative: 2 % (ref 0–5)
HCT: 32.6 % — ABNORMAL LOW (ref 36.0–46.0)
HEMOGLOBIN: 10.5 g/dL — AB (ref 12.0–15.0)
LYMPHS ABS: 2.4 10*3/uL (ref 0.7–4.0)
Lymphocytes Relative: 28 % (ref 12–46)
MCH: 24.6 pg — ABNORMAL LOW (ref 26.0–34.0)
MCHC: 32.2 g/dL (ref 30.0–36.0)
MCV: 76.3 fL — ABNORMAL LOW (ref 78.0–100.0)
MONO ABS: 0.7 10*3/uL (ref 0.1–1.0)
MONOS PCT: 8 % (ref 3–12)
Neutro Abs: 5.3 10*3/uL (ref 1.7–7.7)
Neutrophils Relative %: 61 % (ref 43–77)
Platelets: 356 10*3/uL (ref 150–400)
RBC: 4.27 MIL/uL (ref 3.87–5.11)
RDW: 16.4 % — AB (ref 11.5–15.5)
WBC: 8.6 10*3/uL (ref 4.0–10.5)

## 2014-02-22 LAB — URINALYSIS, ROUTINE W REFLEX MICROSCOPIC
Bilirubin Urine: NEGATIVE
Glucose, UA: NEGATIVE mg/dL
HGB URINE DIPSTICK: NEGATIVE
KETONES UR: NEGATIVE mg/dL
Leukocytes, UA: NEGATIVE
Nitrite: NEGATIVE
Protein, ur: NEGATIVE mg/dL
SPECIFIC GRAVITY, URINE: 1.01 (ref 1.005–1.030)
Urobilinogen, UA: 0.2 mg/dL (ref 0.0–1.0)
pH: 7 (ref 5.0–8.0)

## 2014-02-22 MED ORDER — OXYCODONE-ACETAMINOPHEN 5-325 MG PO TABS
2.0000 | ORAL_TABLET | Freq: Once | ORAL | Status: AC
  Administered 2014-02-22: 2 via ORAL
  Filled 2014-02-22: qty 2

## 2014-02-22 MED ORDER — CLONAZEPAM 0.5 MG PO TABS
0.5000 mg | ORAL_TABLET | Freq: Once | ORAL | Status: AC
  Administered 2014-02-22: 0.5 mg via ORAL
  Filled 2014-02-22: qty 1

## 2014-02-22 MED ORDER — IOHEXOL 300 MG/ML  SOLN
100.0000 mL | Freq: Once | INTRAMUSCULAR | Status: AC | PRN
  Administered 2014-02-22: 100 mL via INTRAVENOUS

## 2014-02-22 MED ORDER — IOHEXOL 300 MG/ML  SOLN: 50.0000 mL | Freq: Once | INTRAMUSCULAR | Status: DC | PRN

## 2014-02-22 MED ORDER — OXYCODONE-ACETAMINOPHEN 5-325 MG PO TABS: 1.0000 | ORAL_TABLET | Freq: Four times a day (QID) | ORAL | Status: DC | PRN

## 2014-02-22 NOTE — MAU Provider Note (Signed)
History    Tiffany [name] is a 39 y.o. Female s/p abdominal hysterectomy presenting, unannounced, for abdominal pain.  Patient seen two days ago for right sided abdominal pain and urinary incontinence.  Patient reports pain now on left side and incontinence remains with pain noted after urination.  Patient states abdominal pain started last night and has worsened despite ibuprofen usage. Patient states that she had mild temperature of 99.5, but no chills.      Patient Active Problem List   Diagnosis Date Noted  . Abdominal pain 02/22/2014  . Right sided abdominal pain 02/20/2014  . Urinary incontinence in female 02/20/2014  . Post-operative pain 01/28/2014  . Dysfunctional uterine bleeding 01/06/2014  . Ventral hernia 05/09/2013    Chief Complaint  Patient presents with  . Abdominal Pain   HPI  OB History    Gravida Para Term Preterm AB TAB SAB Ectopic Multiple Living   4    1  1   4       Past Medical History  Diagnosis Date  . Anxiety   . Depression   . Umbilical hernia   . Anemia     Past Surgical History  Procedure Laterality Date  . Cesarean section      2003  . Tubal ligation      2013  . Appendectomy      2002  . Laparoscopic hysterectomy N/A 01/06/2014    Procedure: Attempted  TOTAL LAPAROSCOPIC Hysterectomy,;  Surgeon: Delice Lesch, MD;  Location: [facility];  Service: Gynecology;  Laterality: N/A;  . Abdominal hysterectomy Bilateral 01/06/2014    Procedure: HYSTERECTOMY ABDOMINAL WITH BILATERAL SALPINGECTOMY ;  Surgeon: Delice Lesch, MD;  Location: [facility];  Service: Gynecology;  Laterality: Bilateral;  . Transverse colon resection N/A 01/06/2014    Procedure: OVERSEW OF SEROSA TEAR OF THE TRANSVERSE COLON ;  Surgeon: Alphonsa Overall, MD;  Location: [facility];  Service: General;  Laterality: N/A;  . Abdominal hysterectomy  2016    Family History  Problem Relation Age of Onset  . Heart disease Mother 66    heart attack  . Depression Mother   . Hypertension  Father 34    History  Substance Use Topics  . Smoking status: Never Smoker   . Smokeless tobacco: Never Used  . Alcohol Use: No    Allergies:  Allergies  Allergen Reactions  . Mango Flavor Anaphylaxis and Swelling  . Penicillins Other (See Comments)    unknown    Prescriptions prior to admission  Medication Sig Dispense Refill Last Dose  . buPROPion (WELLBUTRIN XL) 150 MG 24 hr tablet Take 1 tablet (150 mg total) by mouth every morning. 30 tablet 1 02/20/2014 at Unknown time  . clobetasol ointment (TEMOVATE) 5.40 % Apply 1 application topically 2 (two) times daily. To scalp   02/20/2014 at Unknown time  . clonazePAM (KLONOPIN) 0.5 MG tablet Take 1 tablet (0.5 mg total) by mouth daily as needed for anxiety. 30 tablet 0 02/20/2014 at Unknown time  . escitalopram (LEXAPRO) 20 MG tablet Take 1 tablet (20 mg total) by mouth daily. 30 tablet 0 02/20/2014 at Unknown time  . ferrous sulfate 325 (65 FE) MG tablet Take 325 mg by mouth 2 (two) times daily with a meal.   02/20/2014 at Unknown time  . ibuprofen (ADVIL,MOTRIN) 800 MG tablet Take 1 tablet (800 mg total) by mouth every 8 (eight) hours as needed. (Patient taking differently: Take 800 mg by mouth every 8 (eight) hours as needed  for moderate pain. ) 30 tablet 1 Past Month at Unknown time  . Multiple Vitamin (MULTIVITAMIN) capsule Take 1 capsule by mouth daily.   02/20/2014 at Unknown time  . oxyCODONE-acetaminophen (PERCOCET/ROXICET) 5-325 MG per tablet Take 1-2 tablets by mouth every 4 (four) hours as needed for severe pain (moderate to severe pain (when tolerating fluids)). 45 tablet 0 Past Month at Unknown time    Review of Systems  Genitourinary: Positive for dysuria and urgency.    See HPI Above Physical Exam   Blood pressure 136/86, pulse 79, temperature 98.8 F (37.1 C), temperature source Oral, resp. rate 18, last menstrual period 10/24/2013.  Results for orders placed or performed during the hospital encounter of 02/22/14 (from  the past 24 hour(s))  Urinalysis, Routine w reflex microscopic     Status: None   Collection Time: 02/22/14  2:20 PM  Result Value Ref Range   Color, Urine YELLOW YELLOW   APPearance CLEAR CLEAR   Specific Gravity, Urine 1.010 1.005 - 1.030   pH 7.0 5.0 - 8.0   Glucose, UA NEGATIVE NEGATIVE mg/dL   Hgb urine dipstick NEGATIVE NEGATIVE   Bilirubin Urine NEGATIVE NEGATIVE   Ketones, ur NEGATIVE NEGATIVE mg/dL   Protein, ur NEGATIVE NEGATIVE mg/dL   Urobilinogen, UA 0.2 0.0 - 1.0 mg/dL   Nitrite NEGATIVE NEGATIVE   Leukocytes, UA NEGATIVE NEGATIVE  CBC with Differential/Platelet     Status: Abnormal   Collection Time: 02/22/14  6:15 PM  Result Value Ref Range   WBC 8.6 4.0 - 10.5 K/uL   RBC 4.27 3.87 - 5.11 MIL/uL   Hemoglobin 10.5 (L) 12.0 - 15.0 g/dL   HCT 32.6 (L) 36.0 - 46.0 %   MCV 76.3 (L) 78.0 - 100.0 fL   MCH 24.6 (L) 26.0 - 34.0 pg   MCHC 32.2 30.0 - 36.0 g/dL   RDW 16.4 (H) 11.5 - 15.5 %   Platelets 356 150 - 400 K/uL   Neutrophils Relative % 61 43 - 77 %   Neutro Abs 5.3 1.7 - 7.7 K/uL   Lymphocytes Relative 28 12 - 46 %   Lymphs Abs 2.4 0.7 - 4.0 K/uL   Monocytes Relative 8 3 - 12 %   Monocytes Absolute 0.7 0.1 - 1.0 K/uL   Eosinophils Relative 2 0 - 5 %   Eosinophils Absolute 0.2 0.0 - 0.7 K/uL   Basophils Relative 1 0 - 1 %   Basophils Absolute 0.0 0.0 - 0.1 K/uL     Physical Exam  Constitutional: She is oriented to person, place, and time. She appears well-developed and well-nourished.  HENT:  Head: Normocephalic and atraumatic.  Eyes: EOM are normal.  Neck: Normal range of motion.  Cardiovascular: Normal rate, regular rhythm and normal heart sounds.   Respiratory: Effort normal and breath sounds normal.  GI: Soft. Bowel sounds are normal. She exhibits no distension and no mass. There is tenderness in the right lower quadrant and left lower quadrant. There is guarding. There is no rigidity, no rebound and negative Murphy's sign.  Incision healing well.    Musculoskeletal: Normal range of motion.  Neurological: She is alert and oriented to person, place, and time.  Skin: Skin is warm and dry.  Psychiatric: Her mood appears anxious.    CT Scan of Abdomen and Pelvis with Contrast Impression:  Suspect hemorrhagic cyst left (4.2 x 2.3cm) ovary with fluid tracking from the left ovary inferiorly. Suspect recent ovarian cyst rupture. A mild degree of mesenteric thickening  in the pelvis, primarily on the left, may be of postoperative etiology but also could be due to inflammation from fluid leaking from the left ovary. There is no abscess in the postoperative region.  Note that there is postoperative change in the midline upper pelvic wall with mild stranding extending to the level of the superior urinary bladder. This area is consistent with the postoperative abdominal hysterectomy. No abscess or mass in this area.  Elsewhere no abscess or bowel obstruction. Appendix absent. Liver prominent without focal lesion. No renal or ureteral calculus. No hydronephrosis.  ED Course  Assessment: 39 y.o. Female S/P Hysterectomy Abdominal Pain Urinary Incontinence & Dysuria  Plan: -PE as above -PO percocet for pain -UA sent -Abdominal and Pelvic CT Scan  Follow Up (2423) -CT scan reveal Left hemorrhagic cysts -CBC with diff ordered-WBC wnl, HgB & HCT stable -Dr. Scotty Court consulted and advised as below -Give rx for percocet to maintain pain: RX Percocet 5/325, Take 1-2 Q6hrs prn, Disp 45, RF 0 -Keep appt as scheduled for tomorrow 2/1 -Make appt in 6-8 wks for f/u US for ovarian cysts -POC reviewed with patient who verbalized understanding -Questions and concerns addressed -Encouraged to call if any questions or concerns arise prior to next scheduled office visit.  -Discharged to home in stable condition  Maddux First LYNN CNM, MSN 02/22/2014 3:01 PM

## 2014-02-22 NOTE — MAU Note (Signed)
Pt states had hysterectomy 5-6 weeks ago. Here today with severe lower abd pain across lower abdomen

## 2014-02-22 NOTE — Progress Notes (Signed)
JEmly, CNM notified of need for change in CT. Anxiety med to be ordered as well.

## 2014-02-22 NOTE — Discharge Instructions (Signed)
Ovarian Cyst An ovarian cyst is a fluid-filled sac that forms on an ovary. The ovaries are small organs that produce eggs in women. Various types of cysts can form on the ovaries. Most are not cancerous. Many do not cause problems, and they often go away on their own. Some may cause symptoms and require treatment. Common types of ovarian cysts include:  Functional cysts--These cysts may occur every month during the menstrual cycle. This is normal. The cysts usually go away with the next menstrual cycle if the woman does not get pregnant. Usually, there are no symptoms with a functional cyst.  Endometrioma cysts--These cysts form from the tissue that lines the uterus. They are also called "chocolate cysts" because they become filled with blood that turns brown. This type of cyst can cause pain in the lower abdomen during intercourse and with your menstrual period.  Cystadenoma cysts--This type develops from the cells on the outside of the ovary. These cysts can get very big and cause lower abdomen pain and pain with intercourse. This type of cyst can twist on itself, cut off its blood supply, and cause severe pain. It can also easily rupture and cause a lot of pain.  Dermoid cysts--This type of cyst is sometimes found in both ovaries. These cysts may contain different kinds of body tissue, such as skin, teeth, hair, or cartilage. They usually do not cause symptoms unless they get very big.  Theca lutein cysts--These cysts occur when too much of a certain hormone (human chorionic gonadotropin) is produced and overstimulates the ovaries to produce an egg. This is most common after procedures used to assist with the conception of a baby (in vitro fertilization). CAUSES   Fertility drugs can cause a condition in which multiple large cysts are formed on the ovaries. This is called ovarian hyperstimulation syndrome.  A condition called polycystic ovary syndrome can cause hormonal imbalances that can lead to  nonfunctional ovarian cysts. SIGNS AND SYMPTOMS  Many ovarian cysts do not cause symptoms. If symptoms are present, they may include:  Pelvic pain or pressure.  Pain in the lower abdomen.  Pain during sexual intercourse.  Increasing girth (swelling) of the abdomen.  Abnormal menstrual periods.  Increasing pain with menstrual periods.  Stopping having menstrual periods without being pregnant. DIAGNOSIS  These cysts are commonly found during a routine or annual pelvic exam. Tests may be ordered to find out more about the cyst. These tests may include:  Ultrasound.  X-ray of the pelvis.  CT scan.  MRI.  Blood tests. TREATMENT  Many ovarian cysts go away on their own without treatment. Your health care provider may want to check your cyst regularly for 2-3 months to see if it changes. For women in menopause, it is particularly important to monitor a cyst closely because of the higher rate of ovarian cancer in menopausal women. When treatment is needed, it may include any of the following:  A procedure to drain the cyst (aspiration). This may be done using a long needle and ultrasound. It can also be done through a laparoscopic procedure. This involves using a thin, lighted tube with a tiny camera on the end (laparoscope) inserted through a small incision.  Surgery to remove the whole cyst. This may be done using laparoscopic surgery or an open surgery involving a larger incision in the lower abdomen.  Hormone treatment or birth control pills. These methods are sometimes used to help dissolve a cyst. HOME CARE INSTRUCTIONS   Only take over-the-counter   or prescription medicines as directed by your health care provider.  Follow up with your health care provider as directed.  Get regular pelvic exams and Pap tests. SEEK MEDICAL CARE IF:   Your periods are late, irregular, or painful, or they stop.  Your pelvic pain or abdominal pain does not go away.  Your abdomen becomes  larger or swollen.  You have pressure on your bladder or trouble emptying your bladder completely.  You have pain during sexual intercourse.  You have feelings of fullness, pressure, or discomfort in your stomach.  You lose weight for no apparent reason.  You feel generally ill.  You become constipated.  You lose your appetite.  You develop acne.  You have an increase in body and facial hair.  You are gaining weight, without changing your exercise and eating habits.  You think you are pregnant. SEEK IMMEDIATE MEDICAL CARE IF:   You have increasing abdominal pain.  You feel sick to your stomach (nauseous), and you throw up (vomit).  You develop a fever that comes on suddenly.  You have abdominal pain during a bowel movement.  Your menstrual periods become heavier than usual. MAKE SURE YOU:  Understand these instructions.  Will watch your condition.  Will get help right away if you are not doing well or get worse. Document Released: 01/09/2005 Document Revised: 01/14/2013 Document Reviewed: 09/16/2012 [employer] Patient Information 2015 [employer]. This information is not intended to replace advice given to you by your health care provider. Make sure you discuss any questions you have with your health care provider.  

## 2014-02-23 NOTE — Telephone Encounter (Signed)
I called and I spoke with the patient and made her aware that I was going to mail her some information on some counselors that [employer] PA is recommending

## 2014-03-12 ENCOUNTER — Emergency Department (HOSPITAL_COMMUNITY): Payer: Federal, State, Local not specified - PPO

## 2014-03-12 ENCOUNTER — Emergency Department (HOSPITAL_COMMUNITY)
Admission: EM | Admit: 2014-03-12 | Discharge: 2014-03-12 | Disposition: A | Discharge status: Home / Self Care | Payer: Federal, State, Local not specified - PPO | Attending: Emergency Medicine | Admitting: Emergency Medicine

## 2014-03-12 ENCOUNTER — Encounter (HOSPITAL_COMMUNITY): Payer: Self-pay | Admitting: Emergency Medicine

## 2014-03-12 DIAGNOSIS — R079 Chest pain, unspecified: Secondary | ICD-10-CM | POA: Insufficient documentation

## 2014-03-12 DIAGNOSIS — R1030 Lower abdominal pain, unspecified: Secondary | ICD-10-CM | POA: Insufficient documentation

## 2014-03-12 LAB — CBC
HCT: 34.7 % — ABNORMAL LOW (ref 36.0–46.0)
Hemoglobin: 10.9 g/dL — ABNORMAL LOW (ref 12.0–15.0)
MCH: 24.2 pg — ABNORMAL LOW (ref 26.0–34.0)
MCHC: 31.4 g/dL (ref 30.0–36.0)
MCV: 76.9 fL — ABNORMAL LOW (ref 78.0–100.0)
Platelets: 357 10*3/uL (ref 150–400)
RBC: 4.51 MIL/uL (ref 3.87–5.11)
RDW: 16.1 % — AB (ref 11.5–15.5)
WBC: 7.4 10*3/uL (ref 4.0–10.5)

## 2014-03-12 LAB — URINALYSIS, ROUTINE W REFLEX MICROSCOPIC
Bilirubin Urine: NEGATIVE
Glucose, UA: NEGATIVE mg/dL
HGB URINE DIPSTICK: NEGATIVE
Ketones, ur: NEGATIVE mg/dL
LEUKOCYTES UA: NEGATIVE
Nitrite: NEGATIVE
PROTEIN: NEGATIVE mg/dL
Urobilinogen, UA: 0.2 mg/dL (ref 0.0–1.0)
pH: 7 (ref 5.0–8.0)

## 2014-03-12 LAB — COMPREHENSIVE METABOLIC PANEL
ALK PHOS: 71 U/L (ref 39–117)
ALT: 16 U/L (ref 0–35)
ANION GAP: 4 — AB (ref 5–15)
AST: 16 U/L (ref 0–37)
Albumin: 3.5 g/dL (ref 3.5–5.2)
BUN: 7 mg/dL (ref 6–23)
CALCIUM: 8.9 mg/dL (ref 8.4–10.5)
CHLORIDE: 104 mmol/L (ref 96–112)
CO2: 29 mmol/L (ref 19–32)
Creatinine, Ser: 0.89 mg/dL (ref 0.50–1.10)
GFR calc non Af Amer: 81 mL/min — ABNORMAL LOW (ref >90)
Glucose, Bld: 92 mg/dL (ref 70–99)
Potassium: 3.4 mmol/L — ABNORMAL LOW (ref 3.5–5.1)
SODIUM: 137 mmol/L (ref 135–145)
Total Bilirubin: 0.4 mg/dL (ref 0.3–1.2)
Total Protein: 6.8 g/dL (ref 6.0–8.3)

## 2014-03-12 LAB — BASIC METABOLIC PANEL
Anion gap: 5 (ref 5–15)
BUN: 7 mg/dL (ref 6–23)
CHLORIDE: 104 mmol/L (ref 96–112)
CO2: 29 mmol/L (ref 19–32)
Calcium: 8.8 mg/dL (ref 8.4–10.5)
Creatinine, Ser: 0.87 mg/dL (ref 0.50–1.10)
GFR calc Af Amer: >90 mL/min (ref >90)
GFR calc non Af Amer: 83 mL/min — ABNORMAL LOW (ref >90)
Glucose, Bld: 96 mg/dL (ref 70–99)
Potassium: 3.4 mmol/L — ABNORMAL LOW (ref 3.5–5.1)
Sodium: 138 mmol/L (ref 135–145)

## 2014-03-12 LAB — LIPASE, BLOOD: LIPASE: 22 U/L (ref 11–59)

## 2014-03-12 LAB — I-STAT TROPONIN, ED: Troponin i, poc: 0 ng/mL (ref 0.00–0.08)

## 2014-03-12 LAB — D-DIMER, QUANTITATIVE (NOT AT ARMC): D DIMER QUANT: 1.61 ug{FEU}/mL — AB (ref 0.00–0.48)

## 2014-03-12 LAB — BRAIN NATRIURETIC PEPTIDE: B Natriuretic Peptide: 13.9 pg/mL (ref 0.0–100.0)

## 2014-03-12 MED ORDER — HYDROCODONE-ACETAMINOPHEN 5-325 MG PO TABS
1.0000 | ORAL_TABLET | Freq: Once | ORAL | Status: AC
  Administered 2014-03-12: 1 via ORAL
  Filled 2014-03-12: qty 1

## 2014-03-12 MED ORDER — IOHEXOL 350 MG/ML SOLN
80.0000 mL | Freq: Once | INTRAVENOUS | Status: AC | PRN
  Administered 2014-03-12: 80 mL via INTRAVENOUS

## 2014-03-12 MED ORDER — KETOROLAC TROMETHAMINE 30 MG/ML IJ SOLN: 30.0000 mg | Freq: Once | INTRAMUSCULAR | Status: DC

## 2014-03-12 MED ORDER — MORPHINE SULFATE 4 MG/ML IJ SOLN
6.0000 mg | Freq: Once | INTRAMUSCULAR | Status: AC
  Administered 2014-03-12: 6 mg via INTRAVENOUS
  Filled 2014-03-12: qty 2

## 2014-03-12 MED ORDER — IOHEXOL 300 MG/ML  SOLN
25.0000 mL | Freq: Once | INTRAMUSCULAR | Status: AC | PRN
  Administered 2014-03-12: 25 mL via ORAL

## 2014-03-12 MED ORDER — SODIUM CHLORIDE 0.9 % IV BOLUS (SEPSIS)
1000.0000 mL | Freq: Once | INTRAVENOUS | Status: AC
  Administered 2014-03-12: 1000 mL via INTRAVENOUS

## 2014-03-12 NOTE — Discharge Instructions (Signed)
Chest Pain (Nonspecific) Tiffany [name], your CT scan did not show any blood clots or cause for your abdominal pain.  Follow up with your regular doctor within 3 days for continued care.  If symptoms worsen, come back to the ED immediately.  Thank you. It is often hard to give a diagnosis for the cause of chest pain. There is always a chance that your pain could be related to something serious, such as a heart attack or a blood clot in the lungs. You need to follow up with your doctor. HOME CARE  If antibiotic medicine was given, take it as directed by your doctor. Finish the medicine even if you start to feel better.  For the next few days, avoid activities that bring on chest pain. Continue physical activities as told by your doctor.  Do not use any tobacco products. This includes cigarettes, chewing tobacco, and e-cigarettes.  Avoid drinking alcohol.  Only take medicine as told by your doctor.  Follow your doctor's suggestions for more testing if your chest pain does not go away.  Keep all doctor visits you made. GET HELP IF:  Your chest pain does not go away, even after treatment.  You have a rash with blisters on your chest.  You have a fever. GET HELP RIGHT AWAY IF:   You have more pain or pain that spreads to your arm, neck, jaw, back, or belly (abdomen).  You have shortness of breath.  You cough more than usual or cough up blood.  You have very bad back or belly pain.  You feel sick to your stomach (nauseous) or throw up (vomit).  You have very bad weakness.  You pass out (faint).  You have chills. This is an emergency. Do not wait to see if the problems will go away. Call your local emergency services (911 in [place] Do not drive yourself to the hospital. MAKE SURE YOU:   Understand these instructions.  Will watch your condition.  Will get help right away if you are not doing well or get worse. Document Released: 06/28/2007 Document Revised: 01/14/2013 Document  Reviewed: 06/28/2007 [facility] Patient Information 2015 [place], [place]. This information is not intended to replace advice given to you by your health care provider. Make sure you discuss any questions you have with your health care provider.

## 2014-03-12 NOTE — ED Notes (Signed)
CT notified that the pt has finished her contrast.  

## 2014-03-12 NOTE — ED Provider Notes (Signed)
CSN: [id number]     Arrival date & time 03/12/14  0056 History   This chart was scribed for Everlene Balls, MD by Rayfield Citizen, ED Scribe. This patient was seen in room A04C/A04C and the patient's care was started at 3:14 AM.    Chief Complaint  Patient presents with  . Chest Pain   The history is provided by the patient. No language interpreter was used.     HPI Comments: Tiffany [name] is a 39 y.o. female who presents to the Emergency Department complaining of left-sided chest pain with numbness in her left arm beginning this afternoon while at her desk at work. She notes associated SOB and nausea. Nothing makes symptoms better or worse.  She denies vomiting, diaphoresis, recent cough, fever. Patient denies physical exertion or heavy lifting at symptom onset; she does note she was "very stressed". She reports recent travel to and from [place]; last trip today (via train, 14 hour journey). She denies any pain or swelling in her legs, denies any history of blood clots. She reports a maternal cardiac history (heart attack at 45).     She also reports abdominal pain and dysuria. She states that she had a hysterectomy 8 weeks PTA (Jan 06 2014) and her post-surgical pain has worsened acutely today. She denies any blood in urine or stool; she denies any vaginal bleeding or discharge.She was told at the time of surgery that "her intestines and bladder were tangled in scar tissue." Two weeks PTA, [facility] of lower abdominal/pelvic pain and was told she had a ruptured cyst; she was given pain medication at that time. She has previously had UTIs and was treated for one most recently 3 weeks PTA.   Past Medical History  Diagnosis Date  . Anxiety   . Depression   . Umbilical hernia   . Anemia    Past Surgical History  Procedure Laterality Date  . Cesarean section      2003  . Tubal ligation      2013  . Appendectomy      2002  . Laparoscopic hysterectomy N/A  01/06/2014    Procedure: Attempted  TOTAL LAPAROSCOPIC Hysterectomy,;  Surgeon: Delice Lesch, MD;  Location: [facility];  Service: Gynecology;  Laterality: N/A;  . Abdominal hysterectomy Bilateral 01/06/2014    Procedure: HYSTERECTOMY ABDOMINAL WITH BILATERAL SALPINGECTOMY ;  Surgeon: Delice Lesch, MD;  Location: [facility];  Service: Gynecology;  Laterality: Bilateral;  . Transverse colon resection N/A 01/06/2014    Procedure: OVERSEW OF SEROSA TEAR OF THE TRANSVERSE COLON ;  Surgeon: Alphonsa Overall, MD;  Location: [facility];  Service: General;  Laterality: N/A;  . Abdominal hysterectomy  2016   Family History  Problem Relation Age of Onset  . Heart disease Mother 82    heart attack  . Depression Mother   . Hypertension Father 2   History  Substance Use Topics  . Smoking status: Never Smoker   . Smokeless tobacco: Never Used  . Alcohol Use: No   OB History    Gravida Para Term Preterm AB TAB SAB Ectopic Multiple Living   4    1  1   4      Review of Systems  Cardiovascular: Negative for leg swelling.  Gastrointestinal: Negative for blood in stool.  Genitourinary: Positive for dysuria. Negative for hematuria, vaginal bleeding and vaginal discharge.  All other systems reviewed and are negative.  Allergies  Mango flavor and Penicillins  Home Medications   Prior to Admission medications   Medication Sig Start Date End Date Taking? Authorizing Provider  acetaminophen (TYLENOL) 325 MG tablet Take 650 mg by mouth every 6 (six) hours as needed for mild pain.   Yes Historical Provider, MD  buPROPion (WELLBUTRIN XL) 150 MG 24 hr tablet Take 1 tablet (150 mg total) by mouth every morning. 12/26/13 12/26/14 Yes Merian Capron, MD  clonazePAM (KLONOPIN) 0.5 MG tablet Take 1 tablet (0.5 mg total) by mouth daily as needed for anxiety. 02/19/14  Yes Camelia Eng Tysinger, PA-C  clobetasol ointment (TEMOVATE) 9.70 % Apply 1 application topically 2 (two) times daily.     Historical Provider, MD   escitalopram (LEXAPRO) 20 MG tablet Take 1 tablet (20 mg total) by mouth daily. Patient not taking: Reported on 03/12/2014 02/19/14   Camelia Eng Tysinger, PA-C  ferrous sulfate 325 (65 FE) MG tablet Take 325 mg by mouth 2 (two) times daily with a meal.    Historical Provider, MD  ibuprofen (ADVIL,MOTRIN) 800 MG tablet Take 1 tablet (800 mg total) by mouth every 8 (eight) hours as needed. Patient not taking: Reported on 03/12/2014 01/28/14   Gavin Pound, CNM  Multiple Vitamin (MULTIVITAMIN) capsule Take 1 capsule by mouth daily.    Historical Provider, MD  oxyCODONE-acetaminophen (ROXICET) 5-325 MG per tablet Take 1-2 tablets by mouth every 6 (six) hours as needed. Patient not taking: Reported on 03/12/2014 02/22/14   Gavin Pound, CNM   BP 108/80 mmHg  Pulse 71  Temp(Src) 98.4 F (36.9 C) (Oral)  Resp 18  Ht 5\' 7"  (1.702 m)  Wt 238 lb (107.956 kg)  BMI 37.27 kg/m2  SpO2 99%  LMP 10/24/2013 Physical Exam  Constitutional: She is oriented to person, place, and time. She appears well-developed and well-nourished. No distress.  HENT:  Head: Normocephalic and atraumatic.  Nose: Nose normal.  Mouth/Throat: Oropharynx is clear and moist. No oropharyngeal exudate.  Eyes: Conjunctivae and EOM are normal. Pupils are equal, round, and reactive to light. No scleral icterus.  Neck: Normal range of motion. Neck supple. No JVD present. No tracheal deviation present. No thyromegaly present.  Cardiovascular: Normal rate, regular rhythm and normal heart sounds.  Exam reveals no gallop and no friction rub.   No murmur heard. Pulmonary/Chest: Effort normal and breath sounds normal. No respiratory distress. She has no wheezes. She exhibits no tenderness.  Abdominal: Soft. Bowel sounds are normal. She exhibits no distension (TTP of suprapubic and RLQ) and no mass. There is tenderness. There is no rebound and no guarding.  Musculoskeletal: Normal range of motion. She exhibits no edema or tenderness.   Lymphadenopathy:    She has no cervical adenopathy.  Neurological: She is alert and oriented to person, place, and time. No cranial nerve deficit. She exhibits normal muscle tone.  Skin: Skin is warm and dry. No rash noted. No erythema. No pallor.  Nursing note and vitals reviewed.   ED Course  Procedures   DIAGNOSTIC STUDIES: Oxygen Saturation is 99% on RA, normal by my interpretation.    COORDINATION OF CARE: 3:28 AM Discussed treatment plan with pt at bedside and pt agreed to plan.   Labs Review Labs Reviewed  CBC - Abnormal; Notable for the following:    Hemoglobin 10.9 (*)    HCT 34.7 (*)    MCV 76.9 (*)    MCH 24.2 (*)    RDW 16.1 (*)    All other components within normal limits  BASIC METABOLIC PANEL -  Abnormal; Notable for the following:    Potassium 3.4 (*)    GFR calc non Af Amer 83 (*)    All other components within normal limits  URINALYSIS, ROUTINE W REFLEX MICROSCOPIC - Abnormal; Notable for the following:    Specific Gravity, Urine <1.005 (*)    All other components within normal limits  COMPREHENSIVE METABOLIC PANEL - Abnormal; Notable for the following:    Potassium 3.4 (*)    GFR calc non Af Amer 81 (*)    Anion gap 4 (*)    All other components within normal limits  D-DIMER, QUANTITATIVE - Abnormal; Notable for the following:    D-Dimer, Quant 1.61 (*)    All other components within normal limits  BRAIN NATRIURETIC PEPTIDE  LIPASE, BLOOD  I-STAT TROPOININ, ED    Imaging Review Dg Chest 2 View  03/12/2014   CLINICAL DATA:  Left-sided chest and arm pain tonight with dyspnea  EXAM: CHEST  2 VIEW  COMPARISON:  None.  FINDINGS: The heart size and mediastinal contours are within normal limits. Both lungs are clear. The visualized skeletal structures are unremarkable.  IMPRESSION: No active cardiopulmonary disease.   Electronically Signed   By: Andreas Newport M.D.   On: 03/12/2014 01:26     EKG Interpretation   Date/Time:  Thursday March 12 2014 00:59:14 EST Ventricular Rate:  71 PR Interval:  168 QRS Duration: 76 QT Interval:  368 QTC Calculation: 399 R Axis:   73 Text Interpretation:  Normal sinus rhythm Borderline ECG No significant  change since last tracing Confirmed by Glynn Octave [phone]) on  03/12/2014 3:04:05 AM      MDM   Final diagnoses:  Chest pain   Patient presents with multiple complaints going on for months.  Her history is not consistent with ACS.  She does have risk factors for PE and dimer is positive.  CTA is negative for clot.  CT of the abdomen does not show any acute process of SBO.  This may all be related to the patients anxiety she states that she has.  She was given pain medication and IVF in the ED.  UA does not reveal infection.  She will be given primary care follow up, her VS remain within her normal limits and she is safe for DC.  I personally performed the services described in this documentation, which was scribed in my presence. The recorded information has been reviewed and is accurate.     Everlene Balls, MD 03/12/14 1435

## 2014-03-12 NOTE — ED Notes (Signed)
Pt called out reporting chest pain.

## 2014-03-12 NOTE — ED Notes (Signed)
Pt. reports central chest pain radiating to left arm onset this evening with SOB , also reported low abdominal pain today , denies nausea or diaphoresis .

## 2014-03-13 ENCOUNTER — Ambulatory Visit (HOSPITAL_COMMUNITY): Payer: Self-pay | Admitting: Psychiatry

## 2014-03-16 ENCOUNTER — Ambulatory Visit: Payer: Federal, State, Local not specified - PPO | Admitting: Medical

## 2014-03-16 ENCOUNTER — Ambulatory Visit: Payer: Self-pay | Admitting: Family Medicine

## 2014-03-18 ENCOUNTER — Emergency Department (HOSPITAL_COMMUNITY): Payer: Federal, State, Local not specified - PPO

## 2014-03-18 ENCOUNTER — Emergency Department (HOSPITAL_COMMUNITY)
Admission: EM | Admit: 2014-03-18 | Discharge: 2014-03-18 | Disposition: A | Discharge status: Home / Self Care | Payer: Federal, State, Local not specified - PPO | Attending: Emergency Medicine | Admitting: Emergency Medicine

## 2014-03-18 ENCOUNTER — Encounter (HOSPITAL_COMMUNITY): Payer: Self-pay | Admitting: Family Medicine

## 2014-03-18 DIAGNOSIS — F329 Major depressive disorder, single episode, unspecified: Secondary | ICD-10-CM | POA: Insufficient documentation

## 2014-03-18 DIAGNOSIS — F419 Anxiety disorder, unspecified: Secondary | ICD-10-CM | POA: Diagnosis not present

## 2014-03-18 DIAGNOSIS — Z79899 Other long term (current) drug therapy: Secondary | ICD-10-CM | POA: Diagnosis not present

## 2014-03-18 DIAGNOSIS — D649 Anemia, unspecified: Secondary | ICD-10-CM | POA: Diagnosis not present

## 2014-03-18 DIAGNOSIS — Z8719 Personal history of other diseases of the digestive system: Secondary | ICD-10-CM | POA: Insufficient documentation

## 2014-03-18 DIAGNOSIS — R079 Chest pain, unspecified: Secondary | ICD-10-CM | POA: Insufficient documentation

## 2014-03-18 DIAGNOSIS — Z7952 Long term (current) use of systemic steroids: Secondary | ICD-10-CM | POA: Diagnosis not present

## 2014-03-18 DIAGNOSIS — Z880 Allergy status to penicillin: Secondary | ICD-10-CM | POA: Insufficient documentation

## 2014-03-18 LAB — BASIC METABOLIC PANEL
Anion gap: 6 (ref 5–15)
BUN: 8 mg/dL (ref 6–23)
CO2: 30 mmol/L (ref 19–32)
Calcium: 9.2 mg/dL (ref 8.4–10.5)
Chloride: 102 mmol/L (ref 96–112)
Creatinine, Ser: 0.95 mg/dL (ref 0.50–1.10)
GFR calc Af Amer: 86 mL/min — ABNORMAL LOW (ref >90)
GFR, EST NON AFRICAN AMERICAN: 74 mL/min — AB (ref >90)
GLUCOSE: 90 mg/dL (ref 70–99)
POTASSIUM: 3.8 mmol/L (ref 3.5–5.1)
Sodium: 138 mmol/L (ref 135–145)

## 2014-03-18 LAB — CBC
HCT: 34.9 % — ABNORMAL LOW (ref 36.0–46.0)
HEMOGLOBIN: 11.1 g/dL — AB (ref 12.0–15.0)
MCH: 24.3 pg — AB (ref 26.0–34.0)
MCHC: 31.8 g/dL (ref 30.0–36.0)
MCV: 76.4 fL — AB (ref 78.0–100.0)
Platelets: 364 10*3/uL (ref 150–400)
RBC: 4.57 MIL/uL (ref 3.87–5.11)
RDW: 16.2 % — ABNORMAL HIGH (ref 11.5–15.5)
WBC: 6.5 10*3/uL (ref 4.0–10.5)

## 2014-03-18 LAB — I-STAT TROPONIN, ED
TROPONIN I, POC: 0 ng/mL (ref 0.00–0.08)
TROPONIN I, POC: 0 ng/mL (ref 0.00–0.08)

## 2014-03-18 LAB — BRAIN NATRIURETIC PEPTIDE: B NATRIURETIC PEPTIDE 5: 15.2 pg/mL (ref 0.0–100.0)

## 2014-03-18 MED ORDER — ALPRAZOLAM 0.25 MG PO TABS: 0.5000 mg | ORAL_TABLET | Freq: Two times a day (BID) | ORAL | Status: DC | PRN

## 2014-03-18 MED ORDER — ALPRAZOLAM 0.25 MG PO TABS
0.5000 mg | ORAL_TABLET | Freq: Once | ORAL | Status: AC
Start: 2014-03-18 — End: 2014-03-18
  Administered 2014-03-18: 0.5 mg via ORAL
  Filled 2014-03-18: qty 2

## 2014-03-18 NOTE — ED Notes (Signed)
MD at bedside to assess patient.

## 2014-03-18 NOTE — ED Notes (Signed)
Pt reports feeling much better and denies any chest pain at this time.

## 2014-03-18 NOTE — ED Notes (Signed)
Pt reported to this RN that she is feeling extremely depressed and anxious from issues at work and is being seen by her PMD for these symptoms and has an appointment with a therapist on Friday.  Pt denies SI/HI but sts "I just feel really hopeless right now and I'm just extremely depressed."  Pt is tearful upon assessment.  MD made aware.

## 2014-03-18 NOTE — ED Provider Notes (Signed)
CSN: [id number]     Arrival date & time 03/18/14  1131 History   First MD Initiated Contact with Patient 03/18/14 1343     Chief Complaint  Patient presents with  . Chest Pain     (Consider location/radiation/quality/duration/timing/severity/associated sxs/prior Treatment) The history is provided by the patient.  Tiffany [name] is a 39 y.o. female hx of anxiety, depression, here with chest pain. Intermittent chest pain for several months. She came here a week ago and had CT that showed no PE. She also had recent hysterectomy so had a CT abdomen pelvis is unremarkable. She still has persistent chest pain and anxiety. Has been more depressed and stressed out but denies any suicidal ideations. She saw her primary care doctor but didn't get further evaluation. Her mother did have MI age 54s but she has no personal history of CAD.    Past Medical History  Diagnosis Date  . Anxiety   . Depression   . Umbilical hernia   . Anemia    Past Surgical History  Procedure Laterality Date  . Cesarean section      2003  . Tubal ligation      2013  . Appendectomy      2002  . Laparoscopic hysterectomy N/A 01/06/2014    Procedure: Attempted  TOTAL LAPAROSCOPIC Hysterectomy,;  Surgeon: Delice Lesch, MD;  Location: [facility];  Service: Gynecology;  Laterality: N/A;  . Abdominal hysterectomy Bilateral 01/06/2014    Procedure: HYSTERECTOMY ABDOMINAL WITH BILATERAL SALPINGECTOMY ;  Surgeon: Delice Lesch, MD;  Location: [facility];  Service: Gynecology;  Laterality: Bilateral;  . Transverse colon resection N/A 01/06/2014    Procedure: OVERSEW OF SEROSA TEAR OF THE TRANSVERSE COLON ;  Surgeon: Alphonsa Overall, MD;  Location: [facility];  Service: General;  Laterality: N/A;  . Abdominal hysterectomy  2016   Family History  Problem Relation Age of Onset  . Heart disease Mother 4    heart attack  . Depression Mother   . Hypertension Father 57   History  Substance Use Topics  . Smoking status: Never Smoker    . Smokeless tobacco: Never Used  . Alcohol Use: No   OB History    Gravida Para Term Preterm AB TAB SAB Ectopic Multiple Living   4    1  1   4      Review of Systems  Cardiovascular: Positive for chest pain.  All other systems reviewed and are negative.     Allergies  Mango flavor and Penicillins  Home Medications   Prior to Admission medications   Medication Sig Start Date End Date Taking? Authorizing Provider  acetaminophen (TYLENOL) 325 MG tablet Take 650 mg by mouth every 6 (six) hours as needed for mild pain.   Yes Historical Provider, MD  buPROPion (WELLBUTRIN XL) 150 MG 24 hr tablet Take 1 tablet (150 mg total) by mouth every morning. 12/26/13 12/26/14 Yes Merian Capron, MD  clobetasol ointment (TEMOVATE) 4.12 % Apply 1 application topically 2 (two) times daily.    Yes Historical Provider, MD  clonazePAM (KLONOPIN) 0.5 MG tablet Take 1 tablet (0.5 mg total) by mouth daily as needed for anxiety. 02/19/14  Yes Camelia Eng Tysinger, PA-C  ferrous sulfate 325 (65 FE) MG tablet Take 325 mg by mouth 2 (two) times daily with a meal.   Yes Historical Provider, MD  ibuprofen (ADVIL,MOTRIN) 800 MG tablet Take 1 tablet (800 mg total) by mouth every 8 (eight) hours as needed. 01/28/14  Yes Janett Billow  Em[place], CNM  Multiple Vitamin (MULTIVITAMIN) capsule Take 1 capsule by mouth daily.   Yes Historical Provider, MD  sertraline (ZOLOFT) 50 MG tablet Take 50 mg by mouth daily.   Yes Historical Provider, MD  escitalopram (LEXAPRO) 20 MG tablet Take 1 tablet (20 mg total) by mouth daily. Patient not taking: Reported on 03/12/2014 02/19/14   Camelia Eng Tysinger, PA-C   BP 123/81 mmHg  Pulse 64  Temp(Src) 97.8 F (36.6 C) (Oral)  Resp 18  SpO2 100%  LMP 10/24/2013 Physical Exam  Constitutional: She is oriented to person, place, and time.  Anxious, tearful   HENT:  Head: Normocephalic.  Mouth/Throat: Oropharynx is clear and moist.  Eyes: Conjunctivae and EOM are normal. Pupils are equal, round, and  reactive to light.  Neck: Normal range of motion. Neck supple.  Cardiovascular: Normal rate, regular rhythm and normal heart sounds.   Pulmonary/Chest: Effort normal and breath sounds normal. No respiratory distress. She has no wheezes. She has no rales.  Abdominal: Soft. Bowel sounds are normal. She exhibits no distension. There is no tenderness. There is no rebound.  Musculoskeletal: Normal range of motion. She exhibits no edema or tenderness.  Neurological: She is alert and oriented to person, place, and time. No cranial nerve deficit. Coordination normal.  Skin: Skin is dry.  Psychiatric:  Anxious   Nursing note and vitals reviewed.   ED Course  Procedures (including critical care time) Labs Review Labs Reviewed  CBC - Abnormal; Notable for the following:    Hemoglobin 11.1 (*)    HCT 34.9 (*)    MCV 76.4 (*)    MCH 24.3 (*)    RDW 16.2 (*)    All other components within normal limits  BASIC METABOLIC PANEL - Abnormal; Notable for the following:    GFR calc non Af Amer 74 (*)    GFR calc Af Amer 86 (*)    All other components within normal limits  BRAIN NATRIURETIC PEPTIDE  I-STAT TROPOININ, ED  Randolm Idol, ED    Imaging Review Dg Chest 2 View  03/18/2014   CLINICAL DATA:  39 year old female with chest pain shortness of breath and intermittent headaches. Initial encounter.  EXAM: CHEST  2 VIEW  COMPARISON:  Chest CTA 03/12/2014 and earlier.  FINDINGS: Lung volumes remain normal. Normal cardiac size and mediastinal contours. Visualized tracheal air column is within normal limits. Lung parenchyma stable and clear. No pneumothorax or plural effusion. Stable scoliosis. No acute osseous abnormality identified.  IMPRESSION: No acute cardiopulmonary abnormality.   Electronically Signed   By: Genevie Ann M.D.   On: 03/18/2014 12:44     EKG Interpretation   Date/Time:  Wednesday March 18 2014 11:46:41 EST Ventricular Rate:  69 PR Interval:  162 QRS Duration: 72 QT  Interval:  376 QTC Calculation: 402 R Axis:   64 Text Interpretation:  Normal sinus rhythm Low voltage QRS Borderline ECG  No significant change since last tracing Confirmed by Tranell Wojtkiewicz  MD, Choua Ikner  ([id number]) on 03/18/2014 2:04:53 PM      MDM   Final diagnoses:  Chest pain    Tiffany [name] is a 39 y.o. female here with chest pain. Likely anxiety. Labs unremarkable. CXR nl. Delta trop neg. Felt better after xanax. Will refer to cardiology given significant family hx of CAD.     Wandra Arthurs, MD 03/18/14 [phone]

## 2014-03-18 NOTE — ED Notes (Signed)
Pt having chest pain that startd at 8 am this morning at work. sts radiation into her back and arm. sts some SOB.,

## 2014-03-18 NOTE — Discharge Instructions (Signed)
Stay hydrated.   See cardiology if you still have chest pain.   Take xanax for anxiety.   Return to ER if you have severe chest pain, anxiety, thoughts of harming yourself or others.

## 2014-03-18 NOTE — ED Notes (Addendum)
Pt recently seen on 2/18 for chest pain and shortness of breath, discharged after negative chest CT, pt reports the symptoms from that event are similar but she feels worse today. Pt states her symptoms from that day never resolved but they did improve, today pain worsened again. Followed up with her PCP regarding visit and they stated they would continue to monitor her due to strong family history of cardiac problems, but they were relating her symptoms to anxiety currently.

## 2014-04-09 ENCOUNTER — Encounter (HOSPITAL_COMMUNITY): Payer: Self-pay | Admitting: Psychiatry

## 2014-04-09 ENCOUNTER — Ambulatory Visit (INDEPENDENT_AMBULATORY_CARE_PROVIDER_SITE_OTHER): Payer: Federal, State, Local not specified - PPO | Admitting: Psychiatry

## 2014-04-09 VITALS — BP 134/88 | HR 78 | Ht 67.0 in | Wt 233.0 lb

## 2014-04-09 DIAGNOSIS — F411 Generalized anxiety disorder: Secondary | ICD-10-CM

## 2014-04-09 DIAGNOSIS — F331 Major depressive disorder, recurrent, moderate: Secondary | ICD-10-CM

## 2014-04-09 NOTE — Progress Notes (Signed)
Patient ID: Tiffany [name], female   DOB: 1975/03/11, 39 y.o.   MRN: [id number]  [facility] Follow up visit  Oluwatoyin Banales [id number] 39 y.o.  04/09/2014 10:37 AM  Chief Complaint:  Depression  History of Present Illness:   Patient Presents for follow up and medication management for Derpession and anxiety.   Patient is 39 years old currently married African-American female. She is living with her husband and 4 kids. They have recently moved from [place] she works with [employer] for the last 20 years. Initially presented with the following "Says that she has been on different medications starting couple of years ago when she was having stress related to work she started having panic attacks when her manager giving her a hard time. She also had gone to ED and rule out any cardiac problems and heart back since she was having chest pain palpitations as if she is going to choke or die. After that she was diagnosed with generalized anxiety disorder and follows up with her primary care physician. She did change her job despite that she still has had anxiety. Says that Wellbutrin helped her depression but it did not help her anxiety. She's been on different medications in the past including Paxil that made her body warm. Lexapro made her sleepy Zoloft made her cry."  Last visit we added Wellbutrin 150 mg she was supposed to come back in a month but she did not follow up. She had a hysterectomy that got complicated they had to do open surgery. It took 2 months for recovered she was not able to go back to work and when she did she was told that she cannot continue now she is on FMLA. She stressed out because of a job she said that she cannot go back into the stressful environment.  Olin Hauser is gone and in March 25. I told her she should've come back regularly but there was a 3 month since I saw her last visit.  Her husband is here agrees with her anxiety about going back to  work her primary care has increased the Wellbutrin to 300. Discontinued Lexapro added Zoloft 50 mg and increased the Klonopin 0.5 million twice a day. Her therapist is at [facility] she sees her therapist every week her therapist mentioned that she should've come back to the psychiatrist rather than adjusting medications from her primary care provider for which reason she is here today.  She still endorses feeling down, anhedonia, weakness disturbed sleep and energy level. Worry and anxiety related to going back to work and panic apprehension  Severity: 4/10 .  10 being no depression. Context: job stress, worried about her job Modifying factors: supportive husband and family. Less job stress compared to when in [place].  Does not endorse hopelessness suicidal or homicidal thoughts patient does endorse feeling withdrawn and having crying spells.  Does not endorse delusions or hallucinations, there is no psychotic symptoms. There is no current manic symptoms over the past does not endorse any paranoia.        Past Psychiatric History/Hospitalization(s) Denies. Mostly followed with primary care. Have no psych admissions or Suicide attempts.   Hospitalization for psychiatric illness: No History of Electroconvulsive Shock Therapy: No Prior Suicide Attempts: No  Medical History; Past Medical History  Diagnosis Date  . Anxiety   . Depression   . Umbilical hernia   . Anemia     Allergies: Allergies  Allergen Reactions  . Mango Flavor Anaphylaxis and  Swelling  . Penicillins Itching and Swelling    Medications: Outpatient Encounter Prescriptions as of 04/09/2014  Medication Sig  . acetaminophen (TYLENOL) 325 MG tablet Take 650 mg by mouth every 6 (six) hours as needed for mild pain.  Marland Kitchen buPROPion (WELLBUTRIN XL) 150 MG 24 hr tablet Take 1 tablet (150 mg total) by mouth every morning.  . clobetasol ointment (TEMOVATE) 1.70 % Apply 1 application topically 2 (two) times daily.    . clonazePAM (KLONOPIN) 0.5 MG tablet Take 1 tablet (0.5 mg total) by mouth daily as needed for anxiety.  Marland Kitchen escitalopram (LEXAPRO) 20 MG tablet Take 1 tablet (20 mg total) by mouth daily.  . ferrous sulfate 325 (65 FE) MG tablet Take 325 mg by mouth 2 (two) times daily with a meal.  . ibuprofen (ADVIL,MOTRIN) 800 MG tablet Take 1 tablet (800 mg total) by mouth every 8 (eight) hours as needed.  . Multiple Vitamin (MULTIVITAMIN) capsule Take 1 capsule by mouth daily.  . sertraline (ZOLOFT) 50 MG tablet Take 50 mg by mouth daily.  . [DISCONTINUED] ALPRAZolam (XANAX) 0.25 MG tablet Take 2 tablets (0.5 mg total) by mouth 2 (two) times daily as needed for anxiety. (Patient not taking: Reported on 04/09/2014)     Substance Abuse History: Denies. States to be non smoker, no alcohol or using drugs.   Family History; Family History  Problem Relation Age of Onset  . Heart disease Mother 60    heart attack  . Depression Mother   . Hypertension Father 36      Labs:  Recent Results (from the past 2160 hour(s))  Urinalysis, Routine w reflex microscopic     Status: None   Collection Time: 01/28/14  1:16 AM  Result Value Ref Range   Color, Urine YELLOW YELLOW   APPearance CLEAR CLEAR   Specific Gravity, Urine 1.010 1.005 - 1.030   pH 6.0 5.0 - 8.0   Glucose, UA NEGATIVE NEGATIVE mg/dL   Hgb urine dipstick NEGATIVE NEGATIVE   Bilirubin Urine NEGATIVE NEGATIVE   Ketones, ur NEGATIVE NEGATIVE mg/dL   Protein, ur NEGATIVE NEGATIVE mg/dL   Urobilinogen, UA 0.2 0.0 - 1.0 mg/dL   Nitrite NEGATIVE NEGATIVE   Leukocytes, UA NEGATIVE NEGATIVE    Comment: MICROSCOPIC NOT DONE ON URINES WITH NEGATIVE PROTEIN, BLOOD, LEUKOCYTES, NITRITE, OR GLUCOSE <1000 mg/dL.  Wet prep, genital     Status: Abnormal   Collection Time: 01/28/14  2:50 AM  Result Value Ref Range   Yeast Wet Prep HPF POC MODERATE (A) NONE SEEN   Trich, Wet Prep NONE SEEN NONE SEEN   Clue Cells Wet Prep HPF POC NONE SEEN NONE SEEN    WBC, Wet Prep HPF POC MODERATE (A) NONE SEEN    Comment: MODERATE BACTERIA SEEN  Urinalysis, Routine w reflex microscopic     Status: None   Collection Time: 02/20/14  6:50 PM  Result Value Ref Range   Color, Urine YELLOW YELLOW   APPearance CLEAR CLEAR   Specific Gravity, Urine 1.010 1.005 - 1.030   pH 6.5 5.0 - 8.0   Glucose, UA NEGATIVE NEGATIVE mg/dL   Hgb urine dipstick NEGATIVE NEGATIVE   Bilirubin Urine NEGATIVE NEGATIVE   Ketones, ur NEGATIVE NEGATIVE mg/dL   Protein, ur NEGATIVE NEGATIVE mg/dL   Urobilinogen, UA 0.2 0.0 - 1.0 mg/dL   Nitrite NEGATIVE NEGATIVE   Leukocytes, UA NEGATIVE NEGATIVE    Comment: MICROSCOPIC NOT DONE ON URINES WITH NEGATIVE PROTEIN, BLOOD, LEUKOCYTES, NITRITE, OR GLUCOSE <1000 mg/dL.  Urine culture     Status: None   Collection Time: 02/20/14  6:50 PM  Result Value Ref Range   Specimen Description URINE, CLEAN CATCH    Special Requests NONE    Colony Count      80,000 COLONIES/ML Performed at [facility]    Culture      Multiple bacterial morphotypes present, none predominant. Suggest appropriate recollection if clinically indicated. Performed at [facility]    Report Status 02/22/2014 FINAL   CBC with Differential/Platelet     Status: Abnormal   Collection Time: 02/20/14  9:45 PM  Result Value Ref Range   WBC 8.8 4.0 - 10.5 K/uL   RBC 4.34 3.87 - 5.11 MIL/uL   Hemoglobin 10.7 (L) 12.0 - 15.0 g/dL   HCT 33.4 (L) 36.0 - 46.0 %   MCV 77.0 (L) 78.0 - 100.0 fL   MCH 24.7 (L) 26.0 - 34.0 pg   MCHC 32.0 30.0 - 36.0 g/dL   RDW 16.6 (H) 11.5 - 15.5 %   Platelets 368 150 - 400 K/uL   Neutrophils Relative % 67 43 - 77 %   Neutro Abs 6.0 1.7 - 7.7 K/uL   Lymphocytes Relative 21 12 - 46 %   Lymphs Abs 1.8 0.7 - 4.0 K/uL   Monocytes Relative 9 3 - 12 %   Monocytes Absolute 0.8 0.1 - 1.0 K/uL   Eosinophils Relative 2 0 - 5 %   Eosinophils Absolute 0.2 0.0 - 0.7 K/uL   Basophils Relative 1 0 - 1 %   Basophils Absolute 0.0 0.0  - 0.1 K/uL  Comprehensive metabolic panel     Status: Abnormal   Collection Time: 02/20/14  9:45 PM  Result Value Ref Range   Sodium 138 135 - 145 mmol/L   Potassium 4.5 3.5 - 5.1 mmol/L   Chloride 105 96 - 112 mmol/L   CO2 29 19 - 32 mmol/L   Glucose, Bld 100 (H) 70 - 99 mg/dL   BUN 9 6 - 23 mg/dL   Creatinine, Ser 0.91 0.50 - 1.10 mg/dL   Calcium 9.1 8.4 - 10.5 mg/dL   Total Protein 7.6 6.0 - 8.3 g/dL   Albumin 3.9 3.5 - 5.2 g/dL   AST 22 0 - 37 U/L   ALT 32 0 - 35 U/L   Alkaline Phosphatase 78 39 - 117 U/L   Total Bilirubin 0.5 0.3 - 1.2 mg/dL   GFR calc non Af Amer 78 (L) >90 mL/min   GFR calc Af Amer >90 >90 mL/min    Comment: (NOTE) The eGFR has been calculated using the CKD EPI equation. This calculation has not been validated in all clinical situations. eGFR's persistently <90 mL/min signify possible Chronic Kidney Disease.    Anion gap 4 (L) 5 - 15  Urinalysis, Routine w reflex microscopic     Status: None   Collection Time: 02/22/14  2:20 PM  Result Value Ref Range   Color, Urine YELLOW YELLOW   APPearance CLEAR CLEAR   Specific Gravity, Urine 1.010 1.005 - 1.030   pH 7.0 5.0 - 8.0   Glucose, UA NEGATIVE NEGATIVE mg/dL   Hgb urine dipstick NEGATIVE NEGATIVE   Bilirubin Urine NEGATIVE NEGATIVE   Ketones, ur NEGATIVE NEGATIVE mg/dL   Protein, ur NEGATIVE NEGATIVE mg/dL   Urobilinogen, UA 0.2 0.0 - 1.0 mg/dL   Nitrite NEGATIVE NEGATIVE   Leukocytes, UA NEGATIVE NEGATIVE    Comment: MICROSCOPIC NOT DONE ON URINES WITH NEGATIVE PROTEIN,  BLOOD, LEUKOCYTES, NITRITE, OR GLUCOSE <1000 mg/dL.  CBC with Differential/Platelet     Status: Abnormal   Collection Time: 02/22/14  6:15 PM  Result Value Ref Range   WBC 8.6 4.0 - 10.5 K/uL   RBC 4.27 3.87 - 5.11 MIL/uL   Hemoglobin 10.5 (L) 12.0 - 15.0 g/dL   HCT 32.6 (L) 36.0 - 46.0 %   MCV 76.3 (L) 78.0 - 100.0 fL   MCH 24.6 (L) 26.0 - 34.0 pg   MCHC 32.2 30.0 - 36.0 g/dL   RDW 16.4 (H) 11.5 - 15.5 %   Platelets 356 150  - 400 K/uL   Neutrophils Relative % 61 43 - 77 %   Neutro Abs 5.3 1.7 - 7.7 K/uL   Lymphocytes Relative 28 12 - 46 %   Lymphs Abs 2.4 0.7 - 4.0 K/uL   Monocytes Relative 8 3 - 12 %   Monocytes Absolute 0.7 0.1 - 1.0 K/uL   Eosinophils Relative 2 0 - 5 %   Eosinophils Absolute 0.2 0.0 - 0.7 K/uL   Basophils Relative 1 0 - 1 %   Basophils Absolute 0.0 0.0 - 0.1 K/uL  CBC     Status: Abnormal   Collection Time: 03/12/14  1:05 AM  Result Value Ref Range   WBC 7.4 4.0 - 10.5 K/uL   RBC 4.51 3.87 - 5.11 MIL/uL   Hemoglobin 10.9 (L) 12.0 - 15.0 g/dL   HCT 34.7 (L) 36.0 - 46.0 %   MCV 76.9 (L) 78.0 - 100.0 fL   MCH 24.2 (L) 26.0 - 34.0 pg   MCHC 31.4 30.0 - 36.0 g/dL   RDW 16.1 (H) 11.5 - 15.5 %   Platelets 357 150 - 400 K/uL  Basic metabolic panel     Status: Abnormal   Collection Time: 03/12/14  1:05 AM  Result Value Ref Range   Sodium 138 135 - 145 mmol/L   Potassium 3.4 (L) 3.5 - 5.1 mmol/L   Chloride 104 96 - 112 mmol/L   CO2 29 19 - 32 mmol/L   Glucose, Bld 96 70 - 99 mg/dL   BUN 7 6 - 23 mg/dL   Creatinine, Ser 0.87 0.50 - 1.10 mg/dL   Calcium 8.8 8.4 - 10.5 mg/dL   GFR calc non Af Amer 83 (L) >90 mL/min   GFR calc Af Amer >90 >90 mL/min    Comment: (NOTE) The eGFR has been calculated using the CKD EPI equation. This calculation has not been validated in all clinical situations. eGFR's persistently <90 mL/min signify possible Chronic Kidney Disease.    Anion gap 5 5 - 15  BNP (order ONLY if patient complains of dyspnea/SOB AND you have documented it for THIS visit)     Status: None   Collection Time: 03/12/14  1:05 AM  Result Value Ref Range   B Natriuretic Peptide 13.9 0.0 - 100.0 pg/mL  I-stat troponin, ED (not at [facility])     Status: None   Collection Time: 03/12/14  1:23 AM  Result Value Ref Range   Troponin i, poc 0.00 0.00 - 0.08 ng/mL   Comment 3            Comment: Due to the release kinetics of cTnI, a negative result within the first hours of the onset of  symptoms does not rule out myocardial infarction with certainty. If myocardial infarction is still suspected, repeat the test at appropriate intervals.   Comprehensive metabolic panel     Status: Abnormal  Collection Time: 03/12/14  3:25 AM  Result Value Ref Range   Sodium 137 135 - 145 mmol/L   Potassium 3.4 (L) 3.5 - 5.1 mmol/L   Chloride 104 96 - 112 mmol/L   CO2 29 19 - 32 mmol/L   Glucose, Bld 92 70 - 99 mg/dL   BUN 7 6 - 23 mg/dL   Creatinine, Ser 0.89 0.50 - 1.10 mg/dL   Calcium 8.9 8.4 - 10.5 mg/dL   Total Protein 6.8 6.0 - 8.3 g/dL   Albumin 3.5 3.5 - 5.2 g/dL   AST 16 0 - 37 U/L   ALT 16 0 - 35 U/L   Alkaline Phosphatase 71 39 - 117 U/L   Total Bilirubin 0.4 0.3 - 1.2 mg/dL   GFR calc non Af Amer 81 (L) >90 mL/min   GFR calc Af Amer >90 >90 mL/min    Comment: (NOTE) The eGFR has been calculated using the CKD EPI equation. This calculation has not been validated in all clinical situations. eGFR's persistently <90 mL/min signify possible Chronic Kidney Disease.    Anion gap 4 (L) 5 - 15  Lipase, blood     Status: None   Collection Time: 03/12/14  3:25 AM  Result Value Ref Range   Lipase 22 11 - 59 U/L  D-dimer, quantitative     Status: Abnormal   Collection Time: 03/12/14  3:25 AM  Result Value Ref Range   D-Dimer, Quant 1.61 (H) 0.00 - 0.48 ug/mL-FEU    Comment:        AT THE INHOUSE ESTABLISHED CUTOFF VALUE OF 0.48 ug/mL FEU, THIS ASSAY HAS BEEN DOCUMENTED IN THE LITERATURE TO HAVE A SENSITIVITY AND NEGATIVE PREDICTIVE VALUE OF AT LEAST 98 TO 99%.  THE TEST RESULT SHOULD BE CORRELATED WITH AN ASSESSMENT OF THE CLINICAL PROBABILITY OF DVT / VTE.   Urinalysis, Routine w reflex microscopic     Status: Abnormal   Collection Time: 03/12/14  5:58 AM  Result Value Ref Range   Color, Urine YELLOW YELLOW   APPearance CLEAR CLEAR   Specific Gravity, Urine <1.005 (L) 1.005 - 1.030   pH 7.0 5.0 - 8.0   Glucose, UA NEGATIVE NEGATIVE mg/dL   Hgb urine dipstick  NEGATIVE NEGATIVE   Bilirubin Urine NEGATIVE NEGATIVE   Ketones, ur NEGATIVE NEGATIVE mg/dL   Protein, ur NEGATIVE NEGATIVE mg/dL   Urobilinogen, UA 0.2 0.0 - 1.0 mg/dL   Nitrite NEGATIVE NEGATIVE   Leukocytes, UA NEGATIVE NEGATIVE    Comment: MICROSCOPIC NOT DONE ON URINES WITH NEGATIVE PROTEIN, BLOOD, LEUKOCYTES, NITRITE, OR GLUCOSE <1000 mg/dL.  CBC     Status: Abnormal   Collection Time: 03/18/14 11:48 AM  Result Value Ref Range   WBC 6.5 4.0 - 10.5 K/uL   RBC 4.57 3.87 - 5.11 MIL/uL   Hemoglobin 11.1 (L) 12.0 - 15.0 g/dL   HCT 34.9 (L) 36.0 - 46.0 %   MCV 76.4 (L) 78.0 - 100.0 fL   MCH 24.3 (L) 26.0 - 34.0 pg   MCHC 31.8 30.0 - 36.0 g/dL   RDW 16.2 (H) 11.5 - 15.5 %   Platelets 364 150 - 400 K/uL  Basic metabolic panel     Status: Abnormal   Collection Time: 03/18/14 11:48 AM  Result Value Ref Range   Sodium 138 135 - 145 mmol/L   Potassium 3.8 3.5 - 5.1 mmol/L   Chloride 102 96 - 112 mmol/L   CO2 30 19 - 32 mmol/L   Glucose, Bld 90  70 - 99 mg/dL   BUN 8 6 - 23 mg/dL   Creatinine, Ser 0.95 0.50 - 1.10 mg/dL   Calcium 9.2 8.4 - 10.5 mg/dL   GFR calc non Af Amer 74 (L) >90 mL/min   GFR calc Af Amer 86 (L) >90 mL/min    Comment: (NOTE) The eGFR has been calculated using the CKD EPI equation. This calculation has not been validated in all clinical situations. eGFR's persistently <90 mL/min signify possible Chronic Kidney Disease.    Anion gap 6 5 - 15  BNP (order ONLY if patient complains of dyspnea/SOB AND you have documented it for THIS visit)     Status: None   Collection Time: 03/18/14 11:48 AM  Result Value Ref Range   B Natriuretic Peptide 15.2 0.0 - 100.0 pg/mL  I-stat troponin, ED (not at [facility])     Status: None   Collection Time: 03/18/14 12:20 PM  Result Value Ref Range   Troponin i, poc 0.00 0.00 - 0.08 ng/mL   Comment 3            Comment: Due to the release kinetics of cTnI, a negative result within the first hours of the onset of symptoms does not rule  out myocardial infarction with certainty. If myocardial infarction is still suspected, repeat the test at appropriate intervals.   I-stat troponin, ED     Status: None   Collection Time: 03/18/14  4:02 PM  Result Value Ref Range   Troponin i, poc 0.00 0.00 - 0.08 ng/mL   Comment 3            Comment: Due to the release kinetics of cTnI, a negative result within the first hours of the onset of symptoms does not rule out myocardial infarction with certainty. If myocardial infarction is still suspected, repeat the test at appropriate intervals.        Musculoskeletal: Strength & Muscle Tone: within normal limits Gait & Station: normal Patient leans: N/A  Mental Status Examination;   Psychiatric Specialty Exam: Physical Exam  Constitutional: She appears well-developed and well-nourished. No distress.  HENT:  Head: Normocephalic and atraumatic.  Skin: She is not diaphoretic.    Review of Systems  Constitutional: Negative.   Gastrointestinal: Negative for nausea.  Skin: Negative for rash.  Psychiatric/Behavioral: Positive for depression. Negative for suicidal ideas and substance abuse. The patient is nervous/anxious.     Blood pressure 134/88, pulse 78, height $RemoveBe'5\' 7"'[id number]$  (1.702 m), weight 233 lb (105.688 kg), last menstrual period 10/24/2013.Body mass index is 36.48 kg/(m^2).  General Appearance: Casual  Eye Contact::  Fair  Speech:  Normal Rate  Volume:  Normal  Mood:  Dysphoric  Affect:  Congruent  Thought Process:  Coherent  Orientation:  Full (Time, Place, and Person)  Thought Content:  Rumination  Suicidal Thoughts:  No  Homicidal Thoughts:  No  Memory:  Immediate;   Fair Recent;   Fair  Judgement:  Fair  Insight:  Fair  Psychomotor Activity:  Normal  Concentration:  Fair  Recall:  Fair  Akathisia:  Negative  Handed:  Right  AIMS (if indicated):     Assets:  Communication Skills Desire for Improvement Financial Resources/Insurance Housing Intimacy   Sleep:        Assessment: Axis I: Major depressive disorder recurrent moderate. Generalized anxiety disorder.  Axis II: Deferred  Axis III:  Past Medical History  Diagnosis Date  . Anxiety   . Depression   . Umbilical hernia   .  Anemia     Axis IV: Psychosocial recently moved from [place]. Job stress. Recent hysterectomy   Treatment Plan and Summary: Patient advised not to increase the Wellbutrin from 300 mg in the further as it may cause anxiety. Increase the Zoloft to 100 mg for anxiety and depression.  Do not increase the Klonopin behind twice a day,  She does have refills available from her primary care provider.  I'll see her back for follow up in 3-4 weeks. She will review who to get FMLA filled up as continues to have anxiety going back to work. Her attorney is working on substitute work in a different environment or position as of now Pertinent Labs and Relevant Prior Notes reviewed. Medication Side effects, benefits and risks reviewed/discussed with Patient. Time given for patient to respond and asks questions regarding the Diagnosis and Medications. Safety concerns and to report to ER if suicidal or call 911. Relevant Medications refilled or called in to pharmacy. Discussed weight maintenance and Sleep Hygiene. Follow up with Primary care provider in regards to Medical conditions. Recommend compliance with medications and follow up office appointments. Discussed to avail opportunity to consider or/and continue Individual therapy with Counselor. Greater than 50% of time was spend in counseling and coordination of care with the patient.  Schedule for Follow up visit in 3 weeks or call in earlier as necessary.   Merian Capron, MD 04/09/2014

## 2014-04-21 ENCOUNTER — Institutional Professional Consult (permissible substitution): Payer: Self-pay | Admitting: Interventional Cardiology

## 2014-04-30 ENCOUNTER — Encounter (HOSPITAL_COMMUNITY): Payer: Self-pay | Admitting: Psychiatry

## 2014-05-07 ENCOUNTER — Encounter (HOSPITAL_COMMUNITY): Payer: Self-pay | Admitting: Psychiatry

## 2014-05-07 ENCOUNTER — Ambulatory Visit (INDEPENDENT_AMBULATORY_CARE_PROVIDER_SITE_OTHER): Payer: Federal, State, Local not specified - PPO | Admitting: Psychiatry

## 2014-05-07 VITALS — BP 134/80 | HR 84 | Ht 67.0 in | Wt 232.0 lb

## 2014-05-07 DIAGNOSIS — F411 Generalized anxiety disorder: Secondary | ICD-10-CM | POA: Diagnosis not present

## 2014-05-07 DIAGNOSIS — F331 Major depressive disorder, recurrent, moderate: Secondary | ICD-10-CM

## 2014-05-07 MED ORDER — BUPROPION HCL ER (XL) 150 MG PO TB24: 150.0000 mg | ORAL_TABLET | ORAL | Status: DC

## 2014-05-07 MED ORDER — SERTRALINE HCL 100 MG PO TABS: 100.0000 mg | ORAL_TABLET | Freq: Every day | ORAL | Status: DC

## 2014-05-07 NOTE — Progress Notes (Signed)
Patient ID: Tiffany [name], female   DOB: 1975/12/08, 39 y.o.   MRN: [id number]  [place] [facility] Follow up visit  Zaire Vanbuskirk [id number] 39 y.o.  05/07/2014 11:00 AM  Chief Complaint:  Depression  History of Present Illness:   Patient Presents for follow up and medication management for Derpession and anxiety.   Patient is 39 years old currently married African-American female. She is living with her husband and 4 kids. They have recently moved from [place] she works with [employer] for the last 20 years. Initially presented with the following "Says that she has been on different medications starting couple of years ago when she was having stress related to work she started having panic attacks when her manager giving her a hard time. She also had gone to ED and rule out any cardiac problems and heart back since she was having chest pain palpitations as if she is going to choke or die. After that she was diagnosed with generalized anxiety disorder and follows up with her primary care physician. She did change her job despite that she still has had anxiety. Says that Wellbutrin helped her depression but it did not help her anxiety. She's been on different medications in the past including Paxil that made her body warm. Lexapro made her sleepy Zoloft made her cry."   Patient continues to have depression regarding her job stress. She has needed a letter that she is having difficulty and stress at job. She is apparently taking Zoloft 50 mg. Her FMLA ends  this weekend. She is not reporting side effects Klonopin does help her anxiety but she has apprehension and anxiety and worries regarding going back to work. She still endorses feeling down, anhedonia, weakness disturbed sleep and energy level. Worry and anxiety related to going back to work and panic apprehension  Severity: 4/10 .  10 being no depression. Context: job stress, worried about her job Modifying factors:  supportive husband and family. Less job stress compared to when in [place].  Does not endorse hopelessness suicidal or homicidal thoughts patient does endorse feeling withdrawn and having crying spells.  Does not endorse delusions or hallucinations, there is no psychotic symptoms. There is no current manic symptoms over the past does not endorse any paranoia.        Past Psychiatric History/Hospitalization(s) Denies. Mostly followed with primary care. Have no psych admissions or Suicide attempts.   Hospitalization for psychiatric illness: No History of Electroconvulsive Shock Therapy: No Prior Suicide Attempts: No  Medical History; Past Medical History  Diagnosis Date  . Anxiety   . Depression   . Umbilical hernia   . Anemia     Allergies: Allergies  Allergen Reactions  . Mango Flavor Anaphylaxis and Swelling  . Penicillins Itching and Swelling    Medications: Outpatient Encounter Prescriptions as of 05/07/2014  Medication Sig  . acetaminophen (TYLENOL) 325 MG tablet Take 650 mg by mouth every 6 (six) hours as needed for mild pain.  Marland Kitchen buPROPion (WELLBUTRIN XL) 150 MG 24 hr tablet Take 1 tablet (150 mg total) by mouth every morning.  . clobetasol ointment (TEMOVATE) 2.20 % Apply 1 application topically 2 (two) times daily.   . clonazePAM (KLONOPIN) 0.5 MG tablet Take 1 tablet (0.5 mg total) by mouth daily as needed for anxiety.  . ferrous sulfate 325 (65 FE) MG tablet Take 325 mg by mouth 2 (two) times daily with a meal.  . ibuprofen (ADVIL,MOTRIN) 800 MG tablet Take 1 tablet (800 mg  total) by mouth every 8 (eight) hours as needed.  . Multiple Vitamin (MULTIVITAMIN) capsule Take 1 capsule by mouth daily.  . sertraline (ZOLOFT) 100 MG tablet Take 1 tablet (100 mg total) by mouth daily.  . [DISCONTINUED] buPROPion (WELLBUTRIN XL) 150 MG 24 hr tablet Take 1 tablet (150 mg total) by mouth every morning.  . [DISCONTINUED] sertraline (ZOLOFT) 50 MG tablet Take 50 mg by mouth daily.      Substance Abuse History: Denies. States to be non smoker, no alcohol or using drugs.   Family History; Family History  Problem Relation Age of Onset  . Heart disease Mother 85    heart attack  . Depression Mother   . Hypertension Father 51      Labs:  Recent Results (from the past 2160 hour(s))  Urinalysis, Routine w reflex microscopic     Status: None   Collection Time: 02/20/14  6:50 PM  Result Value Ref Range   Color, Urine YELLOW YELLOW   APPearance CLEAR CLEAR   Specific Gravity, Urine 1.010 1.005 - 1.030   pH 6.5 5.0 - 8.0   Glucose, UA NEGATIVE NEGATIVE mg/dL   Hgb urine dipstick NEGATIVE NEGATIVE   Bilirubin Urine NEGATIVE NEGATIVE   Ketones, ur NEGATIVE NEGATIVE mg/dL   Protein, ur NEGATIVE NEGATIVE mg/dL   Urobilinogen, UA 0.2 0.0 - 1.0 mg/dL   Nitrite NEGATIVE NEGATIVE   Leukocytes, UA NEGATIVE NEGATIVE    Comment: MICROSCOPIC NOT DONE ON URINES WITH NEGATIVE PROTEIN, BLOOD, LEUKOCYTES, NITRITE, OR GLUCOSE <1000 mg/dL.  Urine culture     Status: None   Collection Time: 02/20/14  6:50 PM  Result Value Ref Range   Specimen Description URINE, CLEAN CATCH    Special Requests NONE    Colony Count      80,000 COLONIES/ML Performed at [facility]    Culture      Multiple bacterial morphotypes present, none predominant. Suggest appropriate recollection if clinically indicated. Performed at [facility]    Report Status 02/22/2014 FINAL   CBC with Differential/Platelet     Status: Abnormal   Collection Time: 02/20/14  9:45 PM  Result Value Ref Range   WBC 8.8 4.0 - 10.5 K/uL   RBC 4.34 3.87 - 5.11 MIL/uL   Hemoglobin 10.7 (L) 12.0 - 15.0 g/dL   HCT 33.4 (L) 36.0 - 46.0 %   MCV 77.0 (L) 78.0 - 100.0 fL   MCH 24.7 (L) 26.0 - 34.0 pg   MCHC 32.0 30.0 - 36.0 g/dL   RDW 16.6 (H) 11.5 - 15.5 %   Platelets 368 150 - 400 K/uL   Neutrophils Relative % 67 43 - 77 %   Neutro Abs 6.0 1.7 - 7.7 K/uL   Lymphocytes Relative 21 12 - 46 %    Lymphs Abs 1.8 0.7 - 4.0 K/uL   Monocytes Relative 9 3 - 12 %   Monocytes Absolute 0.8 0.1 - 1.0 K/uL   Eosinophils Relative 2 0 - 5 %   Eosinophils Absolute 0.2 0.0 - 0.7 K/uL   Basophils Relative 1 0 - 1 %   Basophils Absolute 0.0 0.0 - 0.1 K/uL  Comprehensive metabolic panel     Status: Abnormal   Collection Time: 02/20/14  9:45 PM  Result Value Ref Range   Sodium 138 135 - 145 mmol/L   Potassium 4.5 3.5 - 5.1 mmol/L   Chloride 105 96 - 112 mmol/L   CO2 29 19 - 32 mmol/L   Glucose, Bld  100 (H) 70 - 99 mg/dL   BUN 9 6 - 23 mg/dL   Creatinine, Ser 0.91 0.50 - 1.10 mg/dL   Calcium 9.1 8.4 - 10.5 mg/dL   Total Protein 7.6 6.0 - 8.3 g/dL   Albumin 3.9 3.5 - 5.2 g/dL   AST 22 0 - 37 U/L   ALT 32 0 - 35 U/L   Alkaline Phosphatase 78 39 - 117 U/L   Total Bilirubin 0.5 0.3 - 1.2 mg/dL   GFR calc non Af Amer 78 (L) >90 mL/min   GFR calc Af Amer >90 >90 mL/min    Comment: (NOTE) The eGFR has been calculated using the CKD EPI equation. This calculation has not been validated in all clinical situations. eGFR's persistently <90 mL/min signify possible Chronic Kidney Disease.    Anion gap 4 (L) 5 - 15  Urinalysis, Routine w reflex microscopic     Status: None   Collection Time: 02/22/14  2:20 PM  Result Value Ref Range   Color, Urine YELLOW YELLOW   APPearance CLEAR CLEAR   Specific Gravity, Urine 1.010 1.005 - 1.030   pH 7.0 5.0 - 8.0   Glucose, UA NEGATIVE NEGATIVE mg/dL   Hgb urine dipstick NEGATIVE NEGATIVE   Bilirubin Urine NEGATIVE NEGATIVE   Ketones, ur NEGATIVE NEGATIVE mg/dL   Protein, ur NEGATIVE NEGATIVE mg/dL   Urobilinogen, UA 0.2 0.0 - 1.0 mg/dL   Nitrite NEGATIVE NEGATIVE   Leukocytes, UA NEGATIVE NEGATIVE    Comment: MICROSCOPIC NOT DONE ON URINES WITH NEGATIVE PROTEIN, BLOOD, LEUKOCYTES, NITRITE, OR GLUCOSE <1000 mg/dL.  CBC with Differential/Platelet     Status: Abnormal   Collection Time: 02/22/14  6:15 PM  Result Value Ref Range   WBC 8.6 4.0 - 10.5 K/uL    RBC 4.27 3.87 - 5.11 MIL/uL   Hemoglobin 10.5 (L) 12.0 - 15.0 g/dL   HCT 32.6 (L) 36.0 - 46.0 %   MCV 76.3 (L) 78.0 - 100.0 fL   MCH 24.6 (L) 26.0 - 34.0 pg   MCHC 32.2 30.0 - 36.0 g/dL   RDW 16.4 (H) 11.5 - 15.5 %   Platelets 356 150 - 400 K/uL   Neutrophils Relative % 61 43 - 77 %   Neutro Abs 5.3 1.7 - 7.7 K/uL   Lymphocytes Relative 28 12 - 46 %   Lymphs Abs 2.4 0.7 - 4.0 K/uL   Monocytes Relative 8 3 - 12 %   Monocytes Absolute 0.7 0.1 - 1.0 K/uL   Eosinophils Relative 2 0 - 5 %   Eosinophils Absolute 0.2 0.0 - 0.7 K/uL   Basophils Relative 1 0 - 1 %   Basophils Absolute 0.0 0.0 - 0.1 K/uL  CBC     Status: Abnormal   Collection Time: 03/12/14  1:05 AM  Result Value Ref Range   WBC 7.4 4.0 - 10.5 K/uL   RBC 4.51 3.87 - 5.11 MIL/uL   Hemoglobin 10.9 (L) 12.0 - 15.0 g/dL   HCT 34.7 (L) 36.0 - 46.0 %   MCV 76.9 (L) 78.0 - 100.0 fL   MCH 24.2 (L) 26.0 - 34.0 pg   MCHC 31.4 30.0 - 36.0 g/dL   RDW 16.1 (H) 11.5 - 15.5 %   Platelets 357 150 - 400 K/uL  Basic metabolic panel     Status: Abnormal   Collection Time: 03/12/14  1:05 AM  Result Value Ref Range   Sodium 138 135 - 145 mmol/L   Potassium 3.4 (L) 3.5 - 5.1  mmol/L   Chloride 104 96 - 112 mmol/L   CO2 29 19 - 32 mmol/L   Glucose, Bld 96 70 - 99 mg/dL   BUN 7 6 - 23 mg/dL   Creatinine, Ser 0.87 0.50 - 1.10 mg/dL   Calcium 8.8 8.4 - 10.5 mg/dL   GFR calc non Af Amer 83 (L) >90 mL/min   GFR calc Af Amer >90 >90 mL/min    Comment: (NOTE) The eGFR has been calculated using the CKD EPI equation. This calculation has not been validated in all clinical situations. eGFR's persistently <90 mL/min signify possible Chronic Kidney Disease.    Anion gap 5 5 - 15  BNP (order ONLY if patient complains of dyspnea/SOB AND you have documented it for THIS visit)     Status: None   Collection Time: 03/12/14  1:05 AM  Result Value Ref Range   B Natriuretic Peptide 13.9 0.0 - 100.0 pg/mL  I-stat troponin, ED (not at [facility])     Status:  None   Collection Time: 03/12/14  1:23 AM  Result Value Ref Range   Troponin i, poc 0.00 0.00 - 0.08 ng/mL   Comment 3            Comment: Due to the release kinetics of cTnI, a negative result within the first hours of the onset of symptoms does not rule out myocardial infarction with certainty. If myocardial infarction is still suspected, repeat the test at appropriate intervals.   Comprehensive metabolic panel     Status: Abnormal   Collection Time: 03/12/14  3:25 AM  Result Value Ref Range   Sodium 137 135 - 145 mmol/L   Potassium 3.4 (L) 3.5 - 5.1 mmol/L   Chloride 104 96 - 112 mmol/L   CO2 29 19 - 32 mmol/L   Glucose, Bld 92 70 - 99 mg/dL   BUN 7 6 - 23 mg/dL   Creatinine, Ser 0.89 0.50 - 1.10 mg/dL   Calcium 8.9 8.4 - 10.5 mg/dL   Total Protein 6.8 6.0 - 8.3 g/dL   Albumin 3.5 3.5 - 5.2 g/dL   AST 16 0 - 37 U/L   ALT 16 0 - 35 U/L   Alkaline Phosphatase 71 39 - 117 U/L   Total Bilirubin 0.4 0.3 - 1.2 mg/dL   GFR calc non Af Amer 81 (L) >90 mL/min   GFR calc Af Amer >90 >90 mL/min    Comment: (NOTE) The eGFR has been calculated using the CKD EPI equation. This calculation has not been validated in all clinical situations. eGFR's persistently <90 mL/min signify possible Chronic Kidney Disease.    Anion gap 4 (L) 5 - 15  Lipase, blood     Status: None   Collection Time: 03/12/14  3:25 AM  Result Value Ref Range   Lipase 22 11 - 59 U/L  D-dimer, quantitative     Status: Abnormal   Collection Time: 03/12/14  3:25 AM  Result Value Ref Range   D-Dimer, Quant 1.61 (H) 0.00 - 0.48 ug/mL-FEU    Comment:        AT THE INHOUSE ESTABLISHED CUTOFF VALUE OF 0.48 ug/mL FEU, THIS ASSAY HAS BEEN DOCUMENTED IN THE LITERATURE TO HAVE A SENSITIVITY AND NEGATIVE PREDICTIVE VALUE OF AT LEAST 98 TO 99%.  THE TEST RESULT SHOULD BE CORRELATED WITH AN ASSESSMENT OF THE CLINICAL PROBABILITY OF DVT / VTE.   Urinalysis, Routine w reflex microscopic     Status: Abnormal    Collection Time: 03/12/14  5:58 AM  Result Value Ref Range   Color, Urine YELLOW YELLOW   APPearance CLEAR CLEAR   Specific Gravity, Urine <1.005 (L) 1.005 - 1.030   pH 7.0 5.0 - 8.0   Glucose, UA NEGATIVE NEGATIVE mg/dL   Hgb urine dipstick NEGATIVE NEGATIVE   Bilirubin Urine NEGATIVE NEGATIVE   Ketones, ur NEGATIVE NEGATIVE mg/dL   Protein, ur NEGATIVE NEGATIVE mg/dL   Urobilinogen, UA 0.2 0.0 - 1.0 mg/dL   Nitrite NEGATIVE NEGATIVE   Leukocytes, UA NEGATIVE NEGATIVE    Comment: MICROSCOPIC NOT DONE ON URINES WITH NEGATIVE PROTEIN, BLOOD, LEUKOCYTES, NITRITE, OR GLUCOSE <1000 mg/dL.  CBC     Status: Abnormal   Collection Time: 03/18/14 11:48 AM  Result Value Ref Range   WBC 6.5 4.0 - 10.5 K/uL   RBC 4.57 3.87 - 5.11 MIL/uL   Hemoglobin 11.1 (L) 12.0 - 15.0 g/dL   HCT 34.9 (L) 36.0 - 46.0 %   MCV 76.4 (L) 78.0 - 100.0 fL   MCH 24.3 (L) 26.0 - 34.0 pg   MCHC 31.8 30.0 - 36.0 g/dL   RDW 16.2 (H) 11.5 - 15.5 %   Platelets 364 150 - 400 K/uL  Basic metabolic panel     Status: Abnormal   Collection Time: 03/18/14 11:48 AM  Result Value Ref Range   Sodium 138 135 - 145 mmol/L   Potassium 3.8 3.5 - 5.1 mmol/L   Chloride 102 96 - 112 mmol/L   CO2 30 19 - 32 mmol/L   Glucose, Bld 90 70 - 99 mg/dL   BUN 8 6 - 23 mg/dL   Creatinine, Ser 0.95 0.50 - 1.10 mg/dL   Calcium 9.2 8.4 - 10.5 mg/dL   GFR calc non Af Amer 74 (L) >90 mL/min   GFR calc Af Amer 86 (L) >90 mL/min    Comment: (NOTE) The eGFR has been calculated using the CKD EPI equation. This calculation has not been validated in all clinical situations. eGFR's persistently <90 mL/min signify possible Chronic Kidney Disease.    Anion gap 6 5 - 15  BNP (order ONLY if patient complains of dyspnea/SOB AND you have documented it for THIS visit)     Status: None   Collection Time: 03/18/14 11:48 AM  Result Value Ref Range   B Natriuretic Peptide 15.2 0.0 - 100.0 pg/mL  I-stat troponin, ED (not at [facility])     Status: None    Collection Time: 03/18/14 12:20 PM  Result Value Ref Range   Troponin i, poc 0.00 0.00 - 0.08 ng/mL   Comment 3            Comment: Due to the release kinetics of cTnI, a negative result within the first hours of the onset of symptoms does not rule out myocardial infarction with certainty. If myocardial infarction is still suspected, repeat the test at appropriate intervals.   I-stat troponin, ED     Status: None   Collection Time: 03/18/14  4:02 PM  Result Value Ref Range   Troponin i, poc 0.00 0.00 - 0.08 ng/mL   Comment 3            Comment: Due to the release kinetics of cTnI, a negative result within the first hours of the onset of symptoms does not rule out myocardial infarction with certainty. If myocardial infarction is still suspected, repeat the test at appropriate intervals.        Musculoskeletal: Strength & Muscle Tone: within normal limits Gait & Station:  normal Patient leans: N/A  Mental Status Examination;   Psychiatric Specialty Exam: Physical Exam  Constitutional: She appears well-developed and well-nourished. No distress.  HENT:  Head: Normocephalic and atraumatic.  Skin: She is not diaphoretic.    Review of Systems  Constitutional: Negative.   Psychiatric/Behavioral: Positive for depression. The patient is nervous/anxious.     Blood pressure 134/80, pulse 84, height $RemoveBe'5\' 7"'rvuatqsLM$  (1.702 m), weight 232 lb (105.235 kg), last menstrual period 10/24/2013.Body mass index is 36.33 kg/(m^2).  General Appearance: Casual  Eye Contact::  Fair  Speech:  Normal Rate  Volume:  Normal  Mood:  Dysphoric  Affect:  Congruent  Thought Process:  Coherent  Orientation:  Full (Time, Place, and Person)  Thought Content:  Rumination  Suicidal Thoughts:  No  Homicidal Thoughts:  No  Memory:  Immediate;   Fair Recent;   Fair  Judgement:  Fair  Insight:  Fair  Psychomotor Activity:  Normal  Concentration:  Fair  Recall:  Fair  Akathisia:  Negative  Handed:  Right   AIMS (if indicated):     Assets:  Communication Skills Desire for Improvement Financial Resources/Insurance Housing Intimacy  Sleep:        Assessment: Axis I: Major depressive disorder recurrent moderate. Generalized anxiety disorder.  Axis II: Deferred  Axis III:  Past Medical History  Diagnosis Date  . Anxiety   . Depression   . Umbilical hernia   . Anemia     Axis IV: Psychosocial recently moved from [place]. Job stress. Recent hysterectomy   Treatment Plan and Summary: Patient advised not to increase the Wellbutrin from 300 mg in the further as it may cause anxiety. She is taking zoloft $RemoveBefor'50mg'IvbBgRcVEJux$ . Advised to take $Remov'100mg'LmbimE$  .  She would try going to work next Monday and see if she can handle the stress. Otherwise would need FMLA  Again or a different work environment.   Do not increase the Klonopin behind twice a day,  She does have refills available from her primary care provider.  I'll see her back for follow up in 3-4 weeks. Pertinent Labs and Relevant Prior Notes reviewed. Medication Side effects, benefits and risks reviewed/discussed with Patient. Time given for patient to respond and asks questions regarding the Diagnosis and Medications. Safety concerns and to report to ER if suicidal or call 911. Relevant Medications refilled or called in to pharmacy. Discussed weight maintenance and Sleep Hygiene. Follow up with Primary care provider in regards to Medical conditions. Recommend compliance with medications and follow up office appointments. Discussed to avail opportunity to consider or/and continue Individual therapy with Counselor. Greater than 50% of time was spend in counseling and coordination of care with the patient.  Schedule for Follow up visit in 3 weeks or call in earlier as necessary.   Merian Capron, MD 05/07/2014

## 2014-05-10 ENCOUNTER — Emergency Department (HOSPITAL_COMMUNITY)
Admission: EM | Admit: 2014-05-10 | Discharge: 2014-05-10 | Disposition: A | Discharge status: Home / Self Care | Payer: Federal, State, Local not specified - PPO | Attending: Emergency Medicine | Admitting: Emergency Medicine

## 2014-05-10 ENCOUNTER — Encounter (HOSPITAL_COMMUNITY): Payer: Self-pay | Admitting: *Deleted

## 2014-05-10 DIAGNOSIS — D649 Anemia, unspecified: Secondary | ICD-10-CM | POA: Insufficient documentation

## 2014-05-10 DIAGNOSIS — Z880 Allergy status to penicillin: Secondary | ICD-10-CM | POA: Diagnosis not present

## 2014-05-10 DIAGNOSIS — Z79899 Other long term (current) drug therapy: Secondary | ICD-10-CM | POA: Insufficient documentation

## 2014-05-10 DIAGNOSIS — F419 Anxiety disorder, unspecified: Secondary | ICD-10-CM | POA: Diagnosis not present

## 2014-05-10 DIAGNOSIS — R079 Chest pain, unspecified: Secondary | ICD-10-CM | POA: Insufficient documentation

## 2014-05-10 DIAGNOSIS — Z8719 Personal history of other diseases of the digestive system: Secondary | ICD-10-CM | POA: Diagnosis not present

## 2014-05-10 DIAGNOSIS — R1084 Generalized abdominal pain: Secondary | ICD-10-CM | POA: Insufficient documentation

## 2014-05-10 DIAGNOSIS — F329 Major depressive disorder, single episode, unspecified: Secondary | ICD-10-CM | POA: Diagnosis not present

## 2014-05-10 LAB — URINALYSIS, ROUTINE W REFLEX MICROSCOPIC
Bilirubin Urine: NEGATIVE
GLUCOSE, UA: NEGATIVE mg/dL
Hgb urine dipstick: NEGATIVE
KETONES UR: NEGATIVE mg/dL
LEUKOCYTES UA: NEGATIVE
NITRITE: NEGATIVE
PROTEIN: NEGATIVE mg/dL
Specific Gravity, Urine: 1.019 (ref 1.005–1.030)
UROBILINOGEN UA: 0.2 mg/dL (ref 0.0–1.0)
pH: 8 (ref 5.0–8.0)

## 2014-05-10 LAB — COMPREHENSIVE METABOLIC PANEL
ALBUMIN: 3.7 g/dL (ref 3.5–5.2)
ALT: 15 U/L (ref 0–35)
AST: 18 U/L (ref 0–37)
Alkaline Phosphatase: 66 U/L (ref 39–117)
Anion gap: 9 (ref 5–15)
BUN: 6 mg/dL (ref 6–23)
CALCIUM: 8.8 mg/dL (ref 8.4–10.5)
CO2: 25 mmol/L (ref 19–32)
Chloride: 104 mmol/L (ref 96–112)
Creatinine, Ser: 0.91 mg/dL (ref 0.50–1.10)
GFR calc Af Amer: >90 mL/min (ref >90)
GFR calc non Af Amer: 78 mL/min — ABNORMAL LOW (ref >90)
Glucose, Bld: 89 mg/dL (ref 70–99)
Potassium: 3.9 mmol/L (ref 3.5–5.1)
Sodium: 138 mmol/L (ref 135–145)
TOTAL PROTEIN: 7.1 g/dL (ref 6.0–8.3)
Total Bilirubin: 0.4 mg/dL (ref 0.3–1.2)

## 2014-05-10 LAB — CBC WITH DIFFERENTIAL/PLATELET
BASOS ABS: 0.1 10*3/uL (ref 0.0–0.1)
Basophils Relative: 1 % (ref 0–1)
Eosinophils Absolute: 0.1 10*3/uL (ref 0.0–0.7)
Eosinophils Relative: 1 % (ref 0–5)
HCT: 34.6 % — ABNORMAL LOW (ref 36.0–46.0)
Hemoglobin: 11.1 g/dL — ABNORMAL LOW (ref 12.0–15.0)
Lymphocytes Relative: 25 % (ref 12–46)
Lymphs Abs: 2.1 10*3/uL (ref 0.7–4.0)
MCH: 24.1 pg — ABNORMAL LOW (ref 26.0–34.0)
MCHC: 32.1 g/dL (ref 30.0–36.0)
MCV: 75.1 fL — ABNORMAL LOW (ref 78.0–100.0)
MONO ABS: 1 10*3/uL (ref 0.1–1.0)
Monocytes Relative: 11 % (ref 3–12)
Neutro Abs: 5.4 10*3/uL (ref 1.7–7.7)
Neutrophils Relative %: 62 % (ref 43–77)
Platelets: 369 10*3/uL (ref 150–400)
RBC: 4.61 MIL/uL (ref 3.87–5.11)
RDW: 16.6 % — AB (ref 11.5–15.5)
WBC: 8.7 10*3/uL (ref 4.0–10.5)

## 2014-05-10 LAB — LIPASE, BLOOD: Lipase: 27 U/L (ref 11–59)

## 2014-05-10 LAB — I-STAT TROPONIN, ED: TROPONIN I, POC: 0 ng/mL (ref 0.00–0.08)

## 2014-05-10 MED ORDER — DICYCLOMINE HCL 10 MG/ML IM SOLN
20.0000 mg | Freq: Once | INTRAMUSCULAR | Status: AC
  Administered 2014-05-10: 20 mg via INTRAMUSCULAR
  Filled 2014-05-10: qty 2

## 2014-05-10 MED ORDER — SODIUM CHLORIDE 0.9 % IV BOLUS (SEPSIS)
1000.0000 mL | Freq: Once | INTRAVENOUS | Status: AC
  Administered 2014-05-10: 1000 mL via INTRAVENOUS

## 2014-05-10 NOTE — Discharge Instructions (Signed)
Chest Pain (Nonspecific) Keep your cardiology appointment at [facility] on 4/26.  Take tylenol or motrin for pain. Return for worsening chest pain or shortness of breath.  It is often hard to give a specific diagnosis for the cause of chest pain. There is always a chance that your pain could be related to something serious, such as a heart attack or a blood clot in the lungs. You need to follow up with your health care provider for further evaluation. CAUSES   Heartburn.  Pneumonia or bronchitis.  Anxiety or stress.  Inflammation around your heart (pericarditis) or lung (pleuritis or pleurisy).  A blood clot in the lung.  A collapsed lung (pneumothorax). It can develop suddenly on its own (spontaneous pneumothorax) or from trauma to the chest.  Shingles infection (herpes zoster virus). The chest wall is composed of bones, muscles, and cartilage. Any of these can be the source of the pain.  The bones can be bruised by injury.  The muscles or cartilage can be strained by coughing or overwork.  The cartilage can be affected by inflammation and become sore (costochondritis). DIAGNOSIS  Lab tests or other studies may be needed to find the cause of your pain. Your health care provider may have you take a test called an ambulatory electrocardiogram (ECG). An ECG records your heartbeat patterns over a 24-hour period. You may also have other tests, such as:  Transthoracic echocardiogram (TTE). During echocardiography, sound waves are used to evaluate how blood flows through your heart.  Transesophageal echocardiogram (TEE).  Cardiac monitoring. This allows your health care provider to monitor your heart rate and rhythm in real time.  Holter monitor. This is a portable device that records your heartbeat and can help diagnose heart arrhythmias. It allows your health care provider to track your heart activity for several days, if needed.  Stress tests by exercise or by giving  medicine that makes the heart beat faster. TREATMENT   Treatment depends on what may be causing your chest pain. Treatment may include:  Acid blockers for heartburn.  Anti-inflammatory medicine.  Pain medicine for inflammatory conditions.  Antibiotics if an infection is present.  You may be advised to change lifestyle habits. This includes stopping smoking and avoiding alcohol, caffeine, and chocolate.  You may be advised to keep your head raised (elevated) when sleeping. This reduces the chance of acid going backward from your stomach into your esophagus. Most of the time, nonspecific chest pain will improve within 2-3 days with rest and mild pain medicine.  HOME CARE INSTRUCTIONS   If antibiotics were prescribed, take them as directed. Finish them even if you start to feel better.  For the next few days, avoid physical activities that bring on chest pain. Continue physical activities as directed.  Do not use any tobacco products, including cigarettes, chewing tobacco, or electronic cigarettes.  Avoid drinking alcohol.  Only take medicine as directed by your health care provider.  Follow your health care provider's suggestions for further testing if your chest pain does not go away.  Keep any follow-up appointments you made. If you do not go to an appointment, you could develop lasting (chronic) problems with pain. If there is any problem keeping an appointment, call to reschedule. SEEK MEDICAL CARE IF:   Your chest pain does not go away, even after treatment.  You have a rash with blisters on your chest.  You have a fever. SEEK IMMEDIATE MEDICAL CARE IF:   You have increased chest pain or  pain that spreads to your arm, neck, jaw, back, or abdomen.  You have shortness of breath.  You have an increasing cough, or you cough up blood.  You have severe back or abdominal pain.  You feel nauseous or vomit.  You have severe weakness.  You faint.  You have  chills. This is an emergency. Do not wait to see if the pain will go away. Get medical help at once. Call your local emergency services (911 in [place] Do not drive yourself to the hospital. MAKE SURE YOU:   Understand these instructions.  Will watch your condition.  Will get help right away if you are not doing well or get worse. Document Released: 10/19/2004 Document Revised: 01/14/2013 Document Reviewed: 08/15/2007 [facility] Patient Information 2015 [place], [place]. This information is not intended to replace advice given to you by your health care provider. Make sure you discuss any questions you have with your health care provider.

## 2014-05-10 NOTE — ED Notes (Signed)
The pt is c/o chest and abd pain since yesterday with nv and diarrhea.  Hx of anxiety   With these same symptoms.  She takes anxiety med but today it has not helped  lmp none

## 2014-05-10 NOTE — ED Provider Notes (Signed)
CSN: [id number]     Arrival date & time 05/10/14  1830 History   First MD Initiated Contact with Patient 05/10/14 1928     Chief Complaint  Patient presents with  . Abdominal Pain     (Consider location/radiation/quality/duration/timing/severity/associated sxs/prior Treatment) HPI Tiffany [name] is a 39 y.o female with a history anxiety and depression who presents with chest pain that began yesterday and abdominal pain with vomiting and diarrhea that began today. She says she has been under a lot of stress after having a hysterectomy in December 2015 and job was upset with her because she took a lot of time off and she has not been able to go back. She says nothing makes her symptoms better or worse.  He last bowel movement was while in the waiting room.   She denies any fever, chills, diaphoresis, headache, shortness of breath, constipation, dysuria, hematuria, or urinary frequency. Her mother died from heart disease at the age of 72.  She is currently being followed by cardiology and had an abnormal stress test last week.  She also had an ECHO and has follow up next week.  Past Medical History  Diagnosis Date  . Anxiety   . Depression   . Umbilical hernia   . Anemia    Past Surgical History  Procedure Laterality Date  . Cesarean section      2003  . Tubal ligation      2013  . Appendectomy      2002  . Laparoscopic hysterectomy N/A 01/06/2014    Procedure: Attempted  TOTAL LAPAROSCOPIC Hysterectomy,;  Surgeon: Delice Lesch, MD;  Location: [facility];  Service: Gynecology;  Laterality: N/A;  . Abdominal hysterectomy Bilateral 01/06/2014    Procedure: HYSTERECTOMY ABDOMINAL WITH BILATERAL SALPINGECTOMY ;  Surgeon: Delice Lesch, MD;  Location: [facility];  Service: Gynecology;  Laterality: Bilateral;  . Transverse colon resection N/A 01/06/2014    Procedure: OVERSEW OF SEROSA TEAR OF THE TRANSVERSE COLON ;  Surgeon: Alphonsa Overall, MD;  Location: [facility];  Service: General;  Laterality: N/A;   . Abdominal hysterectomy  2016   Family History  Problem Relation Age of Onset  . Heart disease Mother 65    heart attack  . Depression Mother   . Hypertension Father 36   History  Substance Use Topics  . Smoking status: Never Smoker   . Smokeless tobacco: Never Used  . Alcohol Use: No   OB History    Gravida Para Term Preterm AB TAB SAB Ectopic Multiple Living   4    1  1   4      Review of Systems  All other systems reviewed and are negative.     Allergies  Mango flavor and Penicillins  Home Medications   Prior to Admission medications   Medication Sig Start Date End Date Taking? Authorizing Provider  acetaminophen (TYLENOL) 325 MG tablet Take 650 mg by mouth every 6 (six) hours as needed for mild pain.    Historical Provider, MD  buPROPion (WELLBUTRIN XL) 150 MG 24 hr tablet Take 1 tablet (150 mg total) by mouth every morning. 05/07/14 05/07/15  Merian Capron, MD  clobetasol ointment (TEMOVATE) 2.50 % Apply 1 application topically 2 (two) times daily.     Historical Provider, MD  clonazePAM (KLONOPIN) 0.5 MG tablet Take 1 tablet (0.5 mg total) by mouth daily as needed for anxiety. 02/19/14   Camelia Eng Tysinger, PA-C  ferrous sulfate 325 (65 FE) MG tablet Take 325  mg by mouth 2 (two) times daily with a meal.    Historical Provider, MD  ibuprofen (ADVIL,MOTRIN) 800 MG tablet Take 1 tablet (800 mg total) by mouth every 8 (eight) hours as needed. 01/28/14   Gavin Pound, CNM  Multiple Vitamin (MULTIVITAMIN) capsule Take 1 capsule by mouth daily.    Historical Provider, MD  sertraline (ZOLOFT) 100 MG tablet Take 1 tablet (100 mg total) by mouth daily. 05/07/14   Merian Capron, MD   BP 127/74 mmHg  Pulse 70  Temp(Src) 98.4 F (36.9 C) (Oral)  Resp 18  Wt 228 lb 4 oz (103.534 kg)  SpO2 100%  LMP 10/24/2013 Physical Exam  Constitutional: She is oriented to person, place, and time. She appears well-developed and well-nourished.  HENT:  Head: Normocephalic and atraumatic.   Eyes: Conjunctivae are normal.  Neck: Normal range of motion. Neck supple.  Cardiovascular: Normal rate, regular rhythm and normal heart sounds.   Pulmonary/Chest: Effort normal and breath sounds normal. No respiratory distress. She has no wheezes. She exhibits no tenderness.  Abdominal: Soft. She exhibits no distension and no mass. There is generalized tenderness. There is no guarding, no tenderness at McBurney's point and negative Murphy's sign. No hernia.  Obese.  Musculoskeletal: Normal range of motion.  Neurological: She is alert and oriented to person, place, and time.  Skin: Skin is warm and dry.  Nursing note and vitals reviewed.   ED Course  Procedures (including critical care time) Labs Review Labs Reviewed  CBC WITH DIFFERENTIAL/PLATELET - Abnormal; Notable for the following:    Hemoglobin 11.1 (*)    HCT 34.6 (*)    MCV 75.1 (*)    MCH 24.1 (*)    RDW 16.6 (*)    All other components within normal limits  COMPREHENSIVE METABOLIC PANEL - Abnormal; Notable for the following:    GFR calc non Af Amer 78 (*)    All other components within normal limits  URINALYSIS, ROUTINE W REFLEX MICROSCOPIC - Abnormal; Notable for the following:    APPearance CLOUDY (*)    All other components within normal limits  LIPASE, BLOOD  I-STAT TROPOININ, ED    Imaging Review No results found. EKG interpretation: 10-May-2014 18:39:30  Vent. rate 83 BPM PR interval 158 ms QRS duration 74 ms QT/QTc 354/415 ms P-R-T axes 65 81 68 Normal sinus rhythm Low voltage QRS I have personally interpreted the EKG.  MDM   Final diagnoses:  Chest pain, unspecified chest pain type  Generalized abdominal pain  Patient presents with chest pain and generalized abdominal pain.  I do not think this is cardiac related and she is currently being worked up for this by her cardiologist at [facility]. Her troponin is negative and her EKG shows nothing acute. I think it is reasonable to send her home since  she has a cardiology appointment on 4/26 to discuss her ECHO and stress test results from last week. She has no focal abdominal pain.  No UTI and her labs are within normal limits.   After fluids, zofran, and bentyl she is feeling better.  I told her that she is slightly anemic.  She denies any rectal bleeding or hematemesis. She can take motrin or tylenol for pain and she agrees with the plan to follow up with cardiology.      Ottie Glazier, PA-C 05/10/14 2349  Dorie Rank, MD 05/13/14 [phone]

## 2014-05-13 ENCOUNTER — Ambulatory Visit (INDEPENDENT_AMBULATORY_CARE_PROVIDER_SITE_OTHER): Payer: Federal, State, Local not specified - PPO | Admitting: Psychiatry

## 2014-05-13 ENCOUNTER — Encounter (HOSPITAL_COMMUNITY): Payer: Self-pay | Admitting: Psychiatry

## 2014-05-13 VITALS — HR 80 | Ht 67.0 in | Wt 228.0 lb

## 2014-05-13 DIAGNOSIS — F411 Generalized anxiety disorder: Secondary | ICD-10-CM | POA: Diagnosis not present

## 2014-05-13 DIAGNOSIS — F4310 Post-traumatic stress disorder, unspecified: Secondary | ICD-10-CM | POA: Diagnosis not present

## 2014-05-13 DIAGNOSIS — F331 Major depressive disorder, recurrent, moderate: Secondary | ICD-10-CM

## 2014-05-13 MED ORDER — SERTRALINE HCL 100 MG PO TABS: 150.0000 mg | ORAL_TABLET | Freq: Every day | ORAL | Status: DC

## 2014-05-13 MED ORDER — CLONAZEPAM 0.5 MG PO TABS: 0.5000 mg | ORAL_TABLET | Freq: Every day | ORAL | Status: DC | PRN

## 2014-05-13 NOTE — Progress Notes (Signed)
Patient ID: Tiffany [name], female   DOB: [birth year], 39 y.o.   MRN: [id number]  [facility] up visit  Tiffany [name] [id number] 39 y.o.  05/13/2014 10:40 AM  Chief Complaint:  Depression  History of Present Illness:   Patient Presents for follow up and medication management for Derpession and anxiety.   Patient is 39 years old currently married African-American female. She is living with her husband and 4 kids. They have recently moved from [place] she works with [employer] for the last 20 years. Initially presented with the following "Says that she has been on different medications starting couple of years ago when she was having stress related to work she started having panic attacks when her manager giving her a hard time. She also had gone to ED and rule out any cardiac problems and heart back since she was having chest pain palpitations as if she is going to choke or die. After that she was diagnosed with generalized anxiety disorder and follows up with her primary care physician. She did change her job despite that she still has had anxiety. Says that Wellbutrin helped her depression but it did not help her anxiety. She's been on different medications in the past including Paxil that made her body warm. Lexapro made her sleepy Zoloft made her cry."  Recently she went back to work but had another incident with a supervisor that led her to have panic attack and chest pain and anxiety she ended up in the ED. Cardiac enzymes were done she was given pain medication and antianxiety medication. She still has concern about going back to work feeling extremely anxious and stressed out just to think about it. She is looking to change her job so that she cannot be stressed out but overall she feels panicky regarding going back to work she does okay when she is by herself at home she does have a supportive family and her husband. She does see a therapist who has told  her also not to go back to work. Patient wants to fill out FMLA paper for the next 2 or 3 weeks so that she can abstain from work in order not to decompensate further and she got worried when she was having the chest pain and ended up in the hospital. She also has been taking Wellbutrin 300 mg still although I advised her not to take 300 instead she is supposed to take 150 mg as higher doses can cause anxiety. He has been taking Klonopin regularly since this incident   Worry and anxiety related to going back to work and panic apprehension is her main stressor.  Severity: 4/10 .  10 being no depression. Anxiety ; 6 to 8/10 . 10 being extreme anxiety Context: job stress, worried about her job Modifying factors: supportive husband and family. Does not endorse hopelessness suicidal or homicidal thoughts. Does not endorse delusions or hallucinations, there is no psychotic symptoms. There is no current manic symptoms over the past does not endorse any paranoia.        Past Psychiatric History/Hospitalization(s) Denies. Mostly followed with primary care. Have no psych admissions or Suicide attempts.   Hospitalization for psychiatric illness: No History of Electroconvulsive Shock Therapy: No Prior Suicide Attempts: No  Medical History; Past Medical History  Diagnosis Date  . Anxiety   . Depression   . Umbilical hernia   . Anemia     Allergies: Allergies  Allergen Reactions  . Mango Flavor Anaphylaxis and  Swelling  . Penicillins Itching and Swelling    Medications: Outpatient Encounter Prescriptions as of 05/13/2014  Medication Sig  . acetaminophen (TYLENOL) 325 MG tablet Take 650 mg by mouth every 6 (six) hours as needed for mild pain.  Marland Kitchen buPROPion (WELLBUTRIN XL) 150 MG 24 hr tablet Take 1 tablet (150 mg total) by mouth every morning.  . clobetasol ointment (TEMOVATE) 3.09 % Apply 1 application topically 2 (two) times daily.   . clonazePAM (KLONOPIN) 0.5 MG tablet Take 1 tablet  (0.5 mg total) by mouth daily as needed for anxiety.  . ferrous sulfate 325 (65 FE) MG tablet Take 325 mg by mouth 2 (two) times daily with a meal.  . ibuprofen (ADVIL,MOTRIN) 800 MG tablet Take 1 tablet (800 mg total) by mouth every 8 (eight) hours as needed.  . Multiple Vitamin (MULTIVITAMIN) capsule Take 1 capsule by mouth daily.  . sertraline (ZOLOFT) 100 MG tablet Take 1.5 tablets (150 mg total) by mouth daily.  . [DISCONTINUED] clonazePAM (KLONOPIN) 0.5 MG tablet Take 1 tablet (0.5 mg total) by mouth daily as needed for anxiety.  . [DISCONTINUED] sertraline (ZOLOFT) 100 MG tablet Take 1 tablet (100 mg total) by mouth daily.     Substance Abuse History: Denies. States to be non smoker, no alcohol or using drugs.   Family History; Family History  Problem Relation Age of Onset  . Heart disease Mother 73    heart attack  . Depression Mother   . Hypertension Father 22      Labs:  Recent Results (from the past 2160 hour(s))  Urinalysis, Routine w reflex microscopic     Status: None   Collection Time: 02/20/14  6:50 PM  Result Value Ref Range   Color, Urine YELLOW YELLOW   APPearance CLEAR CLEAR   Specific Gravity, Urine 1.010 1.005 - 1.030   pH 6.5 5.0 - 8.0   Glucose, UA NEGATIVE NEGATIVE mg/dL   Hgb urine dipstick NEGATIVE NEGATIVE   Bilirubin Urine NEGATIVE NEGATIVE   Ketones, ur NEGATIVE NEGATIVE mg/dL   Protein, ur NEGATIVE NEGATIVE mg/dL   Urobilinogen, UA 0.2 0.0 - 1.0 mg/dL   Nitrite NEGATIVE NEGATIVE   Leukocytes, UA NEGATIVE NEGATIVE    Comment: MICROSCOPIC NOT DONE ON URINES WITH NEGATIVE PROTEIN, BLOOD, LEUKOCYTES, NITRITE, OR GLUCOSE <1000 mg/dL.  Urine culture     Status: None   Collection Time: 02/20/14  6:50 PM  Result Value Ref Range   Specimen Description URINE, CLEAN CATCH    Special Requests NONE    Colony Count      80,000 COLONIES/ML Performed at [facility]    Culture      Multiple bacterial morphotypes present, none predominant.  Suggest appropriate recollection if clinically indicated. Performed at [facility]    Report Status 02/22/2014 FINAL   CBC with Differential/Platelet     Status: Abnormal   Collection Time: 02/20/14  9:45 PM  Result Value Ref Range   WBC 8.8 4.0 - 10.5 K/uL   RBC 4.34 3.87 - 5.11 MIL/uL   Hemoglobin 10.7 (L) 12.0 - 15.0 g/dL   HCT 33.4 (L) 36.0 - 46.0 %   MCV 77.0 (L) 78.0 - 100.0 fL   MCH 24.7 (L) 26.0 - 34.0 pg   MCHC 32.0 30.0 - 36.0 g/dL   RDW 16.6 (H) 11.5 - 15.5 %   Platelets 368 150 - 400 K/uL   Neutrophils Relative % 67 43 - 77 %   Neutro Abs 6.0 1.7 -  7.7 K/uL   Lymphocytes Relative 21 12 - 46 %   Lymphs Abs 1.8 0.7 - 4.0 K/uL   Monocytes Relative 9 3 - 12 %   Monocytes Absolute 0.8 0.1 - 1.0 K/uL   Eosinophils Relative 2 0 - 5 %   Eosinophils Absolute 0.2 0.0 - 0.7 K/uL   Basophils Relative 1 0 - 1 %   Basophils Absolute 0.0 0.0 - 0.1 K/uL  Comprehensive metabolic panel     Status: Abnormal   Collection Time: 02/20/14  9:45 PM  Result Value Ref Range   Sodium 138 135 - 145 mmol/L   Potassium 4.5 3.5 - 5.1 mmol/L   Chloride 105 96 - 112 mmol/L   CO2 29 19 - 32 mmol/L   Glucose, Bld 100 (H) 70 - 99 mg/dL   BUN 9 6 - 23 mg/dL   Creatinine, Ser 0.91 0.50 - 1.10 mg/dL   Calcium 9.1 8.4 - 10.5 mg/dL   Total Protein 7.6 6.0 - 8.3 g/dL   Albumin 3.9 3.5 - 5.2 g/dL   AST 22 0 - 37 U/L   ALT 32 0 - 35 U/L   Alkaline Phosphatase 78 39 - 117 U/L   Total Bilirubin 0.5 0.3 - 1.2 mg/dL   GFR calc non Af Amer 78 (L) >90 mL/min   GFR calc Af Amer >90 >90 mL/min    Comment: (NOTE) The eGFR has been calculated using the CKD EPI equation. This calculation has not been validated in all clinical situations. eGFR's persistently <90 mL/min signify possible Chronic Kidney Disease.    Anion gap 4 (L) 5 - 15  Urinalysis, Routine w reflex microscopic     Status: None   Collection Time: 02/22/14  2:20 PM  Result Value Ref Range   Color, Urine YELLOW YELLOW   APPearance  CLEAR CLEAR   Specific Gravity, Urine 1.010 1.005 - 1.030   pH 7.0 5.0 - 8.0   Glucose, UA NEGATIVE NEGATIVE mg/dL   Hgb urine dipstick NEGATIVE NEGATIVE   Bilirubin Urine NEGATIVE NEGATIVE   Ketones, ur NEGATIVE NEGATIVE mg/dL   Protein, ur NEGATIVE NEGATIVE mg/dL   Urobilinogen, UA 0.2 0.0 - 1.0 mg/dL   Nitrite NEGATIVE NEGATIVE   Leukocytes, UA NEGATIVE NEGATIVE    Comment: MICROSCOPIC NOT DONE ON URINES WITH NEGATIVE PROTEIN, BLOOD, LEUKOCYTES, NITRITE, OR GLUCOSE <1000 mg/dL.  CBC with Differential/Platelet     Status: Abnormal   Collection Time: 02/22/14  6:15 PM  Result Value Ref Range   WBC 8.6 4.0 - 10.5 K/uL   RBC 4.27 3.87 - 5.11 MIL/uL   Hemoglobin 10.5 (L) 12.0 - 15.0 g/dL   HCT 32.6 (L) 36.0 - 46.0 %   MCV 76.3 (L) 78.0 - 100.0 fL   MCH 24.6 (L) 26.0 - 34.0 pg   MCHC 32.2 30.0 - 36.0 g/dL   RDW 16.4 (H) 11.5 - 15.5 %   Platelets 356 150 - 400 K/uL   Neutrophils Relative % 61 43 - 77 %   Neutro Abs 5.3 1.7 - 7.7 K/uL   Lymphocytes Relative 28 12 - 46 %   Lymphs Abs 2.4 0.7 - 4.0 K/uL   Monocytes Relative 8 3 - 12 %   Monocytes Absolute 0.7 0.1 - 1.0 K/uL   Eosinophils Relative 2 0 - 5 %   Eosinophils Absolute 0.2 0.0 - 0.7 K/uL   Basophils Relative 1 0 - 1 %   Basophils Absolute 0.0 0.0 - 0.1 K/uL  CBC  Status: Abnormal   Collection Time: 03/12/14  1:05 AM  Result Value Ref Range   WBC 7.4 4.0 - 10.5 K/uL   RBC 4.51 3.87 - 5.11 MIL/uL   Hemoglobin 10.9 (L) 12.0 - 15.0 g/dL   HCT 34.7 (L) 36.0 - 46.0 %   MCV 76.9 (L) 78.0 - 100.0 fL   MCH 24.2 (L) 26.0 - 34.0 pg   MCHC 31.4 30.0 - 36.0 g/dL   RDW 16.1 (H) 11.5 - 15.5 %   Platelets 357 150 - 400 K/uL  Basic metabolic panel     Status: Abnormal   Collection Time: 03/12/14  1:05 AM  Result Value Ref Range   Sodium 138 135 - 145 mmol/L   Potassium 3.4 (L) 3.5 - 5.1 mmol/L   Chloride 104 96 - 112 mmol/L   CO2 29 19 - 32 mmol/L   Glucose, Bld 96 70 - 99 mg/dL   BUN 7 6 - 23 mg/dL   Creatinine, Ser 0.87  0.50 - 1.10 mg/dL   Calcium 8.8 8.4 - 10.5 mg/dL   GFR calc non Af Amer 83 (L) >90 mL/min   GFR calc Af Amer >90 >90 mL/min    Comment: (NOTE) The eGFR has been calculated using the CKD EPI equation. This calculation has not been validated in all clinical situations. eGFR's persistently <90 mL/min signify possible Chronic Kidney Disease.    Anion gap 5 5 - 15  BNP (order ONLY if patient complains of dyspnea/SOB AND you have documented it for THIS visit)     Status: None   Collection Time: 03/12/14  1:05 AM  Result Value Ref Range   B Natriuretic Peptide 13.9 0.0 - 100.0 pg/mL  I-stat troponin, ED (not at [facility])     Status: None   Collection Time: 03/12/14  1:23 AM  Result Value Ref Range   Troponin i, poc 0.00 0.00 - 0.08 ng/mL   Comment 3            Comment: Due to the release kinetics of cTnI, a negative result within the first hours of the onset of symptoms does not rule out myocardial infarction with certainty. If myocardial infarction is still suspected, repeat the test at appropriate intervals.   Comprehensive metabolic panel     Status: Abnormal   Collection Time: 03/12/14  3:25 AM  Result Value Ref Range   Sodium 137 135 - 145 mmol/L   Potassium 3.4 (L) 3.5 - 5.1 mmol/L   Chloride 104 96 - 112 mmol/L   CO2 29 19 - 32 mmol/L   Glucose, Bld 92 70 - 99 mg/dL   BUN 7 6 - 23 mg/dL   Creatinine, Ser 0.89 0.50 - 1.10 mg/dL   Calcium 8.9 8.4 - 10.5 mg/dL   Total Protein 6.8 6.0 - 8.3 g/dL   Albumin 3.5 3.5 - 5.2 g/dL   AST 16 0 - 37 U/L   ALT 16 0 - 35 U/L   Alkaline Phosphatase 71 39 - 117 U/L   Total Bilirubin 0.4 0.3 - 1.2 mg/dL   GFR calc non Af Amer 81 (L) >90 mL/min   GFR calc Af Amer >90 >90 mL/min    Comment: (NOTE) The eGFR has been calculated using the CKD EPI equation. This calculation has not been validated in all clinical situations. eGFR's persistently <90 mL/min signify possible Chronic Kidney Disease.    Anion gap 4 (L) 5 - 15  Lipase, blood      Status: None  Collection Time: 03/12/14  3:25 AM  Result Value Ref Range   Lipase 22 11 - 59 U/L  D-dimer, quantitative     Status: Abnormal   Collection Time: 03/12/14  3:25 AM  Result Value Ref Range   D-Dimer, Quant 1.61 (H) 0.00 - 0.48 ug/mL-FEU    Comment:        AT THE INHOUSE ESTABLISHED CUTOFF VALUE OF 0.48 ug/mL FEU, THIS ASSAY HAS BEEN DOCUMENTED IN THE LITERATURE TO HAVE A SENSITIVITY AND NEGATIVE PREDICTIVE VALUE OF AT LEAST 98 TO 99%.  THE TEST RESULT SHOULD BE CORRELATED WITH AN ASSESSMENT OF THE CLINICAL PROBABILITY OF DVT / VTE.   Urinalysis, Routine w reflex microscopic     Status: Abnormal   Collection Time: 03/12/14  5:58 AM  Result Value Ref Range   Color, Urine YELLOW YELLOW   APPearance CLEAR CLEAR   Specific Gravity, Urine <1.005 (L) 1.005 - 1.030   pH 7.0 5.0 - 8.0   Glucose, UA NEGATIVE NEGATIVE mg/dL   Hgb urine dipstick NEGATIVE NEGATIVE   Bilirubin Urine NEGATIVE NEGATIVE   Ketones, ur NEGATIVE NEGATIVE mg/dL   Protein, ur NEGATIVE NEGATIVE mg/dL   Urobilinogen, UA 0.2 0.0 - 1.0 mg/dL   Nitrite NEGATIVE NEGATIVE   Leukocytes, UA NEGATIVE NEGATIVE    Comment: MICROSCOPIC NOT DONE ON URINES WITH NEGATIVE PROTEIN, BLOOD, LEUKOCYTES, NITRITE, OR GLUCOSE <1000 mg/dL.  CBC     Status: Abnormal   Collection Time: 03/18/14 11:48 AM  Result Value Ref Range   WBC 6.5 4.0 - 10.5 K/uL   RBC 4.57 3.87 - 5.11 MIL/uL   Hemoglobin 11.1 (L) 12.0 - 15.0 g/dL   HCT 34.9 (L) 36.0 - 46.0 %   MCV 76.4 (L) 78.0 - 100.0 fL   MCH 24.3 (L) 26.0 - 34.0 pg   MCHC 31.8 30.0 - 36.0 g/dL   RDW 16.2 (H) 11.5 - 15.5 %   Platelets 364 150 - 400 K/uL  Basic metabolic panel     Status: Abnormal   Collection Time: 03/18/14 11:48 AM  Result Value Ref Range   Sodium 138 135 - 145 mmol/L   Potassium 3.8 3.5 - 5.1 mmol/L   Chloride 102 96 - 112 mmol/L   CO2 30 19 - 32 mmol/L   Glucose, Bld 90 70 - 99 mg/dL   BUN 8 6 - 23 mg/dL   Creatinine, Ser 0.95 0.50 - 1.10 mg/dL    Calcium 9.2 8.4 - 10.5 mg/dL   GFR calc non Af Amer 74 (L) >90 mL/min   GFR calc Af Amer 86 (L) >90 mL/min    Comment: (NOTE) The eGFR has been calculated using the CKD EPI equation. This calculation has not been validated in all clinical situations. eGFR's persistently <90 mL/min signify possible Chronic Kidney Disease.    Anion gap 6 5 - 15  BNP (order ONLY if patient complains of dyspnea/SOB AND you have documented it for THIS visit)     Status: None   Collection Time: 03/18/14 11:48 AM  Result Value Ref Range   B Natriuretic Peptide 15.2 0.0 - 100.0 pg/mL  I-stat troponin, ED (not at [facility])     Status: None   Collection Time: 03/18/14 12:20 PM  Result Value Ref Range   Troponin i, poc 0.00 0.00 - 0.08 ng/mL   Comment 3            Comment: Due to the release kinetics of cTnI, a negative result within the first hours of  the onset of symptoms does not rule out myocardial infarction with certainty. If myocardial infarction is still suspected, repeat the test at appropriate intervals.   I-stat troponin, ED     Status: None   Collection Time: 03/18/14  4:02 PM  Result Value Ref Range   Troponin i, poc 0.00 0.00 - 0.08 ng/mL   Comment 3            Comment: Due to the release kinetics of cTnI, a negative result within the first hours of the onset of symptoms does not rule out myocardial infarction with certainty. If myocardial infarction is still suspected, repeat the test at appropriate intervals.   Urinalysis, Routine w reflex microscopic     Status: Abnormal   Collection Time: 05/10/14  6:44 PM  Result Value Ref Range   Color, Urine YELLOW YELLOW   APPearance CLOUDY (A) CLEAR   Specific Gravity, Urine 1.019 1.005 - 1.030   pH 8.0 5.0 - 8.0   Glucose, UA NEGATIVE NEGATIVE mg/dL   Hgb urine dipstick NEGATIVE NEGATIVE   Bilirubin Urine NEGATIVE NEGATIVE   Ketones, ur NEGATIVE NEGATIVE mg/dL   Protein, ur NEGATIVE NEGATIVE mg/dL   Urobilinogen, UA 0.2 0.0 - 1.0 mg/dL    Nitrite NEGATIVE NEGATIVE   Leukocytes, UA NEGATIVE NEGATIVE    Comment: MICROSCOPIC NOT DONE ON URINES WITH NEGATIVE PROTEIN, BLOOD, LEUKOCYTES, NITRITE, OR GLUCOSE <1000 mg/dL.  CBC with Differential     Status: Abnormal   Collection Time: 05/10/14  6:51 PM  Result Value Ref Range   WBC 8.7 4.0 - 10.5 K/uL   RBC 4.61 3.87 - 5.11 MIL/uL   Hemoglobin 11.1 (L) 12.0 - 15.0 g/dL   HCT 34.6 (L) 36.0 - 46.0 %   MCV 75.1 (L) 78.0 - 100.0 fL   MCH 24.1 (L) 26.0 - 34.0 pg   MCHC 32.1 30.0 - 36.0 g/dL   RDW 16.6 (H) 11.5 - 15.5 %   Platelets 369 150 - 400 K/uL   Neutrophils Relative % 62 43 - 77 %   Neutro Abs 5.4 1.7 - 7.7 K/uL   Lymphocytes Relative 25 12 - 46 %   Lymphs Abs 2.1 0.7 - 4.0 K/uL   Monocytes Relative 11 3 - 12 %   Monocytes Absolute 1.0 0.1 - 1.0 K/uL   Eosinophils Relative 1 0 - 5 %   Eosinophils Absolute 0.1 0.0 - 0.7 K/uL   Basophils Relative 1 0 - 1 %   Basophils Absolute 0.1 0.0 - 0.1 K/uL  Comprehensive metabolic panel     Status: Abnormal   Collection Time: 05/10/14  6:51 PM  Result Value Ref Range   Sodium 138 135 - 145 mmol/L   Potassium 3.9 3.5 - 5.1 mmol/L   Chloride 104 96 - 112 mmol/L   CO2 25 19 - 32 mmol/L   Glucose, Bld 89 70 - 99 mg/dL   BUN 6 6 - 23 mg/dL   Creatinine, Ser 0.91 0.50 - 1.10 mg/dL   Calcium 8.8 8.4 - 10.5 mg/dL   Total Protein 7.1 6.0 - 8.3 g/dL   Albumin 3.7 3.5 - 5.2 g/dL   AST 18 0 - 37 U/L   ALT 15 0 - 35 U/L   Alkaline Phosphatase 66 39 - 117 U/L   Total Bilirubin 0.4 0.3 - 1.2 mg/dL   GFR calc non Af Amer 78 (L) >90 mL/min   GFR calc Af Amer >90 >90 mL/min    Comment: (NOTE) The eGFR has  been calculated using the CKD EPI equation. This calculation has not been validated in all clinical situations. eGFR's persistently <90 mL/min signify possible Chronic Kidney Disease.    Anion gap 9 5 - 15  Lipase, blood     Status: None   Collection Time: 05/10/14  6:51 PM  Result Value Ref Range   Lipase 27 11 - 59 U/L  I-Stat  Troponin, ED (not at [facility])     Status: None   Collection Time: 05/10/14  8:28 PM  Result Value Ref Range   Troponin i, poc 0.00 0.00 - 0.08 ng/mL   Comment 3            Comment: Due to the release kinetics of cTnI, a negative result within the first hours of the onset of symptoms does not rule out myocardial infarction with certainty. If myocardial infarction is still suspected, repeat the test at appropriate intervals.        Musculoskeletal: Strength & Muscle Tone: within normal limits Gait & Station: normal Patient leans: N/A  Mental Status Examination;   Psychiatric Specialty Exam: Physical Exam  Constitutional: She appears well-developed and well-nourished. No distress.  HENT:  Head: Normocephalic and atraumatic.  Skin: She is not diaphoretic.    Review of Systems  Cardiovascular: Negative for chest pain.  Skin: Negative for rash.  Psychiatric/Behavioral: Negative for suicidal ideas and substance abuse. The patient is nervous/anxious.     Pulse 80, height _0  (1.702 m), weight 228 lb (103.42 kg), last menstrual period 10/24/2013.Body mass index is 35.7 kg/(m^2).  General Appearance: Casual  Eye Contact::  Fair  Speech:  Normal Rate  Volume:  Normal  Mood:  Dysphoric and anxious  Affect:  Congruent  Thought Process:  Coherent  Orientation:  Full (Time, Place, and Person)  Thought Content:  Rumination  Suicidal Thoughts:  No  Homicidal Thoughts:  No  Memory:  Immediate;   Fair Recent;   Fair  Judgement:  Fair  Insight:  Fair  Psychomotor Activity:  Normal  Concentration:  Fair  Recall:  Fair  Akathisia:  Negative  Handed:  Right  AIMS (if indicated):     Assets:  Communication Skills Desire for Improvement Financial Resources/Insurance Housing Intimacy  Sleep:        Assessment: Axis I: Major depressive disorder recurrent moderate. Generalized anxiety disorder. Panic disorder. Acute stress disorder  Axis II: Deferred  Axis III:  Past  Medical History  Diagnosis Date  . Anxiety   . Depression   . Umbilical hernia   . Anemia     Axis IV: Psychosocial recently moved from [place]. Job stress. Recent hysterectomy   Treatment Plan and Summary: Patient advised  Again not to increase the Wellbutrin from 300 mg in the further as it may cause anxiety. Will increase zoloft 125m for anxiety and depression. Can take regular klonopine 0,589mqd for anxiety   Patient requesting and would need FMLA again. Also looking for a different work environment.    I'll see her back for follow up in 3 weeks. Pertinent Labs and Relevant Prior Notes reviewed. Medication Side effects, benefits and risks reviewed/discussed with Patient. Time given for patient to respond and asks questions regarding the Diagnosis and Medications. Safety concerns and to report to ER if suicidal or call 911. Relevant Medications refilled or called in to pharmacy. Discussed weight maintenance and Sleep Hygiene. Follow up with Primary care provider in regards to Medical conditions. Recommend compliance with medications and follow up office appointments.  Discussed to avail opportunity to consider or/and continue Individual therapy with Counselor. Greater than 50% of time was spend in counseling and coordination of care with the patient.  Schedule for Follow up visit in 3 weeks or call in earlier as necessary.  Time spent ; 25 minutes  Merian Capron, MD 05/13/2014

## 2014-05-28 ENCOUNTER — Ambulatory Visit (HOSPITAL_COMMUNITY): Payer: Self-pay | Admitting: Psychiatry

## 2014-06-01 ENCOUNTER — Ambulatory Visit (INDEPENDENT_AMBULATORY_CARE_PROVIDER_SITE_OTHER): Payer: Federal, State, Local not specified - PPO | Admitting: Psychiatry

## 2014-06-01 ENCOUNTER — Encounter (HOSPITAL_COMMUNITY): Payer: Self-pay | Admitting: Psychiatry

## 2014-06-01 VITALS — BP 122/64 | HR 79 | Ht 67.0 in | Wt 230.0 lb

## 2014-06-01 DIAGNOSIS — F4311 Post-traumatic stress disorder, acute: Secondary | ICD-10-CM

## 2014-06-01 DIAGNOSIS — F430 Acute stress reaction: Secondary | ICD-10-CM | POA: Diagnosis not present

## 2014-06-01 DIAGNOSIS — F410 Panic disorder [episodic paroxysmal anxiety] without agoraphobia: Secondary | ICD-10-CM

## 2014-06-01 DIAGNOSIS — F331 Major depressive disorder, recurrent, moderate: Secondary | ICD-10-CM | POA: Diagnosis not present

## 2014-06-01 DIAGNOSIS — F411 Generalized anxiety disorder: Secondary | ICD-10-CM | POA: Diagnosis not present

## 2014-06-01 LAB — HM PAP SMEAR

## 2014-06-01 MED ORDER — BUPROPION HCL ER (XL) 150 MG PO TB24: 150.0000 mg | ORAL_TABLET | ORAL | Status: AC

## 2014-06-01 MED ORDER — SERTRALINE HCL 100 MG PO TABS: 150.0000 mg | ORAL_TABLET | Freq: Every day | ORAL | Status: DC

## 2014-06-01 NOTE — Progress Notes (Signed)
Patient ID: Tiffany [name], female   DOB: Jun 07, 1975, 39 y.o.   MRN: [id number]  [facility] Follow up visit  Karon Cotterill [id number] 39 y.o.  06/01/2014 1:37 PM  Chief Complaint:  Depression, anxiety stress related to work.   History of Present Illness:   Patient Presents for follow up and medication management for Derpession and anxiety.  Acute stress disorder  Patient is 39 years old currently married African-American female. She is living with her husband and 4 kids. They have recently moved from [place] she works with [employer] for the last 20 years. Initially presented with the following "Says that she has been on different medications starting couple of years ago when she was having stress related to work she started having panic attacks when her manager giving her a hard time. She also had gone to ED and rule out any cardiac problems and heart back since she was having chest pain palpitations as if she is going to choke or die. After that she was diagnosed with generalized anxiety disorder and follows up with her primary care physician. She did change her job despite that she still has had anxiety. Says that Wellbutrin helped her depression but it did not help her anxiety. She's been on different medications in the past including Paxil that made her body warm. Lexapro made her sleepy Zoloft made her cry."  Stress disorder ongoing related to apprehension of how she will handle work if she has to start . As of now she is on FMLA.  Continue zoloft which was increased last visit. Depression/anxiety' more so related to work stress. Ongoing but not worsened.  She also takes klonopine prn but not regularly   Worry and anxiety related to going back to work and panic apprehension is her main stressor.  Severity: 4/10 .  10 being no depression. Anxiety ; 6 to 8/10 . 10 being extreme anxiety Context: job stress, worried about her job Modifying factors: supportive  husband and family. Does not endorse hopelessness suicidal or homicidal thoughts. Does not endorse delusions or hallucinations, there is no psychotic symptoms. There is no current manic symptoms over the past does not endorse any paranoia.        Past Psychiatric History/Hospitalization(s) Denies. Mostly followed with primary care. Have no psych admissions or Suicide attempts.   Hospitalization for psychiatric illness: No History of Electroconvulsive Shock Therapy: No Prior Suicide Attempts: No  Medical History; Past Medical History  Diagnosis Date  . Anxiety   . Depression   . Umbilical hernia   . Anemia     Allergies: Allergies  Allergen Reactions  . Mango Flavor Anaphylaxis and Swelling  . Penicillins Itching and Swelling    Medications: Outpatient Encounter Prescriptions as of 06/01/2014  Medication Sig  . acetaminophen (TYLENOL) 325 MG tablet Take 650 mg by mouth every 6 (six) hours as needed for mild pain.  Marland Kitchen buPROPion (WELLBUTRIN XL) 150 MG 24 hr tablet Take 1 tablet (150 mg total) by mouth every morning.  . clobetasol ointment (TEMOVATE) 4.17 % Apply 1 application topically 2 (two) times daily.   . clonazePAM (KLONOPIN) 0.5 MG tablet Take 1 tablet (0.5 mg total) by mouth daily as needed for anxiety.  . ferrous sulfate 325 (65 FE) MG tablet Take 325 mg by mouth 2 (two) times daily with a meal.  . Multiple Vitamin (MULTIVITAMIN) capsule Take 1 capsule by mouth daily.  . sertraline (ZOLOFT) 100 MG tablet Take 1.5 tablets (150 mg total) by  mouth daily.  . [DISCONTINUED] buPROPion (WELLBUTRIN XL) 150 MG 24 hr tablet Take 1 tablet (150 mg total) by mouth every morning.  . [DISCONTINUED] sertraline (ZOLOFT) 100 MG tablet Take 1.5 tablets (150 mg total) by mouth daily.  Marland Kitchen ibuprofen (ADVIL,MOTRIN) 800 MG tablet Take 1 tablet (800 mg total) by mouth every 8 (eight) hours as needed. (Patient not taking: Reported on 06/01/2014)   No facility-administered encounter medications  on file as of 06/01/2014.     Substance Abuse History: Denies. States to be non smoker, no alcohol or using drugs.   Family History; Family History  Problem Relation Age of Onset  . Heart disease Mother 53    heart attack  . Depression Mother   . Hypertension Father 20      Labs:  Recent Results (from the past 2160 hour(s))  CBC     Status: Abnormal   Collection Time: 03/12/14  1:05 AM  Result Value Ref Range   WBC 7.4 4.0 - 10.5 K/uL   RBC 4.51 3.87 - 5.11 MIL/uL   Hemoglobin 10.9 (L) 12.0 - 15.0 g/dL   HCT 34.7 (L) 36.0 - 46.0 %   MCV 76.9 (L) 78.0 - 100.0 fL   MCH 24.2 (L) 26.0 - 34.0 pg   MCHC 31.4 30.0 - 36.0 g/dL   RDW 16.1 (H) 11.5 - 15.5 %   Platelets 357 150 - 400 K/uL  Basic metabolic panel     Status: Abnormal   Collection Time: 03/12/14  1:05 AM  Result Value Ref Range   Sodium 138 135 - 145 mmol/L   Potassium 3.4 (L) 3.5 - 5.1 mmol/L   Chloride 104 96 - 112 mmol/L   CO2 29 19 - 32 mmol/L   Glucose, Bld 96 70 - 99 mg/dL   BUN 7 6 - 23 mg/dL   Creatinine, Ser 0.87 0.50 - 1.10 mg/dL   Calcium 8.8 8.4 - 10.5 mg/dL   GFR calc non Af Amer 83 (L) >90 mL/min   GFR calc Af Amer >90 >90 mL/min    Comment: (NOTE) The eGFR has been calculated using the CKD EPI equation. This calculation has not been validated in all clinical situations. eGFR's persistently <90 mL/min signify possible Chronic Kidney Disease.    Anion gap 5 5 - 15  BNP (order ONLY if patient complains of dyspnea/SOB AND you have documented it for THIS visit)     Status: None   Collection Time: 03/12/14  1:05 AM  Result Value Ref Range   B Natriuretic Peptide 13.9 0.0 - 100.0 pg/mL  I-stat troponin, ED (not at [facility])     Status: None   Collection Time: 03/12/14  1:23 AM  Result Value Ref Range   Troponin i, poc 0.00 0.00 - 0.08 ng/mL   Comment 3            Comment: Due to the release kinetics of cTnI, a negative result within the first hours of the onset of symptoms does not rule  out myocardial infarction with certainty. If myocardial infarction is still suspected, repeat the test at appropriate intervals.   Comprehensive metabolic panel     Status: Abnormal   Collection Time: 03/12/14  3:25 AM  Result Value Ref Range   Sodium 137 135 - 145 mmol/L   Potassium 3.4 (L) 3.5 - 5.1 mmol/L   Chloride 104 96 - 112 mmol/L   CO2 29 19 - 32 mmol/L   Glucose, Bld 92 70 - 99 mg/dL  BUN 7 6 - 23 mg/dL   Creatinine, Ser 0.89 0.50 - 1.10 mg/dL   Calcium 8.9 8.4 - 10.5 mg/dL   Total Protein 6.8 6.0 - 8.3 g/dL   Albumin 3.5 3.5 - 5.2 g/dL   AST 16 0 - 37 U/L   ALT 16 0 - 35 U/L   Alkaline Phosphatase 71 39 - 117 U/L   Total Bilirubin 0.4 0.3 - 1.2 mg/dL   GFR calc non Af Amer 81 (L) >90 mL/min   GFR calc Af Amer >90 >90 mL/min    Comment: (NOTE) The eGFR has been calculated using the CKD EPI equation. This calculation has not been validated in all clinical situations. eGFR's persistently <90 mL/min signify possible Chronic Kidney Disease.    Anion gap 4 (L) 5 - 15  Lipase, blood     Status: None   Collection Time: 03/12/14  3:25 AM  Result Value Ref Range   Lipase 22 11 - 59 U/L  D-dimer, quantitative     Status: Abnormal   Collection Time: 03/12/14  3:25 AM  Result Value Ref Range   D-Dimer, Quant 1.61 (H) 0.00 - 0.48 ug/mL-FEU    Comment:        AT THE INHOUSE ESTABLISHED CUTOFF VALUE OF 0.48 ug/mL FEU, THIS ASSAY HAS BEEN DOCUMENTED IN THE LITERATURE TO HAVE A SENSITIVITY AND NEGATIVE PREDICTIVE VALUE OF AT LEAST 98 TO 99%.  THE TEST RESULT SHOULD BE CORRELATED WITH AN ASSESSMENT OF THE CLINICAL PROBABILITY OF DVT / VTE.   Urinalysis, Routine w reflex microscopic     Status: Abnormal   Collection Time: 03/12/14  5:58 AM  Result Value Ref Range   Color, Urine YELLOW YELLOW   APPearance CLEAR CLEAR   Specific Gravity, Urine <1.005 (L) 1.005 - 1.030   pH 7.0 5.0 - 8.0   Glucose, UA NEGATIVE NEGATIVE mg/dL   Hgb urine dipstick NEGATIVE NEGATIVE    Bilirubin Urine NEGATIVE NEGATIVE   Ketones, ur NEGATIVE NEGATIVE mg/dL   Protein, ur NEGATIVE NEGATIVE mg/dL   Urobilinogen, UA 0.2 0.0 - 1.0 mg/dL   Nitrite NEGATIVE NEGATIVE   Leukocytes, UA NEGATIVE NEGATIVE    Comment: MICROSCOPIC NOT DONE ON URINES WITH NEGATIVE PROTEIN, BLOOD, LEUKOCYTES, NITRITE, OR GLUCOSE <1000 mg/dL.  CBC     Status: Abnormal   Collection Time: 03/18/14 11:48 AM  Result Value Ref Range   WBC 6.5 4.0 - 10.5 K/uL   RBC 4.57 3.87 - 5.11 MIL/uL   Hemoglobin 11.1 (L) 12.0 - 15.0 g/dL   HCT 34.9 (L) 36.0 - 46.0 %   MCV 76.4 (L) 78.0 - 100.0 fL   MCH 24.3 (L) 26.0 - 34.0 pg   MCHC 31.8 30.0 - 36.0 g/dL   RDW 16.2 (H) 11.5 - 15.5 %   Platelets 364 150 - 400 K/uL  Basic metabolic panel     Status: Abnormal   Collection Time: 03/18/14 11:48 AM  Result Value Ref Range   Sodium 138 135 - 145 mmol/L   Potassium 3.8 3.5 - 5.1 mmol/L   Chloride 102 96 - 112 mmol/L   CO2 30 19 - 32 mmol/L   Glucose, Bld 90 70 - 99 mg/dL   BUN 8 6 - 23 mg/dL   Creatinine, Ser 0.95 0.50 - 1.10 mg/dL   Calcium 9.2 8.4 - 10.5 mg/dL   GFR calc non Af Amer 74 (L) >90 mL/min   GFR calc Af Amer 86 (L) >90 mL/min    Comment: (NOTE)  The eGFR has been calculated using the CKD EPI equation. This calculation has not been validated in all clinical situations. eGFR's persistently <90 mL/min signify possible Chronic Kidney Disease.    Anion gap 6 5 - 15  BNP (order ONLY if patient complains of dyspnea/SOB AND you have documented it for THIS visit)     Status: None   Collection Time: 03/18/14 11:48 AM  Result Value Ref Range   B Natriuretic Peptide 15.2 0.0 - 100.0 pg/mL  I-stat troponin, ED (not at [facility])     Status: None   Collection Time: 03/18/14 12:20 PM  Result Value Ref Range   Troponin i, poc 0.00 0.00 - 0.08 ng/mL   Comment 3            Comment: Due to the release kinetics of cTnI, a negative result within the first hours of the onset of symptoms does not rule out myocardial  infarction with certainty. If myocardial infarction is still suspected, repeat the test at appropriate intervals.   I-stat troponin, ED     Status: None   Collection Time: 03/18/14  4:02 PM  Result Value Ref Range   Troponin i, poc 0.00 0.00 - 0.08 ng/mL   Comment 3            Comment: Due to the release kinetics of cTnI, a negative result within the first hours of the onset of symptoms does not rule out myocardial infarction with certainty. If myocardial infarction is still suspected, repeat the test at appropriate intervals.   Urinalysis, Routine w reflex microscopic     Status: Abnormal   Collection Time: 05/10/14  6:44 PM  Result Value Ref Range   Color, Urine YELLOW YELLOW   APPearance CLOUDY (A) CLEAR   Specific Gravity, Urine 1.019 1.005 - 1.030   pH 8.0 5.0 - 8.0   Glucose, UA NEGATIVE NEGATIVE mg/dL   Hgb urine dipstick NEGATIVE NEGATIVE   Bilirubin Urine NEGATIVE NEGATIVE   Ketones, ur NEGATIVE NEGATIVE mg/dL   Protein, ur NEGATIVE NEGATIVE mg/dL   Urobilinogen, UA 0.2 0.0 - 1.0 mg/dL   Nitrite NEGATIVE NEGATIVE   Leukocytes, UA NEGATIVE NEGATIVE    Comment: MICROSCOPIC NOT DONE ON URINES WITH NEGATIVE PROTEIN, BLOOD, LEUKOCYTES, NITRITE, OR GLUCOSE <1000 mg/dL.  CBC with Differential     Status: Abnormal   Collection Time: 05/10/14  6:51 PM  Result Value Ref Range   WBC 8.7 4.0 - 10.5 K/uL   RBC 4.61 3.87 - 5.11 MIL/uL   Hemoglobin 11.1 (L) 12.0 - 15.0 g/dL   HCT 34.6 (L) 36.0 - 46.0 %   MCV 75.1 (L) 78.0 - 100.0 fL   MCH 24.1 (L) 26.0 - 34.0 pg   MCHC 32.1 30.0 - 36.0 g/dL   RDW 16.6 (H) 11.5 - 15.5 %   Platelets 369 150 - 400 K/uL   Neutrophils Relative % 62 43 - 77 %   Neutro Abs 5.4 1.7 - 7.7 K/uL   Lymphocytes Relative 25 12 - 46 %   Lymphs Abs 2.1 0.7 - 4.0 K/uL   Monocytes Relative 11 3 - 12 %   Monocytes Absolute 1.0 0.1 - 1.0 K/uL   Eosinophils Relative 1 0 - 5 %   Eosinophils Absolute 0.1 0.0 - 0.7 K/uL   Basophils Relative 1 0 - 1 %    Basophils Absolute 0.1 0.0 - 0.1 K/uL  Comprehensive metabolic panel     Status: Abnormal   Collection Time: 05/10/14  6:51 PM  Result Value Ref Range   Sodium 138 135 - 145 mmol/L   Potassium 3.9 3.5 - 5.1 mmol/L   Chloride 104 96 - 112 mmol/L   CO2 25 19 - 32 mmol/L   Glucose, Bld 89 70 - 99 mg/dL   BUN 6 6 - 23 mg/dL   Creatinine, Ser 0.91 0.50 - 1.10 mg/dL   Calcium 8.8 8.4 - 10.5 mg/dL   Total Protein 7.1 6.0 - 8.3 g/dL   Albumin 3.7 3.5 - 5.2 g/dL   AST 18 0 - 37 U/L   ALT 15 0 - 35 U/L   Alkaline Phosphatase 66 39 - 117 U/L   Total Bilirubin 0.4 0.3 - 1.2 mg/dL   GFR calc non Af Amer 78 (L) >90 mL/min   GFR calc Af Amer >90 >90 mL/min    Comment: (NOTE) The eGFR has been calculated using the CKD EPI equation. This calculation has not been validated in all clinical situations. eGFR's persistently <90 mL/min signify possible Chronic Kidney Disease.    Anion gap 9 5 - 15  Lipase, blood     Status: None   Collection Time: 05/10/14  6:51 PM  Result Value Ref Range   Lipase 27 11 - 59 U/L  I-Stat Troponin, ED (not at [facility])     Status: None   Collection Time: 05/10/14  8:28 PM  Result Value Ref Range   Troponin i, poc 0.00 0.00 - 0.08 ng/mL   Comment 3            Comment: Due to the release kinetics of cTnI, a negative result within the first hours of the onset of symptoms does not rule out myocardial infarction with certainty. If myocardial infarction is still suspected, repeat the test at appropriate intervals.        Musculoskeletal: Strength & Muscle Tone: within normal limits Gait & Station: normal Patient leans: N/A  Mental Status Examination;   Psychiatric Specialty Exam: Physical Exam  Constitutional: She appears well-developed and well-nourished. No distress.  HENT:  Head: Normocephalic and atraumatic.  Skin: She is not diaphoretic.    Review of Systems  Constitutional: Negative.   Neurological: Negative for headaches.  Psychiatric/Behavioral:  Negative for suicidal ideas and substance abuse. The patient is nervous/anxious.     Blood pressure 122/64, pulse 79, height $RemoveBe'5\' 7"'[id number]$  (1.702 m), weight 230 lb (104.327 kg), last menstrual period 10/24/2013, SpO2 98 %.Body mass index is 36.01 kg/(m^2).  General Appearance: Casual  Eye Contact::  Fair  Speech:  Normal Rate  Volume:  Normal  Mood:  Dysphoric and somewhat anxious  Affect:  Congruent  Thought Process:  Coherent  Orientation:  Full (Time, Place, and Person)  Thought Content:  Rumination  Suicidal Thoughts:  No  Homicidal Thoughts:  No  Memory:  Immediate;   Fair Recent;   Fair  Judgement:  Fair  Insight:  Fair  Psychomotor Activity:  Normal  Concentration:  Fair  Recall:  Fair  Akathisia:  Negative  Handed:  Right  AIMS (if indicated):     Assets:  Communication Skills Desire for Improvement Financial Resources/Insurance Housing Intimacy  Sleep:        Assessment: Axis I: Major depressive disorder recurrent moderate. Generalized anxiety disorder. Panic disorder. Acute stress disorder  Axis II: Deferred  Axis III:  Past Medical History  Diagnosis Date  . Anxiety   . Depression   . Umbilical hernia   . Anemia     Axis IV: Psychosocial recently moved  from [place]. Job stress. Recent hysterectomy   Treatment Plan and Summary: Continue Zoloft 150 mg. Continue Wellbutrin. Refills given Can take regular klonopine 0,48m qd for anxiety. Has refill   Patient on FMLA . Also looking for a different work environment.  Continue therapy in regard to stress and how to cope with related to work if she starts.  Increase daily acitivities  Medication Side effects, benefits and risks reviewed/discussed with Patient. Time given for patient to respond and asks questions regarding the Diagnosis and Medications. Safety concerns and to report to ER if suicidal or call 911. Relevant Medications refilled or called in to pharmacy. Discussed weight maintenance and Sleep  Hygiene. Follow up with Primary care provider in regards to Medical conditions. Recommend compliance with medications and follow up office appointments. Discussed to avail opportunity to consider or/and continue Individual therapy with Counselor. Greater than 50% of time was spend in counseling and coordination of care with the patient.  Schedule for Follow up visit in 3 weeks or call in earlier as necessary.  Time spent ; 25 minutes  AMerian Capron MD 06/01/2014

## 2014-06-11 ENCOUNTER — Emergency Department (HOSPITAL_COMMUNITY)
Admission: EM | Admit: 2014-06-11 | Discharge: 2014-06-11 | Disposition: A | Discharge status: Home / Self Care | Payer: Federal, State, Local not specified - PPO | Source: Home / Self Care | Attending: Family Medicine | Admitting: Family Medicine

## 2014-06-11 ENCOUNTER — Encounter (HOSPITAL_COMMUNITY): Payer: Self-pay | Admitting: Emergency Medicine

## 2014-06-11 DIAGNOSIS — R52 Pain, unspecified: Secondary | ICD-10-CM | POA: Diagnosis not present

## 2014-06-11 DIAGNOSIS — R300 Dysuria: Secondary | ICD-10-CM

## 2014-06-11 LAB — POCT PREGNANCY, URINE: Preg Test, Ur: NEGATIVE

## 2014-06-11 LAB — POCT URINALYSIS DIP (DEVICE)
Bilirubin Urine: NEGATIVE
GLUCOSE, UA: NEGATIVE mg/dL
Hgb urine dipstick: NEGATIVE
Ketones, ur: NEGATIVE mg/dL
Leukocytes, UA: NEGATIVE
NITRITE: NEGATIVE
PH: 6.5 (ref 5.0–8.0)
Protein, ur: NEGATIVE mg/dL
SPECIFIC GRAVITY, URINE: 1.015 (ref 1.005–1.030)
UROBILINOGEN UA: 0.2 mg/dL (ref 0.0–1.0)

## 2014-06-11 MED ORDER — CIPROFLOXACIN HCL 500 MG PO TABS: 500.0000 mg | ORAL_TABLET | Freq: Two times a day (BID) | ORAL | Status: DC

## 2014-06-11 NOTE — ED Provider Notes (Signed)
Tiffany [name] is a 39 y.o. female who presents to Urgent Care today for body aches headaches and dysuria. Patient was in her normal state of health until today when she developed body aches and headaches associated with urinary frequency urgency and dysuria. No fevers or chills vomiting or diarrhea. She attributed these symptoms to her recent medication changes. She is battling severe depression and anxiety. She takes Wellbutrin, and Cymbalta. She recently was switched from Zoloft to Cymbalta.  She's been on Cymbalta for about 2 weeks.  Instead of taking Cymbalta this morning she took Zoloft but that did not help. She has not tried any medications for her symptoms.   Past Medical History  Diagnosis Date  . Anxiety   . Depression   . Umbilical hernia   . Anemia    Past Surgical History  Procedure Laterality Date  . Cesarean section      2003  . Tubal ligation      2013  . Appendectomy      2002  . Laparoscopic hysterectomy N/A 01/06/2014    Procedure: Attempted  TOTAL LAPAROSCOPIC Hysterectomy,;  Surgeon: Delice Lesch, MD;  Location: [facility];  Service: Gynecology;  Laterality: N/A;  . Abdominal hysterectomy Bilateral 01/06/2014    Procedure: HYSTERECTOMY ABDOMINAL WITH BILATERAL SALPINGECTOMY ;  Surgeon: Delice Lesch, MD;  Location: [facility];  Service: Gynecology;  Laterality: Bilateral;  . Transverse colon resection N/A 01/06/2014    Procedure: OVERSEW OF SEROSA TEAR OF THE TRANSVERSE COLON ;  Surgeon: Alphonsa Overall, MD;  Location: [facility];  Service: General;  Laterality: N/A;  . Abdominal hysterectomy  2016   History  Substance Use Topics  . Smoking status: Never Smoker   . Smokeless tobacco: Never Used  . Alcohol Use: No   ROS as above Medications: No current facility-administered medications for this encounter.   Current Outpatient Prescriptions  Medication Sig Dispense Refill  . acetaminophen (TYLENOL) 325 MG tablet Take 650 mg by mouth every 6 (six) hours as needed for  mild pain.    Marland Kitchen buPROPion (WELLBUTRIN XL) 150 MG 24 hr tablet Take 1 tablet (150 mg total) by mouth every morning. 30 tablet 0  . ciprofloxacin (CIPRO) 500 MG tablet Take 1 tablet (500 mg total) by mouth 2 (two) times daily. 14 tablet 0  . clobetasol ointment (TEMOVATE) 7.82 % Apply 1 application topically 2 (two) times daily.     . clonazePAM (KLONOPIN) 0.5 MG tablet Take 1 tablet (0.5 mg total) by mouth daily as needed for anxiety. 30 tablet 0  . ferrous sulfate 325 (65 FE) MG tablet Take 325 mg by mouth 2 (two) times daily with a meal.    . Multiple Vitamin (MULTIVITAMIN) capsule Take 1 capsule by mouth daily.    . sertraline (ZOLOFT) 100 MG tablet Take 1.5 tablets (150 mg total) by mouth daily. 45 tablet 0   Allergies  Allergen Reactions  . Mango Flavor Anaphylaxis and Swelling  . Penicillins Itching and Swelling     Exam:  BP 137/83 mmHg  Pulse 72  Temp(Src) 98.8 F (37.1 C) (Oral)  Resp 16  SpO2 100%  LMP 10/24/2013 Gen: Well NAD HEENT: EOMI,  MMM Lungs: Normal work of breathing. CTABL Heart: RRR no MRG Abd: NABS, Soft. Nondistended, Nontender No CV angle TTP.  Exts: Brisk capillary refill, warm and well perfused.   Results for orders placed or performed during the hospital encounter of 06/11/14 (from the past 24 hour(s))  POCT urinalysis dip (device)  Status: None   Collection Time: 06/11/14  8:02 PM  Result Value Ref Range   Glucose, UA NEGATIVE NEGATIVE mg/dL   Bilirubin Urine NEGATIVE NEGATIVE   Ketones, ur NEGATIVE NEGATIVE mg/dL   Specific Gravity, Urine 1.015 1.005 - 1.030   Hgb urine dipstick NEGATIVE NEGATIVE   pH 6.5 5.0 - 8.0   Protein, ur NEGATIVE NEGATIVE mg/dL   Urobilinogen, UA 0.2 0.0 - 1.0 mg/dL   Nitrite NEGATIVE NEGATIVE   Leukocytes, UA NEGATIVE NEGATIVE  Pregnancy, urine POC     Status: None   Collection Time: 06/11/14  8:03 PM  Result Value Ref Range   Preg Test, Ur NEGATIVE NEGATIVE   No results found.  Assessment and Plan: 39 y.o.  female with  1) body aches and headache. Likely viral URI. Doubtful for side effects from Cymbalta. Follow-up with PCP. Tylenol or ibuprofen for symptoms. 2) dysuria. Culture pending. Unclear etiology. Treat with Cipro empirically. Return as needed  Discussed warning signs or symptoms. Please see discharge instructions. Patient expresses understanding.     Gregor Hams, MD 06/11/14 2024

## 2014-06-11 NOTE — Discharge Instructions (Signed)
Thank you for coming in today.  If your belly pain worsens, or you have high fever, bad vomiting, blood in your stool or black tarry stool go to the Emergency Room.  Take tylenol or ibuprofen for pain.  Take the cipro for UTI symptoms.  Come back as needed.  Follow up with Dr. Alroy Dust.   Return as needed.  Resume cymbalta tomorrow.    Urinary Tract Infection Urinary tract infections (UTIs) can develop anywhere along your urinary tract. Your urinary tract is your body's drainage system for removing wastes and extra water. Your urinary tract includes two kidneys, two ureters, a bladder, and a urethra. Your kidneys are a pair of bean-shaped organs. Each kidney is about the size of your fist. They are located below your ribs, one on each side of your spine. CAUSES Infections are caused by microbes, which are microscopic organisms, including fungi, viruses, and bacteria. These organisms are so small that they can only be seen through a microscope. Bacteria are the microbes that most commonly cause UTIs. SYMPTOMS  Symptoms of UTIs may vary by age and gender of the patient and by the location of the infection. Symptoms in young women typically include a frequent and intense urge to urinate and a painful, burning feeling in the bladder or urethra during urination. Older women and men are more likely to be tired, shaky, and weak and have muscle aches and abdominal pain. A fever may mean the infection is in your kidneys. Other symptoms of a kidney infection include pain in your back or sides below the ribs, nausea, and vomiting. DIAGNOSIS To diagnose a UTI, your caregiver will ask you about your symptoms. Your caregiver also will ask to provide a urine sample. The urine sample will be tested for bacteria and white blood cells. White blood cells are made by your body to help fight infection. TREATMENT  Typically, UTIs can be treated with medication. Because most UTIs are caused by a bacterial infection,  they usually can be treated with the use of antibiotics. The choice of antibiotic and length of treatment depend on your symptoms and the type of bacteria causing your infection. HOME CARE INSTRUCTIONS  If you were prescribed antibiotics, take them exactly as your caregiver instructs you. Finish the medication even if you feel better after you have only taken some of the medication.  Drink enough water and fluids to keep your urine clear or pale yellow.  Avoid caffeine, tea, and carbonated beverages. They tend to irritate your bladder.  Empty your bladder often. Avoid holding urine for long periods of time.  Empty your bladder before and after sexual intercourse.  After a bowel movement, women should cleanse from front to back. Use each tissue only once. SEEK MEDICAL CARE IF:   You have back pain.  You develop a fever.  Your symptoms do not begin to resolve within 3 days. SEEK IMMEDIATE MEDICAL CARE IF:   You have severe back pain or lower abdominal pain.  You develop chills.  You have nausea or vomiting.  You have continued burning or discomfort with urination. MAKE SURE YOU:   Understand these instructions.  Will watch your condition.  Will get help right away if you are not doing well or get worse. Document Released: 10/19/2004 Document Revised: 07/11/2011 Document Reviewed: 02/17/2011 [facility] Patient Information 2015 [place], [place]. This information is not intended to replace advice given to you by your health care provider. Make sure you discuss any questions you have with your health  care provider.  

## 2014-06-11 NOTE — ED Notes (Signed)
See provider's note

## 2014-06-13 LAB — URINE CULTURE
Colony Count: >=100000
Special Requests: NORMAL

## 2014-06-25 ENCOUNTER — Encounter (HOSPITAL_COMMUNITY): Payer: Self-pay | Admitting: Psychiatry

## 2014-06-25 ENCOUNTER — Ambulatory Visit (INDEPENDENT_AMBULATORY_CARE_PROVIDER_SITE_OTHER): Payer: Federal, State, Local not specified - PPO | Admitting: Psychiatry

## 2014-06-25 VITALS — BP 126/62 | HR 81 | Ht 67.0 in | Wt 232.0 lb

## 2014-06-25 DIAGNOSIS — F411 Generalized anxiety disorder: Secondary | ICD-10-CM

## 2014-06-25 DIAGNOSIS — F4311 Post-traumatic stress disorder, acute: Secondary | ICD-10-CM

## 2014-06-25 DIAGNOSIS — F331 Major depressive disorder, recurrent, moderate: Secondary | ICD-10-CM | POA: Diagnosis not present

## 2014-06-25 DIAGNOSIS — F410 Panic disorder [episodic paroxysmal anxiety] without agoraphobia: Secondary | ICD-10-CM

## 2014-06-25 NOTE — Progress Notes (Addendum)
Patient ID: Tiffany [name], female   DOB: 1975/01/25, 39 y.o.   MRN: [id number]  Tiffany [name]  Tiffany [name] [id number] 39 y.o.  06/25/2014 3:53 PM  Chief Complaint:  Depression, anxiety stress related to work.   History of Present Illness:   Patient Presents for  Early follow up and medication management for Derpession and anxiety.  Acute stress disorder  Patient is 39 years old currently married African-American female. She is living with her husband and 4 kids. They have recently moved from [place] she works with [employer] for the last 20 years. Initially presented with the following "Says that she has been on different medications starting couple of years ago when she was having stress related to work she started having panic attacks when her manager giving her a hard time. She also had gone to ED and rule out any cardiac problems and heart back since she was having chest pain palpitations as if she is going to choke or die. After that she was diagnosed with generalized anxiety disorder and follows up with her primary care physician. She did change her job despite that she still has had anxiety. Says that Wellbutrin helped her depression but it did not help her anxiety. She's been on different medications in the past including Paxil that made her body warm. Lexapro made her sleepy Zoloft made her cry."  Since last [name] she has visited her primary care and medication interventions to Cymbalta. She is not taking Zoloft Cymbalta did help her depression and anxiety so some extent. She takes Klonopin on a when necessary basis for extreme anxiety. Stress disorder ongoing related to apprehension of how she will handle work if she has to start .  Apparently she tried to get a local job but was not able to because of not good references from old job. She is coming in early for [name] as she needs a letter of reasonable accommodation to work which would be  to keep away from stress and dealing with the supervisor, being able to work from home. Continues to have anxiety, apprehension of going back to work and deal with supervisor or job stress. This makes her panicky and feeling of loosing control.  She has started seeing a therapist Mrs. Pulliam Collins. She has also recently starting intensive outpatient treatment or therapy. She hopes that will help her with anxiety and to deal with stress.  Depression/anxiety' more so related to work stress. continous apprehension.  She also takes klonopine prn but not regularly   Worry and anxiety related to going back to work and panic apprehension is her main stressor.  Severity: 4/10 .  10 being no depression. Anxiety ;8/10 . 10 being extreme anxiety Context: job stress, worried about her job Modifying factors: supportive husband and family. Does not endorse hopelessness suicidal or homicidal thoughts. Does not endorse delusions or hallucinations, there is no psychotic symptoms. There is no current manic symptoms over the past does not endorse any paranoia.     Hospitalization for psychiatric illness: No History of Electroconvulsive Shock Therapy: No Prior Suicide Attempts: No  Medical History; Past Medical History  Diagnosis Date  . Anxiety   . Depression   . Umbilical hernia   . Anemia     Allergies: Allergies  Allergen Reactions  . Mango Flavor Anaphylaxis and Swelling  . Penicillins Itching and Swelling    Medications: Outpatient Encounter Prescriptions as of 06/25/2014  Medication Sig  . acetaminophen (TYLENOL) 325  MG tablet Take 650 mg by mouth every 6 (six) hours as needed for mild pain.  Marland Kitchen buPROPion (WELLBUTRIN XL) 150 MG 24 hr tablet Take 1 tablet (150 mg total) by mouth every morning.  . ciprofloxacin (CIPRO) 500 MG tablet Take 1 tablet (500 mg total) by mouth 2 (two) times daily.  . clobetasol ointment (TEMOVATE) 1.60 % Apply 1 application topically 2 (two) times daily.   .  clonazePAM (KLONOPIN) 0.5 MG tablet Take 1 tablet (0.5 mg total) by mouth daily as needed for anxiety.  . DULoxetine (CYMBALTA) 30 MG capsule   . ferrous sulfate 325 (65 FE) MG tablet Take 325 mg by mouth 2 (two) times daily with a meal.  . Multiple Vitamin (MULTIVITAMIN) capsule Take 1 capsule by mouth daily.  . sertraline (ZOLOFT) 100 MG tablet Take 1.5 tablets (150 mg total) by mouth daily.   No facility-administered encounter medications on file as of 06/25/2014.    Family History; Family History  Problem Relation Age of Onset  . Heart disease Mother 63    heart attack  . Depression Mother   . Hypertension Father 32      Labs:  Recent Results (from the past 2160 hour(s))  Urinalysis, Routine w reflex microscopic     Status: Abnormal   Collection Time: 05/10/14  6:44 PM  Result Value Ref Range   Color, Urine YELLOW YELLOW   APPearance CLOUDY (A) CLEAR   Specific Gravity, Urine 1.019 1.005 - 1.030   pH 8.0 5.0 - 8.0   Glucose, UA NEGATIVE NEGATIVE mg/dL   Hgb urine dipstick NEGATIVE NEGATIVE   Bilirubin Urine NEGATIVE NEGATIVE   Ketones, ur NEGATIVE NEGATIVE mg/dL   Protein, ur NEGATIVE NEGATIVE mg/dL   Urobilinogen, UA 0.2 0.0 - 1.0 mg/dL   Nitrite NEGATIVE NEGATIVE   Leukocytes, UA NEGATIVE NEGATIVE    Comment: MICROSCOPIC NOT DONE ON URINES WITH NEGATIVE PROTEIN, BLOOD, LEUKOCYTES, NITRITE, OR GLUCOSE <1000 mg/dL.  CBC with Differential     Status: Abnormal   Collection Time: 05/10/14  6:51 PM  Result Value Ref Range   WBC 8.7 4.0 - 10.5 K/uL   RBC 4.61 3.87 - 5.11 MIL/uL   Hemoglobin 11.1 (L) 12.0 - 15.0 g/dL   HCT 34.6 (L) 36.0 - 46.0 %   MCV 75.1 (L) 78.0 - 100.0 fL   MCH 24.1 (L) 26.0 - 34.0 pg   MCHC 32.1 30.0 - 36.0 g/dL   RDW 16.6 (H) 11.5 - 15.5 %   Platelets 369 150 - 400 K/uL   Neutrophils Relative % 62 43 - 77 %   Neutro Abs 5.4 1.7 - 7.7 K/uL   Lymphocytes Relative 25 12 - 46 %   Lymphs Abs 2.1 0.7 - 4.0 K/uL   Monocytes Relative 11 3 - 12 %    Monocytes Absolute 1.0 0.1 - 1.0 K/uL   Eosinophils Relative 1 0 - 5 %   Eosinophils Absolute 0.1 0.0 - 0.7 K/uL   Basophils Relative 1 0 - 1 %   Basophils Absolute 0.1 0.0 - 0.1 K/uL  Comprehensive metabolic panel     Status: Abnormal   Collection Time: 05/10/14  6:51 PM  Result Value Ref Range   Sodium 138 135 - 145 mmol/L   Potassium 3.9 3.5 - 5.1 mmol/L   Chloride 104 96 - 112 mmol/L   CO2 25 19 - 32 mmol/L   Glucose, Bld 89 70 - 99 mg/dL   BUN 6 6 - 23 mg/dL  Creatinine, Ser 0.91 0.50 - 1.10 mg/dL   Calcium 8.8 8.4 - 10.5 mg/dL   Total Protein 7.1 6.0 - 8.3 g/dL   Albumin 3.7 3.5 - 5.2 g/dL   AST 18 0 - 37 U/L   ALT 15 0 - 35 U/L   Alkaline Phosphatase 66 39 - 117 U/L   Total Bilirubin 0.4 0.3 - 1.2 mg/dL   GFR calc non Af Amer 78 (L) >90 mL/min   GFR calc Af Amer >90 >90 mL/min    Comment: (NOTE) The eGFR has been calculated using the CKD EPI equation. This calculation has not been validated in all clinical situations. eGFR's persistently <90 mL/min signify possible Chronic Kidney Disease.    Anion gap 9 5 - 15  Lipase, blood     Status: None   Collection Time: 05/10/14  6:51 PM  Result Value Ref Range   Lipase 27 11 - 59 U/L  I-Stat Troponin, ED (not at [facility])     Status: None   Collection Time: 05/10/14  8:28 PM  Result Value Ref Range   Troponin i, poc 0.00 0.00 - 0.08 ng/mL   Comment 3            Comment: Due to the release kinetics of cTnI, a negative result within the first hours of the onset of symptoms does not rule out myocardial infarction with certainty. If myocardial infarction is still suspected, repeat the test at appropriate intervals.   POCT urinalysis dip (device)     Status: None   Collection Time: 06/11/14  8:02 PM  Result Value Ref Range   Glucose, UA NEGATIVE NEGATIVE mg/dL   Bilirubin Urine NEGATIVE NEGATIVE   Ketones, ur NEGATIVE NEGATIVE mg/dL   Specific Gravity, Urine 1.015 1.005 - 1.030   Hgb urine dipstick NEGATIVE NEGATIVE   pH  6.5 5.0 - 8.0   Protein, ur NEGATIVE NEGATIVE mg/dL   Urobilinogen, UA 0.2 0.0 - 1.0 mg/dL   Nitrite NEGATIVE NEGATIVE   Leukocytes, UA NEGATIVE NEGATIVE    Comment: Biochemical Testing Only. Please order routine urinalysis from main lab if confirmatory testing is needed.  Pregnancy, urine POC     Status: None   Collection Time: 06/11/14  8:03 PM  Result Value Ref Range   Preg Test, Ur NEGATIVE NEGATIVE    Comment:        THE SENSITIVITY OF THIS METHODOLOGY IS >24 mIU/mL   Urine culture     Status: None   Collection Time: 06/11/14  8:11 PM  Result Value Ref Range   Specimen Description URINE, CLEAN CATCH    Special Requests Normal    Colony Count      >=100,000 COLONIES/ML Performed at [facility]    Culture      Multiple bacterial morphotypes present, none predominant. Suggest appropriate recollection if clinically indicated. Performed at [facility]    Report Status 06/13/2014 FINAL        Musculoskeletal: Strength & Muscle Tone: within normal limits Gait & Station: normal Patient leans: N/A  Mental Status Examination;   Psychiatric Specialty Exam: Physical Exam  Constitutional: She appears well-developed and well-nourished. No distress.  HENT:  Head: Normocephalic and atraumatic.  Skin: She is not diaphoretic.    Review of Systems  Constitutional: Negative.   Gastrointestinal: Negative for nausea.  Skin: Negative for rash.  Psychiatric/Behavioral: Positive for depression. The patient is nervous/anxious.     Blood pressure 126/62, pulse 81, height _0  (1.702 m), weight 232  lb (105.235 kg), last menstrual period 10/24/2013.Body mass index is 36.33 kg/(m^2).  General Appearance: Casual  Eye Contact::  Fair  Speech:  Normal Rate  Volume:  Normal  Mood:  Dysphoric and anxious  Affect:  Congruent  Thought Process:  Coherent  Orientation:  Full (Time, Place, and Person)  Thought Content:  Rumination  Suicidal Thoughts:  No  Homicidal  Thoughts:  No  Memory:  Immediate;   Fair Recent;   Fair  Judgement:  Fair  Insight:  Fair  Psychomotor Activity:  Normal  Concentration:  Fair  Recall:  Fair  Akathisia:  Negative  Handed:  Right  AIMS (if indicated):     Assets:  Communication Skills Desire for Improvement Financial Resources/Insurance Housing Intimacy  Sleep:        Assessment: Axis I: Major depressive disorder recurrent moderate. Generalized anxiety disorder. Panic disorder. Acute stress disorder  Axis II: Deferred  Axis III:  Past Medical History  Diagnosis Date  . Anxiety   . Depression   . Umbilical hernia   . Anemia     Axis IV: Psychosocial recently moved from [place]. Job stress. Recent hysterectomy   Treatment Plan and Summary: Discontinue zoloft, not taking Continue cymbalta 26m, she has medication. Can take regular  Or prn klonopine [id number] for anxiety.   She is coming in early for [name] as she needs a letter of reasonable accommodation to work which would be to keep away from stress and dealing with the supervisor, being able to work from home. I will right it down.  Continue therapy IOP  in regard to stress and how to cope with related to work if she starts.  Increase daily acitivities and continue therapy with her counsellor.   Medication Side effects, benefits and risks reviewed/discussed with Patient. Time given for patient to respond and asks questions regarding the Diagnosis and Medications. Safety concerns and to report to ER if suicidal or call 911. Relevant Medications refilled or called in to pharmacy.  Follow up with Primary care provider in regards to Medical conditions. Recommend compliance with medications and follow up office appointments. Discussed to avail opportunity to consider or/and continue Individual therapy with Counselor. Greater than 50% of time was spend in counseling and coordination of care with the patient.  Schedule for Follow up [name] in 3 weeks or call  in earlier as necessary.    AKMerian CapronMD 06/25/2014

## 2014-06-30 ENCOUNTER — Ambulatory Visit (HOSPITAL_COMMUNITY): Payer: Self-pay | Admitting: Psychiatry

## 2014-07-01 ENCOUNTER — Telehealth: Payer: Self-pay | Admitting: Medical

## 2014-07-01 NOTE — Telephone Encounter (Signed)
I left a detailed message on the patient's voicemail about setting up an appointment

## 2014-07-01 NOTE — Telephone Encounter (Signed)
Schedule a visit or CPX.  Also, copy and mail back the advisory.  Juluis Rainier - I received [employer] advisory that she was getting controlled substances from several different providers.   Need to discuss)

## 2014-07-02 ENCOUNTER — Other Ambulatory Visit (HOSPITAL_COMMUNITY): Payer: Federal, State, Local not specified - PPO | Admitting: Psychiatry

## 2014-07-02 ENCOUNTER — Encounter (HOSPITAL_COMMUNITY): Payer: Self-pay | Admitting: Psychiatry

## 2014-07-02 DIAGNOSIS — R45851 Suicidal ideations: Secondary | ICD-10-CM | POA: Insufficient documentation

## 2014-07-02 DIAGNOSIS — F411 Generalized anxiety disorder: Secondary | ICD-10-CM | POA: Diagnosis not present

## 2014-07-02 DIAGNOSIS — F410 Panic disorder [episodic paroxysmal anxiety] without agoraphobia: Secondary | ICD-10-CM | POA: Insufficient documentation

## 2014-07-02 DIAGNOSIS — F331 Major depressive disorder, recurrent, moderate: Secondary | ICD-10-CM | POA: Diagnosis not present

## 2014-07-02 NOTE — Progress Notes (Signed)
Psychiatric Initial Adult Assessment   Patient Identification: Tiffany [name] MRN:  [id number] Date of Evaluation:  07/02/2014 Referral Source: Clotilde Dieter, her outpatient therapist Chief Complaint:  depression and anxiety aggravated by job situation Visit Diagnosis:    ICD-9-CM ICD-10-CM   1. Depression, major, recurrent, moderate 296.32 F33.1   2. Generalized anxiety disorder 300.02 F41.1    Diagnosis:   Patient Active Problem List   Diagnosis Date Noted  . Depression, major, recurrent, moderate [F33.1] 07/02/2014    Priority: Medium    Class: Chronic  . Generalized anxiety disorder [F41.1] 07/02/2014    Priority: Medium    Class: Chronic  . Abdominal pain [R10.9] 02/22/2014  . Right sided abdominal pain [R10.9] 02/20/2014  . Urinary incontinence in female [R32] 02/20/2014  . Post-operative pain [G89.18] 01/28/2014  . Dysfunctional uterine bleeding [N93.8] 01/06/2014  . Ventral hernia [K43.9] 05/09/2013   History of Present Illness:  Tiffany [name] says she has been anxious all her life but has been able to deal with it on her own.  She was out in Dec 2015 for a hysterectomy followed by complications which resulted in being out for 2 months.  She reported that she was mistreated upon her return including having lost parts of her responsibilities which were not returned as well as being denied a workable computer to use when she worked at home.  She went through the usual appeals process and eventually had to contact her congressman to be fairly treated.  She felt bad about the way she was treated as well as the lengths she had to go to after pointing out the issues to her superiors.  Her anxiety which she had been able to control got out of control to the point of repeated ED visits and being unable to return to work.  At this point she is so uncomfortable working with her supervisors that panic attacks are very likely if she continues to work under her immediately superior, she believes.  She  says she has always been a good employee and this is all new to her.She does not want to quit her job but believes under the circumstances she needs certain accommodations which seem likely to be approved. Depression is currently present to the point of daily sadness, decreased energy, focus, interest and motivation.  She has decreased sleep disturbed by bad dreams.  Appetite is decreased but she has gained weight.  She has had suicidal thoughts but no plan or intent and at that point she and her therapist decided she needed mor intense intervention.  She has a good marriage and support system. Elements:  Location:  anxiety and depression. Quality:  loss of energy and motivation. Severity:  some suicidal thoughts but no plan or intent. Timing:  6 months. Duration:  6 months. Context:  as above. Associated Signs/Symptoms: Depression Symptoms:  depressed mood, anhedonia, insomnia, fatigue, difficulty concentrating, hopelessness, suicidal thoughts without plan, anxiety, panic attacks, (Hypo) Manic Symptoms:  Irritable Mood, Anxiety Symptoms:  Excessive Worry, Panic Symptoms, Psychotic Symptoms:  na PTSD Symptoms: Negative  Past Medical History:  Past Medical History  Diagnosis Date  . Anxiety   . Depression   . Umbilical hernia   . Anemia     Past Surgical History  Procedure Laterality Date  . Cesarean section      2003  . Tubal ligation      2013  . Appendectomy      2002  . Laparoscopic hysterectomy N/A 01/06/2014    Procedure:  Attempted  TOTAL LAPAROSCOPIC Hysterectomy,;  Surgeon: Delice Lesch, [place];  Location: [facility];  Service: Gynecology;  Laterality: N/A;  . Abdominal hysterectomy Bilateral 01/06/2014    Procedure: HYSTERECTOMY ABDOMINAL WITH BILATERAL SALPINGECTOMY ;  Surgeon: Delice Lesch, [place];  Location: [facility];  Service: Gynecology;  Laterality: Bilateral;  . Transverse colon resection N/A 01/06/2014    Procedure: OVERSEW OF SEROSA TEAR OF THE TRANSVERSE COLON  ;  Surgeon: Alphonsa Overall, [place];  Location: [facility];  Service: General;  Laterality: N/A;  . Abdominal hysterectomy  2016   Family History:  Family History  Problem Relation Age of Onset  . Heart disease Mother 4    heart attack  . Depression Mother   . Hypertension Father 66   Social History:   History   Social History  . Marital Status: Married    Spouse Name: N/A  . Number of Children: N/A  . Years of Education: N/A   Social History Main Topics  . Smoking status: Never Smoker   . Smokeless tobacco: Never Used  . Alcohol Use: No  . Drug Use: No  . Sexual Activity: Yes   Other Topics Concern  . None   Social History Narrative   Work or School: works for [employer]: lives with husband and 4 children (12-20yo), husband is supportive "wonderful"      Spiritual Beliefs: Christian      Lifestyle: walks 3 times per week; working on diet            Additional Social History: no real psychiatric issues till this current episode  Musculoskeletal: Strength & Muscle Tone: within normal limits Gait & Station: normal Patient leans: N/A  Psychiatric Specialty Exam: HPI  ROS  Last menstrual period 10/24/2013.There is no weight on file to calculate BMI.  General Appearance: Well Groomed  Eye Contact:  Good  Speech:  Clear and Coherent  Volume:  Normal  Mood:  Anxious  Affect:  Congruent  Thought Process:  Coherent and Logical  Orientation:  Full (Time, Place, and Person)  Thought Content:  Negative  Suicidal Thoughts:  No  Homicidal Thoughts:  No  Memory:  Immediate;   Good Recent;   Good Remote;   Good  Judgement:  Good  Insight:  Good  Psychomotor Activity:  Normal  Concentration:  Good  Recall:  Good  Fund of Knowledge:Good  Language: Good  Akathisia:  Negative  Handed:  Right  AIMS (if indicated):  0  Assets:  Communication Skills Desire for Improvement Financial  Resources/Insurance Housing Intimacy Leisure Time Physical Health Resilience Social Support Talents/Skills Transportation Vocational/Educational  ADL's:  Intact  Cognition: WNL  Sleep:  Poor quality and quantity   Is the patient at risk to self?  No. Has the patient been a risk to self in the past 6 months?  No. Has the patient been a risk to self within the distant past?  No. Is the patient a risk to others?  No. Has the patient been a risk to others in the past 6 months?  No. Has the patient been a risk to others within the distant past?  No.  Allergies:   Allergies  Allergen Reactions  . Mango Flavor Anaphylaxis and Swelling  . Penicillins Itching and Swelling   Current Medications: Current Outpatient Prescriptions  Medication Sig Dispense Refill  . acetaminophen (TYLENOL) 325 MG tablet Take 650 mg by mouth every 6 (  six) hours as needed for mild pain.    Marland Kitchen buPROPion (WELLBUTRIN XL) 150 MG 24 hr tablet Take 1 tablet (150 mg total) by mouth every morning. 30 tablet 0  . ciprofloxacin (CIPRO) 500 MG tablet Take 1 tablet (500 mg total) by mouth 2 (two) times daily. 14 tablet 0  . clobetasol ointment (TEMOVATE) 2.58 % Apply 1 application topically 2 (two) times daily.     . clonazePAM (KLONOPIN) 0.5 MG tablet Take 1 tablet (0.5 mg total) by mouth daily as needed for anxiety. 30 tablet 0  . DULoxetine (CYMBALTA) 30 MG capsule     . ferrous sulfate 325 (65 FE) MG tablet Take 325 mg by mouth 2 (two) times daily with a meal.    . Multiple Vitamin (MULTIVITAMIN) capsule Take 1 capsule by mouth daily.    . sertraline (ZOLOFT) 100 MG tablet Take 1.5 tablets (150 mg total) by mouth daily. 45 tablet 0   No current facility-administered medications for this visit.    Previous Psychotropic Medications: Yes   Substance Abuse History in the last 12 months:  No.  Consequences of Substance Abuse: Negative  Medical Decision Making:  Established Problem, Worsening (2)  Treatment Plan  Summary: daily group therapy.  Continue current meds    Clarene Reamer 6/9/20161:36 PM

## 2014-07-02 NOTE — Progress Notes (Signed)
    Daily Group Progress Note  Program: IOP  Group Time: 9:00-10:30  Participation Level: Active  Behavioral Response: Appropriate  Type of Therapy:  Group Therapy  Summary of Progress: Pt. Met with psychiatrist and case manager.      Group Time: 10:30-12:00  Participation Level:  Active  Behavioral Response: Appropriate  Type of Therapy: Psycho-education Group  Summary of Progress: Pt. Participated in discussion about recognizing cognitive distortions including over-generalization, all or nothing thinking, personalizing, emotional reasoning.  Nancie Neas, LPC

## 2014-07-02 NOTE — Progress Notes (Signed)
This is a 39 yr old, married, employed, [place] American female, who was referred per therapist Clotilde Dieter, [facility]); treatment for ongoing depressive and anxiety symptoms.  Denies SI/HI or A/V hallucinations.  Other symptoms include:  Isolation, decreased appetite, restless sleep, decreased energy, poor concentration, irritability, lack of motivation, no energy, anhedonia, tearfulness, and panic attacks.  Pt states she has been anxious all her life but has been able to deal with it on her own.   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  States it is a very hostile environment.  "Management bullies and create fear.  Currently I am working part time.  Been working from home since December 2016.  I work four days out of the month in our office in [place]."  She was out of work in Dec 2015 for a hysterectomy followed by complications which resulted in being out for 2 months. She reported that she was mistreated upon her return including having lost parts of her responsibilities which were not returned as well as being denied a workable computer to use when she worked at home. She went through the usual appeals process and eventually had to contact her congressman to be fairly treated. She felt bad about the way she was treated as well as the lengths she had to go to after pointing out the issues to her superiors. Her anxiety which she had been able to control got out of control to the point of repeated ED visits and being unable to return to work. At this point she is so uncomfortable working with her supervisors that panic attacks are very likely if she continues to work under her immediately superior, she believes. She says she has always been a good employee and this is all new to her.She does not want to quit her job but believes under the circumstances she needs reasonable accommodations which seem likely to be approved.  2)  Strained finances:  Reports that she hasn't gotten paid  since March 2016. Pt admits to staying overnight at a hospital in [place] (2010) due to a severe panic attack.  Denies previous suicide attempts.    Has been seeing Clotilde Dieter, [facility] since April 2016.  Pt has appointments 1-2 times per week.  Pt also sees Dr. De Nurse for medication management. Family Hx:  Father [facility]); Maternal Aunt (Depression and Anxiety); Paternal Grandmother (Anxiety) Childhood:  Born in [place], [place].  Grew up in [place].  Mother had pt whenever she was age 31 and father was age 39.  They married when pt was age 6.  Parents have been separated for years because neither one will pay for the divorce.  Pt states she witnessed a lot of arguing amongst them.  "I was very anxious as a child."  Pt states she did well in school, but due to the anxiety, she would vomit every morning before going to school. Siblings:  2 younger sisters and 2 younger brothers. Pt has four kids ( 61 yr old daughter, 3 yr old daughter, and twin boys who are 59 yrs old.)  States husband of 74 yrs marriage is very supportive; along with a female friend and her therapist. Pt denies any drugs/ETOH, past DUI's, cigarettes, and legal issues. Pt completed all forms.  Pt will attend [facility] for ten days.  A:  Oriented pt.  Informed Dr. De Nurse and Clotilde Dieter, [facility] of admit.  Provided pt with an orientation folder.  Encouraged support groups.  R:  Pt receptive.

## 2014-07-03 ENCOUNTER — Other Ambulatory Visit (HOSPITAL_COMMUNITY): Payer: Federal, State, Local not specified - PPO

## 2014-07-03 DIAGNOSIS — F331 Major depressive disorder, recurrent, moderate: Secondary | ICD-10-CM | POA: Diagnosis not present
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
## 2014-07-06 ENCOUNTER — Other Ambulatory Visit (HOSPITAL_COMMUNITY): Payer: Federal, State, Local not specified - PPO | Admitting: Psychiatry

## 2014-07-06 ENCOUNTER — Telehealth (HOSPITAL_COMMUNITY): Payer: Self-pay | Admitting: Psychiatry

## 2014-07-06 ENCOUNTER — Encounter (HOSPITAL_COMMUNITY): Payer: Self-pay | Admitting: Psychiatry

## 2014-07-06 ENCOUNTER — Emergency Department (HOSPITAL_COMMUNITY)
Admission: EM | Admit: 2014-07-06 | Discharge: 2014-07-07 | Disposition: A | Discharge status: Home / Self Care | Payer: Federal, State, Local not specified - PPO | Attending: Emergency Medicine | Admitting: Emergency Medicine

## 2014-07-06 DIAGNOSIS — Z79899 Other long term (current) drug therapy: Secondary | ICD-10-CM | POA: Diagnosis not present

## 2014-07-06 DIAGNOSIS — Z8719 Personal history of other diseases of the digestive system: Secondary | ICD-10-CM | POA: Diagnosis not present

## 2014-07-06 DIAGNOSIS — F329 Major depressive disorder, single episode, unspecified: Secondary | ICD-10-CM | POA: Diagnosis not present

## 2014-07-06 DIAGNOSIS — Z862 Personal history of diseases of the blood and blood-forming organs and certain disorders involving the immune mechanism: Secondary | ICD-10-CM | POA: Diagnosis not present

## 2014-07-06 DIAGNOSIS — Z792 Long term (current) use of antibiotics: Secondary | ICD-10-CM | POA: Insufficient documentation

## 2014-07-06 DIAGNOSIS — Z7952 Long term (current) use of systemic steroids: Secondary | ICD-10-CM | POA: Diagnosis not present

## 2014-07-06 DIAGNOSIS — F411 Generalized anxiety disorder: Secondary | ICD-10-CM | POA: Diagnosis not present

## 2014-07-06 DIAGNOSIS — Z880 Allergy status to penicillin: Secondary | ICD-10-CM | POA: Diagnosis not present

## 2014-07-06 DIAGNOSIS — R0789 Other chest pain: Secondary | ICD-10-CM | POA: Diagnosis not present

## 2014-07-06 DIAGNOSIS — F331 Major depressive disorder, recurrent, moderate: Secondary | ICD-10-CM

## 2014-07-06 DIAGNOSIS — R079 Chest pain, unspecified: Secondary | ICD-10-CM | POA: Diagnosis present

## 2014-07-06 NOTE — Telephone Encounter (Signed)
Patient has been accepted at IOP in [place]. She is requesting that she wants to follow with her medication and psychiatry services in [place] also.  Patient can be transferred to that office for her services.

## 2014-07-07 ENCOUNTER — Other Ambulatory Visit (HOSPITAL_COMMUNITY): Payer: Federal, State, Local not specified - PPO | Admitting: Psychiatry

## 2014-07-07 ENCOUNTER — Telehealth (HOSPITAL_COMMUNITY): Payer: Self-pay | Admitting: *Deleted

## 2014-07-07 ENCOUNTER — Encounter (HOSPITAL_COMMUNITY): Payer: Self-pay | Admitting: *Deleted

## 2014-07-07 ENCOUNTER — Emergency Department (HOSPITAL_COMMUNITY): Payer: Federal, State, Local not specified - PPO

## 2014-07-07 DIAGNOSIS — F331 Major depressive disorder, recurrent, moderate: Secondary | ICD-10-CM | POA: Diagnosis not present

## 2014-07-07 DIAGNOSIS — F411 Generalized anxiety disorder: Secondary | ICD-10-CM

## 2014-07-07 LAB — BASIC METABOLIC PANEL
Anion gap: 6 (ref 5–15)
BUN: 12 mg/dL (ref 6–20)
CALCIUM: 9.1 mg/dL (ref 8.9–10.3)
CO2: 25 mmol/L (ref 22–32)
CREATININE: 0.94 mg/dL (ref 0.44–1.00)
Chloride: 103 mmol/L (ref 101–111)
GLUCOSE: 92 mg/dL (ref 65–99)
Potassium: 3.8 mmol/L (ref 3.5–5.1)
SODIUM: 134 mmol/L — AB (ref 135–145)

## 2014-07-07 LAB — CBC
HCT: 35.6 % — ABNORMAL LOW (ref 36.0–46.0)
Hemoglobin: 11.5 g/dL — ABNORMAL LOW (ref 12.0–15.0)
MCH: 24.5 pg — ABNORMAL LOW (ref 26.0–34.0)
MCHC: 32.3 g/dL (ref 30.0–36.0)
MCV: 75.9 fL — ABNORMAL LOW (ref 78.0–100.0)
Platelets: 357 10*3/uL (ref 150–400)
RBC: 4.69 MIL/uL (ref 3.87–5.11)
RDW: 16.2 % — AB (ref 11.5–15.5)
WBC: 10 10*3/uL (ref 4.0–10.5)

## 2014-07-07 LAB — I-STAT TROPONIN, ED: Troponin i, poc: 0 ng/mL (ref 0.00–0.08)

## 2014-07-07 MED ORDER — LORAZEPAM 1 MG PO TABS
1.0000 mg | ORAL_TABLET | Freq: Once | ORAL | Status: AC
  Administered 2014-07-07: 1 mg via ORAL
  Filled 2014-07-07: qty 1

## 2014-07-07 MED ORDER — GI COCKTAIL ~~LOC~~
30.0000 mL | Freq: Once | ORAL | Status: AC
  Administered 2014-07-07: 30 mL via ORAL
  Filled 2014-07-07: qty 30

## 2014-07-07 MED ORDER — CLONAZEPAM 1 MG PO TABS: 1.0000 mg | ORAL_TABLET | Freq: Three times a day (TID) | ORAL | Status: AC

## 2014-07-07 MED ORDER — DULOXETINE HCL 30 MG PO CPEP: 30.0000 mg | ORAL_CAPSULE | Freq: Two times a day (BID) | ORAL | Status: DC

## 2014-07-07 NOTE — Progress Notes (Signed)
    Daily Group Progress Note  Program: IOP  Group Time: 9:00-10:30  Participation Level: Active  Behavioral Response: Appropriate  Type of Therapy:  Group Therapy  Summary of Progress: Pt. Presented as talkative, anxious. Pt. Reported that anxiety was high over the weekend, anticipating return to work on Monday. Pt. Discussed that work and interactions with her supervisor, guilt related to loss of income and ability to contribute to finances as most significant stressors.      Group Time: 10:30-12:00  Participation Level:  Active  Behavioral Response: Appropriate  Type of Therapy: Psycho-education Group  Summary of Progress: Pt. Participated in meditation exercise and grounding sequence to help with management of anxiety.   Nancie Neas, LPC

## 2014-07-07 NOTE — ED Provider Notes (Signed)
CSN: [id number]     Arrival date & time 07/06/14  2340 History   First MD Initiated Contact with Patient 07/07/14 0030     Chief Complaint  Patient presents with  . Chest Pain     (Consider location/radiation/quality/duration/timing/severity/associated sxs/prior Treatment) HPI 39 year old female presents to the emergency department with complaint of chest pain.  Pain started at 6:00 yesterday, described as a dull, heavy sensation.  She has some radiation under her left breast.  Patient complains of generalized fatigue, nausea, shortness of breath and headache.  Patient reports she's had similar complaints in the past associated with panic attacks.  She reports that she took her Klonopin without improvement.  She is in an intensive outpatient daily psychiatric program currently.  She has just started.  Patient reports that her mother had a heart attack with cardiac arrest at age 24 due to extreme stress.  Her mother does not currently have a pacemaker and is unsure if her mother had stents placed.  Symptoms have been constant since onset.  No treatment other than Klonopin prior to arrival. Past Medical History  Diagnosis Date  . Anxiety   . Depression   . Umbilical hernia   . Anemia    Past Surgical History  Procedure Laterality Date  . Cesarean section      2003  . Tubal ligation      2013  . Appendectomy      2002  . Laparoscopic hysterectomy N/A 01/06/2014    Procedure: Attempted  TOTAL LAPAROSCOPIC Hysterectomy,;  Surgeon: Delice Lesch, MD;  Location: [facility];  Service: Gynecology;  Laterality: N/A;  . Abdominal hysterectomy Bilateral 01/06/2014    Procedure: HYSTERECTOMY ABDOMINAL WITH BILATERAL SALPINGECTOMY ;  Surgeon: Delice Lesch, MD;  Location: [facility];  Service: Gynecology;  Laterality: Bilateral;  . Transverse colon resection N/A 01/06/2014    Procedure: OVERSEW OF SEROSA TEAR OF THE TRANSVERSE COLON ;  Surgeon: Alphonsa Overall, MD;  Location: [facility];  Service: General;   Laterality: N/A;  . Abdominal hysterectomy  2016   Family History  Problem Relation Age of Onset  . Heart disease Mother 78    heart attack  . Depression Mother   . Hypertension Father 36  . Drug abuse Father   . Depression Maternal Aunt   . Anxiety disorder Maternal Aunt   . Anxiety disorder Paternal Grandmother    History  Substance Use Topics  . Smoking status: Never Smoker   . Smokeless tobacco: Never Used  . Alcohol Use: No   OB History    Gravida Para Term Preterm AB TAB SAB Ectopic Multiple Living   4    1  1   4      Review of Systems  See History of Present Illness; otherwise all other systems are reviewed and negative   Allergies  Mango flavor; Amoxicillin; and Penicillins  Home Medications   Prior to Admission medications   Medication Sig Start Date End Date Taking? Authorizing Provider  acetaminophen (TYLENOL) 325 MG tablet Take 650 mg by mouth every 6 (six) hours as needed for mild pain.   Yes Historical Provider, MD  buPROPion (WELLBUTRIN XL) 150 MG 24 hr tablet Take 1 tablet (150 mg total) by mouth every morning. Patient taking differently: Take 150 mg by mouth 2 (two) times daily.  06/01/14 06/01/15 Yes Merian Capron, MD  clobetasol ointment (TEMOVATE) 0.97 % Apply 1 application topically daily.    Yes Historical Provider, MD  clonazePAM (KLONOPIN) 0.5 MG  tablet Take 1 tablet (0.5 mg total) by mouth daily as needed for anxiety. 05/13/14  Yes Merian Capron, MD  DULoxetine (CYMBALTA) 30 MG capsule Take 30 mg by mouth 2 (two) times daily.  05/29/14  Yes Historical Provider, MD  ferrous sulfate 325 (65 FE) MG tablet Take 325 mg by mouth 2 (two) times daily with a meal.   Yes Historical Provider, MD  Multiple Vitamin (MULTIVITAMIN) capsule Take 1 capsule by mouth daily.   Yes Historical Provider, MD  Vitamin D, Ergocalciferol, (DRISDOL) 50000 UNITS CAPS capsule Take 50,000 Units by mouth 2 (two) times a week.   Yes Historical Provider, MD  ciprofloxacin (CIPRO) 500 MG  tablet Take 1 tablet (500 mg total) by mouth 2 (two) times daily. Patient not taking: Reported on 07/06/2014 06/11/14   Gregor Hams, MD  sertraline (ZOLOFT) 100 MG tablet Take 1.5 tablets (150 mg total) by mouth daily. Patient not taking: Reported on 07/06/2014 06/01/14   Merian Capron, MD   BP 122/84 mmHg  Pulse 74  Temp(Src) 98.6 F (37 C) (Oral)  Resp 16  SpO2 100%  LMP 10/24/2013 Physical Exam  Constitutional: She is oriented to person, place, and time. She appears well-developed and well-nourished.  HENT:  Head: Normocephalic and atraumatic.  Nose: Nose normal.  Mouth/Throat: Oropharynx is clear and moist.  Eyes: Conjunctivae and EOM are normal. Pupils are equal, round, and reactive to light.  Neck: Normal range of motion. Neck supple. No JVD present. No tracheal deviation present. No thyromegaly present.  Cardiovascular: Normal rate, regular rhythm, normal heart sounds and intact distal pulses.  Exam reveals no gallop and no friction rub.   No murmur heard. Pulmonary/Chest: Effort normal and breath sounds normal. No stridor. No respiratory distress. She has no wheezes. She has no rales. She exhibits no tenderness.  Abdominal: Soft. Bowel sounds are normal. She exhibits no distension and no mass. There is no tenderness. There is no rebound and no guarding.  Musculoskeletal: Normal range of motion. She exhibits no edema or tenderness.  Lymphadenopathy:    She has no cervical adenopathy.  Neurological: She is alert and oriented to person, place, and time. She displays normal reflexes. She exhibits normal muscle tone. Coordination normal.  Skin: Skin is warm and dry. No rash noted. No erythema. No pallor.  Psychiatric: Her behavior is normal. Judgment and thought content normal.  Patient appears anxious with slight stutter to her speech  Nursing note and vitals reviewed.   ED Course  Procedures (including critical care time) Labs Review Labs Reviewed  CBC - Abnormal; Notable for  the following:    Hemoglobin 11.5 (*)    HCT 35.6 (*)    MCV 75.9 (*)    MCH 24.5 (*)    RDW 16.2 (*)    All other components within normal limits  BASIC METABOLIC PANEL - Abnormal; Notable for the following:    Sodium 134 (*)    All other components within normal limits  I-STAT TROPOININ, ED    Imaging Review Dg Chest 2 View  07/07/2014   CLINICAL DATA:  Upper mid chest pain for several hours. Shortness of breath. Nausea.  EXAM: CHEST  2 VIEW  COMPARISON:  03/18/2014  FINDINGS: The heart size and mediastinal contours are within normal limits. Both lungs are clear. The visualized skeletal structures are unremarkable.  IMPRESSION: No active cardiopulmonary disease.   Electronically Signed   By: Lucienne Capers M.D.   On: 07/07/2014 01:27     EKG  Interpretation   Date/Time:  Monday July 06 2014 23:51:46 EDT Ventricular Rate:  77 PR Interval:  154 QRS Duration: 82 QT Interval:  370 QTC Calculation: 419 R Axis:   68 Text Interpretation:  Sinus rhythm No significant change since last  tracing Confirmed by Arius Harnois  MD, Ajanay Farve ([id number]) on 07/07/2014 12:32:18 AM      MDM   Final diagnoses:  Chest pain, non-cardiac  Generalized anxiety disorder    39 year old female with chest pressure, constant since 6 PM.  EKG is normal.  Troponin is normal.  Chest x-ray is normal.  I suspect that symptoms are secondary to her ongoing anxiety issues.  Patient received Ativan and a GI cocktail to cover for any GI sources of her symptoms.  I feel that she is stable for follow-up with her primary care doctor and with her psychiatrist.    Linton Flemings, MD 07/07/14 [phone]

## 2014-07-07 NOTE — ED Notes (Signed)
Pt reports mid sternal cp since 1800 yesterday with SOB and lightheadedness.  Pt reports pain radiates under her L breast.  Pt describes pain as pressure.  Pt also reports L arm feeling "strange."  Reports heaviness and numb.  Pt also reports hx of panic attacks, has taken her meds without relief.

## 2014-07-07 NOTE — Telephone Encounter (Signed)
PT called for a refill for Cymbalta 30mg . Per Dr. De Nurse, pt is authorized for a refill for Cymbalta 30mg , Qty 60. Prescription was sent to [facility] ([place] Pt has a f/u appt on 7/12. Called and informed pt of prescription status. Pt verbalizes understanding.

## 2014-07-07 NOTE — Discharge Instructions (Signed)
Chest Pain Observation It is often hard to give a specific diagnosis for the cause of chest pain. Among other possibilities your symptoms might be caused by inadequate oxygen delivery to your heart (angina). Angina that is not treated or evaluated can lead to a heart attack (myocardial infarction) or death. Blood tests, electrocardiograms, and X-rays may have been done to help determine a possible cause of your chest pain. After evaluation and observation, your health care provider has determined that it is unlikely your pain was caused by an unstable condition that requires hospitalization. However, a full evaluation of your pain may need to be completed, with additional diagnostic testing as directed. It is very important to keep your follow-up appointments. Not keeping your follow-up appointments could result in permanent heart damage, disability, or death. If there is any problem keeping your follow-up appointments, you must call your health care provider. HOME CARE INSTRUCTIONS  Due to the slight chance that your pain could be angina, it is important to follow your health care provider's treatment plan and also maintain a healthy lifestyle:  Maintain or work toward achieving a healthy weight.  Stay physically active and exercise regularly.  Decrease your salt intake.  Eat a balanced, healthy diet. Talk to a dietitian to learn about heart-healthy foods.  Increase your fiber intake by including whole grains, vegetables, fruits, and nuts in your diet.  Avoid situations that cause stress, anger, or depression.  Take medicines as advised by your health care provider. Report any side effects to your health care provider. Do not stop medicines or adjust the dosages on your own.  Quit smoking. Do not use nicotine patches or gum until you check with your health care provider.  Keep your blood pressure, blood sugar, and cholesterol levels within normal limits.  Limit alcohol intake to no more than  1 drink per day for women who are not pregnant and 2 drinks per day for men.  Do not abuse drugs. SEEK IMMEDIATE MEDICAL CARE IF: You have severe chest pain or pressure which may include symptoms such as:  You feel pain or pressure in your arms, neck, jaw, or back.  You have severe back or abdominal pain, feel sick to your stomach (nauseous), or throw up (vomit).  You are sweating profusely.  You are having a fast or irregular heartbeat.  You feel short of breath while at rest.  You notice increasing shortness of breath during rest, sleep, or with activity.  You have chest pain that does not get better after rest or after taking your usual medicine.  You wake from sleep with chest pain.  You are unable to sleep because you cannot breathe.  You develop a frequent cough or you are coughing up blood.  You feel dizzy, faint, or experience extreme fatigue.  You develop severe weakness, dizziness, fainting, or chills. Any of these symptoms may represent a serious problem that is an emergency. Do not wait to see if the symptoms will go away. Call your local emergency services (911 in the [place] Do not drive yourself to the hospital. MAKE SURE YOU:  Understand these instructions.  Will watch your condition.  Will get help right away if you are not doing well or get worse. Document Released: 02/11/2010 Document Revised: 01/14/2013 Document Reviewed: 07/11/2012 [facility] Patient Information 2015 [place], [place]. This information is not intended to replace advice given to you by your health care provider. Make sure you discuss any questions you have with your health care provider.  Generalized Anxiety Disorder Generalized anxiety disorder (GAD) is a mental disorder. It interferes with life functions, including relationships, work, and school. GAD is different from normal anxiety, which everyone experiences at some point in their lives in response to specific life events and activities.  Normal anxiety actually helps [place] prepare for and get through these life events and activities. Normal anxiety goes away after the event or activity is over.  GAD causes anxiety that is not necessarily related to specific events or activities. It also causes excess anxiety in proportion to specific events or activities. The anxiety associated with GAD is also difficult to control. GAD can vary from mild to severe. People with severe GAD can have intense waves of anxiety with physical symptoms (panic attacks).  SYMPTOMS The anxiety and worry associated with GAD are difficult to control. This anxiety and worry are related to many life events and activities and also occur more days than not for 6 months or longer. People with GAD also have three or more of the following symptoms (one or more in children):  Restlessness.   Fatigue.  Difficulty concentrating.   Irritability.  Muscle tension.  Difficulty sleeping or unsatisfying sleep. DIAGNOSIS GAD is diagnosed through an assessment by your health care provider. Your health care provider will ask you questions aboutyour mood,physical symptoms, and events in your life. Your health care provider may ask you about your medical history and use of alcohol or drugs, including prescription medicines. Your health care provider may also do a physical exam and blood tests. Certain medical conditions and the use of certain substances can cause symptoms similar to those associated with GAD. Your health care provider may refer you to a mental health specialist for further evaluation. TREATMENT The following therapies are usually used to treat GAD:   Medication. Antidepressant medication usually is prescribed for long-term daily control. Antianxiety medicines may be added in severe cases, especially when panic attacks occur.   Talk therapy (psychotherapy). Certain types of talk therapy can be helpful in treating GAD by providing support, education, and  guidance. A form of talk therapy called cognitive behavioral therapy can teach you healthy ways to think about and react to daily life events and activities.  Stress managementtechniques. These include yoga, meditation, and exercise and can be very helpful when they are practiced regularly. A mental health specialist can help determine which treatment is best for you. Some people see improvement with one therapy. However, other people require a combination of therapies. Document Released: 05/06/2012 Document Revised: 05/26/2013 Document Reviewed: 05/06/2012 [facility] Patient Information 2015 [place], [place]. This information is not intended to replace advice given to you by your health care provider. Make sure you discuss any questions you have with your health care provider.

## 2014-07-08 ENCOUNTER — Other Ambulatory Visit (HOSPITAL_COMMUNITY): Payer: Federal, State, Local not specified - PPO | Admitting: Psychiatry

## 2014-07-08 DIAGNOSIS — F331 Major depressive disorder, recurrent, moderate: Secondary | ICD-10-CM | POA: Diagnosis not present

## 2014-07-08 NOTE — Progress Notes (Signed)
    Daily Group Progress Note  Program: IOP  Group Time: 9:00-10:30  Participation Level: Active  Behavioral Response: Appropriate  Type of Therapy:  Group Therapy  Summary of Progress: Pt. Presented as talkative, engaged, makes appropriate eye contact. Pt. Reported that she was able to work yesterday without having a panic attack.      Group Time: 10:30-12:00  Participation Level:  Active  Behavioral Response: Appropriate  Type of Therapy: Psycho-education Group  Summary of Progress: Pt. Discussed the necessity of healthy boundaries in family relationships.   Nancie Neas, LPC

## 2014-07-08 NOTE — Progress Notes (Signed)
    Daily Group Progress Note  Program: IOP  Group Time: 9:00-10:30  Participation Level: Active  Behavioral Response: Appropriate  Type of Therapy:  Group Therapy  Summary of Progress: Pt. Presented with brightened affect. Pt. Reported that she had a panic attack last night and went to the emergency room. Pt. Received education about the nature of panic attacks and techniques such as 4-3-8 breathing, meditation, and bilateral tapping that can help with management.      Group Time: 10:30-12:00  Participation Level:  Active  Behavioral Response: Appropriate  Type of Therapy: Psycho-education Group  Summary of Progress: Pt. Participated in guided meditation activity.   Nancie Neas, LPC

## 2014-07-09 ENCOUNTER — Other Ambulatory Visit (HOSPITAL_COMMUNITY): Payer: Federal, State, Local not specified - PPO | Admitting: Psychiatry

## 2014-07-09 DIAGNOSIS — F331 Major depressive disorder, recurrent, moderate: Secondary | ICD-10-CM | POA: Diagnosis not present
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
## 2014-07-10 ENCOUNTER — Other Ambulatory Visit (HOSPITAL_COMMUNITY): Payer: Federal, State, Local not specified - PPO | Admitting: Psychiatry

## 2014-07-10 DIAGNOSIS — F331 Major depressive disorder, recurrent, moderate: Secondary | ICD-10-CM | POA: Diagnosis not present

## 2014-07-10 DIAGNOSIS — F411 Generalized anxiety disorder: Secondary | ICD-10-CM
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
## 2014-07-13 ENCOUNTER — Other Ambulatory Visit (HOSPITAL_COMMUNITY): Payer: Federal, State, Local not specified - PPO | Admitting: Psychiatry

## 2014-07-13 DIAGNOSIS — F331 Major depressive disorder, recurrent, moderate: Secondary | ICD-10-CM | POA: Diagnosis not present
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
## 2014-07-14 ENCOUNTER — Other Ambulatory Visit (HOSPITAL_COMMUNITY): Payer: Federal, State, Local not specified - PPO | Attending: Psychiatry | Admitting: Psychiatry

## 2014-07-14 DIAGNOSIS — F411 Generalized anxiety disorder: Secondary | ICD-10-CM

## 2014-07-14 DIAGNOSIS — F331 Major depressive disorder, recurrent, moderate: Secondary | ICD-10-CM | POA: Diagnosis not present

## 2014-07-15 ENCOUNTER — Other Ambulatory Visit (HOSPITAL_COMMUNITY): Payer: Federal, State, Local not specified - PPO | Admitting: Psychiatry

## 2014-07-15 DIAGNOSIS — F411 Generalized anxiety disorder: Secondary | ICD-10-CM

## 2014-07-15 DIAGNOSIS — F331 Major depressive disorder, recurrent, moderate: Secondary | ICD-10-CM | POA: Diagnosis not present

## 2014-07-15 NOTE — Progress Notes (Signed)
    Daily Group Progress Note  Program: IOP  Group Time: 9:00-10:20  Participation Level: Active  Behavioral Response: Appropriate  Type of Therapy:  Group Therapy  Summary of Progress: Pt. Presented with bright affect, talkative. Pt. Reports that she continues to be anxious when anticipating interactions with her manager.      Group Time: 10:30-12:00  Participation Level:  Active  Behavioral Response: Appropriate  Type of Therapy: Psycho-education Group  Summary of Progress: Pt. Participated in vision board and goal setting activity.   Nancie Neas, LPC

## 2014-07-16 ENCOUNTER — Other Ambulatory Visit (HOSPITAL_COMMUNITY): Payer: Federal, State, Local not specified - PPO | Admitting: Psychiatry

## 2014-07-16 DIAGNOSIS — F331 Major depressive disorder, recurrent, moderate: Secondary | ICD-10-CM | POA: Diagnosis not present

## 2014-07-16 DIAGNOSIS — F411 Generalized anxiety disorder: Secondary | ICD-10-CM
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
## 2014-07-16 NOTE — Progress Notes (Signed)
Patient ID: Tiffany [name], female   DOB: 10-Apr-1975, 39 y.o.   MRN: [id number] D:  Pt informed writer that her therapist Tressia Miners [place], [facility]) is requesting an update on her progress.  A:  Faxed all group notes over to the therapist.  Informed pt.  R:  Pt receptive.

## 2014-07-17 ENCOUNTER — Other Ambulatory Visit (HOSPITAL_COMMUNITY): Payer: Federal, State, Local not specified - PPO | Admitting: Psychiatry

## 2014-07-17 DIAGNOSIS — F331 Major depressive disorder, recurrent, moderate: Secondary | ICD-10-CM | POA: Diagnosis not present

## 2014-07-17 DIAGNOSIS — F411 Generalized anxiety disorder: Secondary | ICD-10-CM

## 2014-07-17 NOTE — Progress Notes (Signed)
    Daily Group Progress Note  Program: IOP  Group Time: 9:00-10:30  Participation Level: Active  Behavioral Response: Appropriate  Type of Therapy:  Group Therapy  Summary of Progress: Pt. Presents as talkative and bright affect. Pt. Reports that she continues to see her therapist during outpatient program and has been using techniques to cope with anxiety such as bilateral tapping, 4-3-8 breathing, and meditation.      Group Time: 10:30-12:00  Participation Level:  Active  Behavioral Response: Appropriate  Type of Therapy: Psycho-education Group  Summary of Progress: Pt. Participated in discussion about the recognition of triggers of anxiety and depression.   Nancie Neas, LPC

## 2014-07-17 NOTE — Progress Notes (Signed)
Date: June 24,2016 Time: 11:00Am-12:00PM  Purpose: To address both internal and external anger, as well as anger prevention in practice. Intervention: Applied peer recovery principles to concepts of anger management preventative strategies, assertiveness (vs. aggressiveness or passivity), and the establishment of healthy boundaries. Effect: Tiffany [name] concluded that assertiveness is a "healthy outlet" for the anger she readily expresses during intense conflict.  Doreene Adas CPSS
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
## 2014-07-20 ENCOUNTER — Other Ambulatory Visit (HOSPITAL_COMMUNITY): Payer: Federal, State, Local not specified - PPO | Admitting: Psychiatry

## 2014-07-20 DIAGNOSIS — F331 Major depressive disorder, recurrent, moderate: Secondary | ICD-10-CM | POA: Diagnosis not present

## 2014-07-20 NOTE — Progress Notes (Signed)
    Daily Group Progress Note  Program: IOP  Group Time: 9:00-10:30  Participation Level: Active  Behavioral Response: Appropriate  Type of Therapy:  Psycho-education Group  Summary of Progress: Pt. Participated in medication education group with [place].     Group Time: 10:30-12:00  Participation Level:  Active  Behavioral Response: Appropriate  Type of Therapy: Group Therapy  Summary of Progress: Pt. Presented with bright affect, talkative. Pt. Processed recent communication regarding estranged father, anger about statements that he has made about her to other family members.   [facility]

## 2014-07-21 ENCOUNTER — Other Ambulatory Visit (HOSPITAL_COMMUNITY): Payer: Federal, State, Local not specified - PPO | Admitting: Psychiatry

## 2014-07-21 DIAGNOSIS — F411 Generalized anxiety disorder: Secondary | ICD-10-CM

## 2014-07-21 DIAGNOSIS — F331 Major depressive disorder, recurrent, moderate: Secondary | ICD-10-CM | POA: Diagnosis not present

## 2014-07-22 ENCOUNTER — Other Ambulatory Visit (HOSPITAL_COMMUNITY): Payer: Federal, State, Local not specified - PPO | Admitting: Psychiatry

## 2014-07-22 DIAGNOSIS — F331 Major depressive disorder, recurrent, moderate: Secondary | ICD-10-CM | POA: Diagnosis not present

## 2014-07-22 DIAGNOSIS — F411 Generalized anxiety disorder: Secondary | ICD-10-CM

## 2014-07-22 NOTE — Progress Notes (Signed)
    Daily Group Progress Note  Program: IOP Group Time: 9:00-10:30  Participation Level: Active  Behavioral Response: Appropriate  Type of Therapy:  Psycho-education Group  Summary of Progress: Pt. Watched and discussed Shawn Achor video about developing positive behaviors including journaling, meditation, physical exercise, random acts of kindness, and journaling gratitude.      Group Time: 10:30-12:00  Participation Level:  Active  Behavioral Response: Appropriate  Type of Therapy: Group Therapy  Summary of Progress: Pt. Participated in discharge and made bracelet representing her days in the program. Pt. Discussed changing her perspective on stressful situations to thinking about them as a a challenge.   Eloise Levels, [facility]

## 2014-07-22 NOTE — Progress Notes (Signed)
     This is a 39 yr old, married, employed, [place] American female, who was referred per therapist Clotilde Dieter, [facility]); treatment for ongoing depressive and anxiety symptoms. Symptoms included: Isolation, decreased appetite, restless sleep, decreased energy, poor concentration, irritability, lack of motivation, no energy, anhedonia, tearfulness, and panic attacks. Pt stated she has been anxious all her life but has been able to deal with it on her own. 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 States it is a very hostile environment. "Management bullies and create fear. Currently I am working part time. Been working from home since December 2016. I work four days out of the month in our office in [place]." She was out of work in Dec 2015 for a hysterectomy followed by complications which resulted in being out for 2 months. She reported that she was mistreated upon her return including having lost parts of her responsibilities which were not returned as well as being denied a workable computer to use when she worked at home. She went through the usual appeals process and eventually had to contact her congressman to be fairly treated. She felt bad about the way she was treated as well as the lengths she had to go to after pointing out the issues to her superiors. Her anxiety which she had been able to control got out of control to the point of repeated ED visits and being unable to return to work. At this point she is so uncomfortable working with her supervisors that panic attacks are very likely if she continues to work under her immediately superior, she believes. She says she has always been a good employee and this is all new to her.She does not want to quit her job but believes under the circumstances she needs reasonable accommodations which seem likely to be approved. 2) Strained finances: Reports that she hasn't gotten paid since March 2016. Pt completed MH-IOP  today.  Reports that her depression has lifted some.  States MH-IOP provided her with the structure she needed; while teaching her coping skills and techniques for panic attacks.  According to pt, she has been applying those skills.  Pt continues to c/o restless sleep.  "I have nightmares about the job."  According to pt, she is applying for other jobs.  Pt remains anxious about returning to work full-time.  A:  D/C today.  F/U with Clotilde Dieter, LPC on 07-23-14 @ 1 pm and Dr. De Nurse on 08-04-14 @ 2 pm.  Encouraged support groups.  Return to work on 07-23-14; without any restrictions.  R:  Pt receptive.                  Carlis Abbott, RITA, CNA, M.Ed

## 2014-07-22 NOTE — Progress Notes (Signed)
[facility] Discharge Summary  Niaomi Cartaya [id number]  Admission date: 07/02/2014 Discharge date: 07/22/2014  Reason for admission:  1.  Depression, major, recurrent, moderate  296.32  F33.1    2.  Generalized anxiety disorder  300.02  F41.1    History of Present Illness:  Ms Sanderlin says she has been anxious all her life but has been able to deal with it on her own.  She was out in Dec 2015 for a hysterectomy followed by complications which resulted in being out for 2 months.  She reported that she was mistreated upon her return including having lost parts of her responsibilities which were not returned as well as being denied a workable computer to use when she worked at home.  She went through the usual appeals process and eventually had to contact her congressman to be fairly treated.  She felt bad about the way she was treated as well as the lengths she had to go to after pointing out the issues to her superiors.  Her anxiety which she had been able to control got out of control to the point of repeated ED visits and being unable to return to work.  At this point she is so uncomfortable working with her supervisors that panic attacks are very likely if she continues to work under her immediately superior, she believes.  She says she has always been a good employee and this is all new to her.She does not want to quit her job but believes under the circumstances she needs certain accommodations which seem likely to be approved. Depression is currently present to the point of daily sadness, decreased energy, focus, interest and motivation.  She has decreased sleep disturbed by bad dreams.  Appetite is decreased but she has gained weight.  She has had suicidal thoughts but no plan or intent and at that point she and her therapist decided she needed mor intense intervention.  She has a good marriage and support system.     IOP COURSE: Ms Sieh attended  15 days of treatment in the [facility] intensive outpatient program and reports significant improvement in her depression and anxiety,although her anxiety is up a bit as she must return to her job at [employer] . She describes her job as the source of her anxiety and depression.Prior to admission she "couldnt get out of the bed".She is now going to begin walking.She will continue her medications from Dr Ulice Brilliant and her counseling.   MENTAL STATUS AT DISCHARGE General Appearance: Well Groomed  Eye Contact:  Good  Speech:  Clear and Coherent  Volume:  Normal  Mood:  Anxious BUT LESS THAN ON ADMISSION ;DEPRESSION RATES AT; 4 BOTH WERE 10 ON ADMISSION ANXIETY SLIGHTLY HIGHER DUE TO RETURN TO WORK Affect: Congruent  Thought Process:  Coherent and Logical  Orientation:  Full (Time, Place, and Person)  Thought Content:  Negative  Suicidal Thoughts:  No  Homicidal Thoughts:  No  Memory: Immediate;   Good  Recent;  Good Remote;   Good  Judgement:  Good  Insight:  Good  Psychomotor Activity:  Normal  Concentration:  Good  Recall: Good  Fund of Knowledge:Good  Language: Good  Akathisia:  Negative  Handed:  Right  AIMS (if indicated):  0  Assets:  Communication Skills  Desire for Improvement Financial Resources/Insurance Housing Intimacy Leisure Time Physical Health Resilience Social Support Talents/Skills Transportation Vocational/Educational  ADL's:  Intact  Cognition: WNL  Sleep: Improved  Chemical Use History: Substance Abuse History in the last 12 months:  No.  LABS:  CURRENT OUTPATIENT PRESCRIPTIONS Current Medications: Current Outpatient Prescriptions   Medication  Sig  Dispense  Refill   .  acetaminophen (TYLENOL) 325 MG tablet  Take 650 mg by mouth every 6 (six) hours as needed for mild pain.       Marland Kitchen  buPROPion (WELLBUTRIN XL) 150 MG 24 hr tablet  Take 1 tablet (150 mg total) by mouth every morning.  30 tablet  0   .  ciprofloxacin (CIPRO) 500 MG tablet  Take 1  tablet (500 mg total) by mouth 2 (two) times daily.  14 tablet  0   .  clobetasol ointment (TEMOVATE) 6.71 %  Apply 1 application topically 2 (two) times daily.        .  clonazePAM (KLONOPIN) 0.5 MG tablet  Take 1 tablet (0.5 mg total) by mouth daily as needed for anxiety.  30 tablet  0   .  DULoxetine (CYMBALTA) 30 MG capsule         .  ferrous sulfate 325 (65 FE) MG tablet  Take 325 mg by mouth 2 (two) times daily with a meal.       .  Multiple Vitamin (MULTIVITAMIN) capsule  Take 1 capsule by mouth daily.       .  sertraline (ZOLOFT) 100 MG tablet  Take 1.5 tablets (150 mg total) by mouth daily.  45 tablet  0       AXIS DIAGNOSIS1.  Depression, major, recurrent, moderate  296.32  F33.1    2.  Generalized anxiety disorder  300.02  F41.1      LEVEL OF CARE: Outpatient  DISCHARGE DESTINATION: Home  Is patient on multiple antipsychotic therapies?:No  Has patient failed 3 or more trials of antipsychotic monotherapy? No  Patient IWPYK:[phone] Patient address: [address] [place] [address]     FOLLOWUP RECOMMENDATIONS: RETURN TO DR AHKTAR;EXERCISE AS TOLERATED,FU WITH COUNSELOR TRACI COLLINS;RETURN PRN  Dara Hoyer PA 07/22/2014

## 2014-07-22 NOTE — Patient Instructions (Addendum)
Patient completed MH-IOP today.  Will follow up with Clotilde Dieter, LPC on 07-24-14 @ 9 a.m and Dr. De Nurse on 08-04-14 @ 2 pm.  Encouraged support groups.

## 2014-07-22 NOTE — Progress Notes (Signed)
    Daily Group Progress Note  Program: IOP  Group Time: 9:00-10:30  Participation Level: Active  Behavioral Response: Appropriate  Type of Therapy:  Group Therapy  Summary of Progress: Pt. Presented with bright affect, does not present as anxious or depressed. Pt. Continues to report that her work is her most significant stressor and worries about return to work. Pt. Uses stress management tools and discussed long-term goal of finding another job.      Group Time: 10:30-12:00  Participation Level:  Active  Behavioral Response: Appropriate  Type of Therapy: Psycho-education Group  Summary of Progress: Pt. Watched Amy Cuddy video and discussed the use of power poses and how shifting physical posture can be used to increase self-confidence and decrease stress hormone.   Nancie Neas, LPC

## 2014-07-23 ENCOUNTER — Other Ambulatory Visit (HOSPITAL_COMMUNITY): Payer: Federal, State, Local not specified - PPO

## 2014-08-04 ENCOUNTER — Ambulatory Visit (HOSPITAL_COMMUNITY): Payer: Self-pay | Admitting: Psychiatry

## 2014-08-04 ENCOUNTER — Encounter (HOSPITAL_COMMUNITY): Payer: Self-pay | Admitting: Psychiatry

## 2014-08-04 ENCOUNTER — Ambulatory Visit (INDEPENDENT_AMBULATORY_CARE_PROVIDER_SITE_OTHER): Payer: Federal, State, Local not specified - PPO | Admitting: Psychiatry

## 2014-08-04 VITALS — BP 136/78 | HR 89 | Ht 67.0 in | Wt 236.0 lb

## 2014-08-04 DIAGNOSIS — F410 Panic disorder [episodic paroxysmal anxiety] without agoraphobia: Secondary | ICD-10-CM | POA: Diagnosis not present

## 2014-08-04 DIAGNOSIS — F430 Acute stress reaction: Secondary | ICD-10-CM | POA: Diagnosis not present

## 2014-08-04 DIAGNOSIS — F411 Generalized anxiety disorder: Secondary | ICD-10-CM | POA: Diagnosis not present

## 2014-08-04 DIAGNOSIS — F331 Major depressive disorder, recurrent, moderate: Secondary | ICD-10-CM | POA: Diagnosis not present

## 2014-08-04 DIAGNOSIS — F4311 Post-traumatic stress disorder, acute: Secondary | ICD-10-CM

## 2014-08-04 NOTE — Progress Notes (Signed)
Patient ID: Tiffany [name], female   DOB: 1975-12-04, 39 y.o.   MRN: [id number]  [facility] Follow up visit  Tiffany [name] [id number] 39 y.o.  08/04/2014 2:25 PM  Chief Complaint:  Depression, anxiety stress related to work.   History of Present Illness:   Patient Presents for  Early follow up and medication management for Derpession and anxiety.  Acute stress disorder  Patient is 39 years old currently married African-American female. She is living with her husband and 4 kids. They have recently moved from [place] she works with [employer] for the last 20 years. Initially presented with the following "Says that she has been on different medications starting couple of years ago when she was having stress related to work she started having panic attacks when her manager giving her a hard time. She also had gone to ED and rule out any cardiac problems and heart back since she was having chest pain palpitations as if she is going to choke or die. After that she was diagnosed with generalized anxiety disorder and follows up with her primary care physician. She did change her job despite that she still has had anxiety. Says that Wellbutrin helped her depression but it did not help her anxiety. She's been on different medications in the past including Paxil that made her body warm. Lexapro made her sleepy Zoloft made her cry."  She has followed with Dr. Donnelly [name] and has done IOP program for anxiety, stress disorder. She has completed the program and is now part of 3 times therapy a week with counsellor. She is working thru [employer] with the office in [place]. Still has anxiety and has to take klonopine half tablet of 67m . She feels and her husband acknowledges that she still gets a harsh time with her and it upsets her and has panic attack.  She has a written note in regard to her work and accommodations to work by her therapist. I have written similar like letters  before for reasonable accommodations. Husband says they get back to them and want more or different in wording or at time snot satisied and this has been a run around and it effects the patient.  Patient wanted me to write a letter. I explained i have written it before and chart copies would have her current medications list.  She is not taking klonopine 135mtid but only half tablet during the day once. As explained if she takes three times a day it would cause sedation and she should avoid driving on these medications as it can cause sedation.  Stress disorder ongoing related to apprehension of how she will continue to work on job stress.  Apparently she tried to get a local job but was not able to because of not good references from old job. [facility] as it is near her residence. Also she has started feeling more comfortable there.  She is  seeing a therapist Tiffany [name].  Her condition would need ongoing therapy and to how to deal with stress and medications. She does have refill. Depression/anxiety' more so related to work stress. continous apprehension.  She also takes klonopine prn but not regularly   Worry and anxiety related to going back to work and panic apprehension is her main stressor.  Severity: 4/10 .  10 being no depression. Anxiety ;7/10 . 10 being extreme anxiety Context: job stress, worried about  her job Modifying factors: supportive husband and family. Does not endorse hopelessness suicidal or homicidal thoughts. Does not endorse delusions or hallucinations, there is no psychotic symptoms. There is no current manic symptoms over the past does not endorse any paranoia.     Hospitalization for psychiatric illness: No History of Electroconvulsive Shock Therapy: No Prior Suicide Attempts: No  Medical History; Past Medical History  Diagnosis Date  . Anxiety   . Depression   . Umbilical hernia    . Anemia     Allergies: Allergies  Allergen Reactions  . Mango Flavor Anaphylaxis and Swelling  . Amoxicillin Hives and Swelling  . Penicillins Itching and Swelling    Medications: Outpatient Encounter Prescriptions as of 08/04/2014  Medication Sig  . acetaminophen (TYLENOL) 325 MG tablet Take 650 mg by mouth every 6 (six) hours as needed for mild pain.  Marland Kitchen buPROPion (WELLBUTRIN XL) 150 MG 24 hr tablet Take 1 tablet (150 mg total) by mouth every morning. (Patient taking differently: Take 150 mg by mouth 2 (two) times daily. )  . clobetasol ointment (TEMOVATE) 2.56 % Apply 1 application topically daily.   . clonazePAM (KLONOPIN) 1 MG tablet Take 1 tablet (1 mg total) by mouth 3 (three) times daily.  . DULoxetine (CYMBALTA) 30 MG capsule Take 1 capsule (30 mg total) by mouth 2 (two) times daily.  . ferrous sulfate 325 (65 FE) MG tablet Take 325 mg by mouth 2 (two) times daily with a meal.  . Multiple Vitamin (MULTIVITAMIN) capsule Take 1 capsule by mouth daily.  . Vitamin D, Ergocalciferol, (DRISDOL) 50000 UNITS CAPS capsule Take 50,000 Units by mouth 2 (two) times a week.  . ciprofloxacin (CIPRO) 500 MG tablet Take 1 tablet (500 mg total) by mouth 2 (two) times daily. (Patient not taking: Reported on 07/06/2014)  . [DISCONTINUED] sertraline (ZOLOFT) 100 MG tablet Take 1.5 tablets (150 mg total) by mouth daily. (Patient not taking: Reported on 07/06/2014)   No facility-administered encounter medications on file as of 08/04/2014.    Family History; Family History  Problem Relation Age of Onset  . Heart disease Mother 20    heart attack  . Depression Mother   . Hypertension Father 53  . Drug abuse Father   . Depression Maternal Aunt   . Anxiety disorder Maternal Aunt   . Anxiety disorder Paternal Grandmother       Labs:  Recent Results (from the past 2160 hour(s))  Urinalysis, Routine w reflex microscopic     Status: Abnormal   Collection Time: 05/10/14  6:44 PM  Result Value  Ref Range   Color, Urine YELLOW YELLOW   APPearance CLOUDY (A) CLEAR   Specific Gravity, Urine 1.019 1.005 - 1.030   pH 8.0 5.0 - 8.0   Glucose, UA NEGATIVE NEGATIVE mg/dL   Hgb urine dipstick NEGATIVE NEGATIVE   Bilirubin Urine NEGATIVE NEGATIVE   Ketones, ur NEGATIVE NEGATIVE mg/dL   Protein, ur NEGATIVE NEGATIVE mg/dL   Urobilinogen, UA 0.2 0.0 - 1.0 mg/dL   Nitrite NEGATIVE NEGATIVE   Leukocytes, UA NEGATIVE NEGATIVE    Comment: MICROSCOPIC NOT DONE ON URINES WITH NEGATIVE PROTEIN, BLOOD, LEUKOCYTES, NITRITE, OR GLUCOSE <1000 mg/dL.  CBC with Differential     Status: Abnormal   Collection Time: 05/10/14  6:51 PM  Result Value Ref Range   WBC 8.7 4.0 - 10.5 K/uL   RBC 4.61 3.87 - 5.11 MIL/uL   Hemoglobin 11.1 (L) 12.0 - 15.0 g/dL   HCT 34.6 (L) 36.0 -  46.0 %   MCV 75.1 (L) 78.0 - 100.0 fL   MCH 24.1 (L) 26.0 - 34.0 pg   MCHC 32.1 30.0 - 36.0 g/dL   RDW 16.6 (H) 11.5 - 15.5 %   Platelets 369 150 - 400 K/uL   Neutrophils Relative % 62 43 - 77 %   Neutro Abs 5.4 1.7 - 7.7 K/uL   Lymphocytes Relative 25 12 - 46 %   Lymphs Abs 2.1 0.7 - 4.0 K/uL   Monocytes Relative 11 3 - 12 %   Monocytes Absolute 1.0 0.1 - 1.0 K/uL   Eosinophils Relative 1 0 - 5 %   Eosinophils Absolute 0.1 0.0 - 0.7 K/uL   Basophils Relative 1 0 - 1 %   Basophils Absolute 0.1 0.0 - 0.1 K/uL  Comprehensive metabolic panel     Status: Abnormal   Collection Time: 05/10/14  6:51 PM  Result Value Ref Range   Sodium 138 135 - 145 mmol/L   Potassium 3.9 3.5 - 5.1 mmol/L   Chloride 104 96 - 112 mmol/L   CO2 25 19 - 32 mmol/L   Glucose, Bld 89 70 - 99 mg/dL   BUN 6 6 - 23 mg/dL   Creatinine, Ser 0.91 0.50 - 1.10 mg/dL   Calcium 8.8 8.4 - 10.5 mg/dL   Total Protein 7.1 6.0 - 8.3 g/dL   Albumin 3.7 3.5 - 5.2 g/dL   AST 18 0 - 37 U/L   ALT 15 0 - 35 U/L   Alkaline Phosphatase 66 39 - 117 U/L   Total Bilirubin 0.4 0.3 - 1.2 mg/dL   GFR calc non Af Amer 78 (L) >90 mL/min   GFR calc Af Amer >90 >90 mL/min     Comment: (NOTE) The eGFR has been calculated using the CKD EPI equation. This calculation has not been validated in all clinical situations. eGFR's persistently <90 mL/min signify possible Chronic Kidney Disease.    Anion gap 9 5 - 15  Lipase, blood     Status: None   Collection Time: 05/10/14  6:51 PM  Result Value Ref Range   Lipase 27 11 - 59 U/L  I-Stat Troponin, ED (not at [facility])     Status: None   Collection Time: 05/10/14  8:28 PM  Result Value Ref Range   Troponin i, poc 0.00 0.00 - 0.08 ng/mL   Comment 3            Comment: Due to the release kinetics of cTnI, a negative result within the first hours of the onset of symptoms does not rule out myocardial infarction with certainty. If myocardial infarction is still suspected, repeat the test at appropriate intervals.   POCT urinalysis dip (device)     Status: None   Collection Time: 06/11/14  8:02 PM  Result Value Ref Range   Glucose, UA NEGATIVE NEGATIVE mg/dL   Bilirubin Urine NEGATIVE NEGATIVE   Ketones, ur NEGATIVE NEGATIVE mg/dL   Specific Gravity, Urine 1.015 1.005 - 1.030   Hgb urine dipstick NEGATIVE NEGATIVE   pH 6.5 5.0 - 8.0   Protein, ur NEGATIVE NEGATIVE mg/dL   Urobilinogen, UA 0.2 0.0 - 1.0 mg/dL   Nitrite NEGATIVE NEGATIVE   Leukocytes, UA NEGATIVE NEGATIVE    Comment: Biochemical Testing Only. Please order routine urinalysis from main lab if confirmatory testing is needed.  Pregnancy, urine POC     Status: None   Collection Time: 06/11/14  8:03 PM  Result Value Ref Range  Preg Test, Ur NEGATIVE NEGATIVE    Comment:        THE SENSITIVITY OF THIS METHODOLOGY IS >24 mIU/mL   Urine culture     Status: None   Collection Time: 06/11/14  8:11 PM  Result Value Ref Range   Specimen Description URINE, CLEAN CATCH    Special Requests Normal    Colony Count      >=100,000 COLONIES/ML Performed at [facility]    Culture      Multiple bacterial morphotypes present, none predominant. Suggest  appropriate recollection if clinically indicated. Performed at [facility]    Report Status 06/13/2014 FINAL   CBC     Status: Abnormal   Collection Time: 07/07/14 12:16 AM  Result Value Ref Range   WBC 10.0 4.0 - 10.5 K/uL   RBC 4.69 3.87 - 5.11 MIL/uL   Hemoglobin 11.5 (L) 12.0 - 15.0 g/dL   HCT 35.6 (L) 36.0 - 46.0 %   MCV 75.9 (L) 78.0 - 100.0 fL   MCH 24.5 (L) 26.0 - 34.0 pg   MCHC 32.3 30.0 - 36.0 g/dL   RDW 16.2 (H) 11.5 - 15.5 %   Platelets 357 150 - 400 K/uL  Basic metabolic panel     Status: Abnormal   Collection Time: 07/07/14 12:16 AM  Result Value Ref Range   Sodium 134 (L) 135 - 145 mmol/L   Potassium 3.8 3.5 - 5.1 mmol/L   Chloride 103 101 - 111 mmol/L   CO2 25 22 - 32 mmol/L   Glucose, Bld 92 65 - 99 mg/dL   BUN 12 6 - 20 mg/dL   Creatinine, Ser 0.94 0.44 - 1.00 mg/dL   Calcium 9.1 8.9 - 10.3 mg/dL   GFR calc non Af Amer >60 >60 mL/min   GFR calc Af Amer >60 >60 mL/min    Comment: (NOTE) The eGFR has been calculated using the CKD EPI equation. This calculation has not been validated in all clinical situations. eGFR's persistently <60 mL/min signify possible Chronic Kidney Disease.    Anion gap 6 5 - 15  I-stat troponin, ED  (not at [facility])     Status: None   Collection Time: 07/07/14 12:22 AM  Result Value Ref Range   Troponin i, poc 0.00 0.00 - 0.08 ng/mL   Comment 3            Comment: Due to the release kinetics of cTnI, a negative result within the first hours of the onset of symptoms does not rule out myocardial infarction with certainty. If myocardial infarction is still suspected, repeat the test at appropriate intervals.        Musculoskeletal: Strength & Muscle Tone: within normal limits Gait & Station: normal Patient leans: N/A  Mental Status Examination;   Psychiatric Specialty Exam: Physical Exam  Constitutional: She appears well-developed and well-nourished. No distress.  HENT:  Head: Normocephalic and  atraumatic.  Skin: She is not diaphoretic.    Review of Systems  Constitutional: Negative for fever.  Gastrointestinal: Negative for nausea.  Skin: Negative for rash.  Neurological: Negative for tremors.  Psychiatric/Behavioral: Positive for depression. The patient is nervous/anxious.     Blood pressure 136/78, pulse 89, height _0  (1.702 m), weight 236 lb (107.049 kg), last menstrual period 10/24/2013, SpO2 98 %.Body mass index is 36.95 kg/(m^2).  General Appearance: Casual  Eye Contact::  Fair  Speech:  Normal Rate  Volume:  Normal  Mood:  Dysphoric and anxious  Affect:  Congruent  Thought Process:  Coherent  Orientation:  Full (Time, Place, and Person)  Thought Content:  Rumination  Suicidal Thoughts:  No  Homicidal Thoughts:  No  Memory:  Immediate;   Fair Recent;   Fair  Judgement:  Fair  Insight:  Fair  Psychomotor Activity:  Normal  Concentration:  Fair  Recall:  Fair  Akathisia:  Negative  Handed:  Right  AIMS (if indicated):     Assets:  Communication Skills Desire for Improvement Financial Resources/Insurance Housing Intimacy  Sleep:        Assessment: Axis I: Major depressive disorder recurrent moderate. Generalized anxiety disorder. Panic disorder. Acute stress disorder  Axis II: Deferred  Axis III:  Past Medical History  Diagnosis Date  . Anxiety   . Depression   . Umbilical hernia   . Anemia     Axis IV: Psychosocial recently moved from [place]. Job stress. Recent hysterectomy   Treatment Plan and Summary:  Continue cymbalta 9m bid , she has medication. Can take regular  Or prn klonopine 0,559mqd for anxiety.   She would benefit from continuation of IOP.  Continue therapy IOP  in regard to stress and how to cope with related to work if she starts.  Increase daily acitivities and continue therapy with her counsellor.   Medication Side effects, benefits and risks reviewed/discussed with Patient. Time given for patient to respond and asks  questions regarding the Diagnosis and Medications. Safety concerns and to report to ER if suicidal or call 911. Relevant Medications refilled or called in to pharmacy.  Follow up with Primary care provider in regards to Medical conditions. Recommend compliance with medications and follow up office appointments. Discussed to avail opportunity to consider or/and continue Individual therapy with Counselor. Greater than 50% of time was spend in counseling and coordination of care with the patient.  Schedule for Follow up visit in 3 weeks or call in earlier as necessary. [facility] and [facility]. She has done IOP there and would like to follow with providers at that office.  Says she was told to come to this office one more time before she could be considered for transfer.   Husband was supportive and acknowledged help provided by this office.     AKMerian CapronMD 08/04/2014

## 2014-08-05 ENCOUNTER — Encounter (HOSPITAL_COMMUNITY): Payer: Self-pay | Admitting: Emergency Medicine

## 2014-08-05 ENCOUNTER — Telehealth (HOSPITAL_COMMUNITY): Payer: Self-pay

## 2014-08-05 ENCOUNTER — Emergency Department (HOSPITAL_COMMUNITY)
Admission: EM | Admit: 2014-08-05 | Discharge: 2014-08-05 | Disposition: A | Discharge status: Home / Self Care | Payer: Federal, State, Local not specified - PPO | Attending: Emergency Medicine | Admitting: Emergency Medicine

## 2014-08-05 DIAGNOSIS — Y998 Other external cause status: Secondary | ICD-10-CM | POA: Diagnosis not present

## 2014-08-05 DIAGNOSIS — R079 Chest pain, unspecified: Secondary | ICD-10-CM | POA: Insufficient documentation

## 2014-08-05 DIAGNOSIS — R42 Dizziness and giddiness: Secondary | ICD-10-CM | POA: Diagnosis present

## 2014-08-05 DIAGNOSIS — F32A Depression, unspecified: Secondary | ICD-10-CM

## 2014-08-05 DIAGNOSIS — Z8719 Personal history of other diseases of the digestive system: Secondary | ICD-10-CM | POA: Diagnosis not present

## 2014-08-05 DIAGNOSIS — Y9389 Activity, other specified: Secondary | ICD-10-CM | POA: Insufficient documentation

## 2014-08-05 DIAGNOSIS — Y9289 Other specified places as the place of occurrence of the external cause: Secondary | ICD-10-CM | POA: Diagnosis not present

## 2014-08-05 DIAGNOSIS — F329 Major depressive disorder, single episode, unspecified: Secondary | ICD-10-CM | POA: Diagnosis not present

## 2014-08-05 DIAGNOSIS — T424X2A Poisoning by benzodiazepines, intentional self-harm, initial encounter: Secondary | ICD-10-CM | POA: Insufficient documentation

## 2014-08-05 DIAGNOSIS — Z880 Allergy status to penicillin: Secondary | ICD-10-CM | POA: Insufficient documentation

## 2014-08-05 DIAGNOSIS — T438X2A Poisoning by other psychotropic drugs, intentional self-harm, initial encounter: Secondary | ICD-10-CM | POA: Insufficient documentation

## 2014-08-05 DIAGNOSIS — D649 Anemia, unspecified: Secondary | ICD-10-CM | POA: Insufficient documentation

## 2014-08-05 DIAGNOSIS — F410 Panic disorder [episodic paroxysmal anxiety] without agoraphobia: Secondary | ICD-10-CM | POA: Diagnosis present

## 2014-08-05 DIAGNOSIS — F411 Generalized anxiety disorder: Secondary | ICD-10-CM

## 2014-08-05 DIAGNOSIS — F13129 Sedative, hypnotic or anxiolytic abuse with intoxication, unspecified: Secondary | ICD-10-CM | POA: Diagnosis not present

## 2014-08-05 DIAGNOSIS — T50904A Poisoning by unspecified drugs, medicaments and biological substances, undetermined, initial encounter: Secondary | ICD-10-CM

## 2014-08-05 DIAGNOSIS — F419 Anxiety disorder, unspecified: Secondary | ICD-10-CM | POA: Diagnosis not present

## 2014-08-05 DIAGNOSIS — Z79899 Other long term (current) drug therapy: Secondary | ICD-10-CM | POA: Diagnosis not present

## 2014-08-05 LAB — URINALYSIS, ROUTINE W REFLEX MICROSCOPIC
Bilirubin Urine: NEGATIVE
Glucose, UA: NEGATIVE mg/dL
Hgb urine dipstick: NEGATIVE
KETONES UR: NEGATIVE mg/dL
LEUKOCYTES UA: NEGATIVE
Nitrite: NEGATIVE
Protein, ur: NEGATIVE mg/dL
Specific Gravity, Urine: 1.014 (ref 1.005–1.030)
Urobilinogen, UA: 0.2 mg/dL (ref 0.0–1.0)
pH: 5.5 (ref 5.0–8.0)

## 2014-08-05 LAB — RAPID URINE DRUG SCREEN, HOSP PERFORMED
AMPHETAMINES: NOT DETECTED
BENZODIAZEPINES: POSITIVE — AB
Barbiturates: NOT DETECTED
Cocaine: NOT DETECTED
Opiates: NOT DETECTED
Tetrahydrocannabinol: NOT DETECTED

## 2014-08-05 LAB — BASIC METABOLIC PANEL
Anion gap: 9 (ref 5–15)
BUN: 8 mg/dL (ref 6–20)
CALCIUM: 9 mg/dL (ref 8.9–10.3)
CHLORIDE: 104 mmol/L (ref 101–111)
CO2: 24 mmol/L (ref 22–32)
CREATININE: 1.06 mg/dL — AB (ref 0.44–1.00)
GFR calc non Af Amer: >60 mL/min (ref >60)
Glucose, Bld: 89 mg/dL (ref 65–99)
Potassium: 3.8 mmol/L (ref 3.5–5.1)
Sodium: 137 mmol/L (ref 135–145)

## 2014-08-05 LAB — CBC
HEMATOCRIT: 36.3 % (ref 36.0–46.0)
Hemoglobin: 11.6 g/dL — ABNORMAL LOW (ref 12.0–15.0)
MCH: 24.4 pg — AB (ref 26.0–34.0)
MCHC: 32 g/dL (ref 30.0–36.0)
MCV: 76.3 fL — ABNORMAL LOW (ref 78.0–100.0)
PLATELETS: 330 10*3/uL (ref 150–400)
RBC: 4.76 MIL/uL (ref 3.87–5.11)
RDW: 16.6 % — ABNORMAL HIGH (ref 11.5–15.5)
WBC: 8.2 10*3/uL (ref 4.0–10.5)

## 2014-08-05 LAB — SALICYLATE LEVEL

## 2014-08-05 LAB — ETHANOL

## 2014-08-05 LAB — ACETAMINOPHEN LEVEL

## 2014-08-05 MED ORDER — SODIUM CHLORIDE 0.9 % IV BOLUS (SEPSIS)
1000.0000 mL | Freq: Once | INTRAVENOUS | Status: AC
  Administered 2014-08-05: 1000 mL via INTRAVENOUS

## 2014-08-05 NOTE — ED Notes (Signed)
Took medication because something upset her at work. Took additional 2 tabs 30mg  Cymbalta and 4 tabs 1mg  Klonopin for a total today of 120mg  Cymbalta and 6mg  Klonopin. Stats feels general weakness and dizzy. Denies pain 0/10.

## 2014-08-05 NOTE — ED Notes (Signed)
Called Staffing office to request a sitter. Spoke with Baker Janus.

## 2014-08-05 NOTE — Discharge Instructions (Signed)
°Depression °Depression refers to feeling sad, low, down in the dumps, blue, gloomy, or empty. In general, there are two kinds of depression: °1. Normal sadness or normal grief. This kind of depression is one that we all feel from time to time after upsetting life experiences, such as the loss of a job or the ending of a relationship. This kind of depression is considered normal, is short lived, and resolves within a few days to 2 weeks. Depression experienced after the loss of a loved one (bereavement) often lasts longer than 2 weeks but normally gets better with time. °2. Clinical depression. This kind of depression lasts longer than normal sadness or normal grief or interferes with your ability to function at home, at work, and in school. It also interferes with your personal relationships. It affects almost every aspect of your life. Clinical depression is an illness. °Symptoms of depression can also be caused by conditions other than those mentioned above, such as: °· Physical illness. Some physical illnesses, including underactive thyroid gland (hypothyroidism), severe anemia, specific types of cancer, diabetes, uncontrolled seizures, heart and lung problems, strokes, and chronic pain are commonly associated with symptoms of depression. °· Side effects of some prescription medicine. In some people, certain types of medicine can cause symptoms of depression. °· Substance abuse. Abuse of alcohol and illicit drugs can cause symptoms of depression. °SYMPTOMS °Symptoms of normal sadness and normal grief include the following: °· Feeling sad or crying for short periods of time. °· Not caring about anything (apathy). °· Difficulty sleeping or sleeping too much. °· No longer able to enjoy the things you used to enjoy. °· Desire to be by oneself all the time (social isolation). °· Lack of energy or motivation. °· Difficulty concentrating or remembering. °· Change in appetite or weight. °· Restlessness or  agitation. °Symptoms of clinical depression include the same symptoms of normal sadness or normal grief and also the following symptoms: °· Feeling sad or crying all the time. °· Feelings of guilt or worthlessness. °· Feelings of hopelessness or helplessness. °· Thoughts of suicide or the desire to harm yourself (suicidal ideation). °· Loss of touch with reality (psychotic symptoms). Seeing or hearing things that are not real (hallucinations) or having false beliefs about your life or the people around you (delusions and paranoia). °DIAGNOSIS  °The diagnosis of clinical depression is usually based on how bad the symptoms are and how long they have lasted. Your health care provider will also ask you questions about your medical history and substance use to find out if physical illness, use of prescription medicine, or substance abuse is causing your depression. Your health care provider may also order blood tests. °TREATMENT  °Often, normal sadness and normal grief do not require treatment. However, sometimes antidepressant medicine is given for bereavement to ease the depressive symptoms until they resolve. °The treatment for clinical depression depends on how bad the symptoms are but often includes antidepressant medicine, counseling with a mental health professional, or both. Your health care provider will help to determine what treatment is best for you. °Depression caused by physical illness usually goes away with appropriate medical treatment of the illness. If prescription medicine is causing depression, talk with your health care provider about stopping the medicine, decreasing the dose, or changing to another medicine. °Depression caused by the abuse of alcohol or illicit drugs goes away when you stop using these substances. Some adults need professional help in order to stop drinking or using drugs. °SEEK IMMEDIATE MEDICAL   CARE IF: °· You have thoughts about hurting yourself or others. °· You lose touch  with reality (have psychotic symptoms). °· You are taking medicine for depression and have a serious side effect. °FOR MORE INFORMATION °· National Alliance on Mental Illness: www.nami.org  °· National Institute of Mental Health: www.nimh.nih.gov  °Document Released: 01/07/2000 Document Revised: 05/26/2013 Document Reviewed: 04/10/2011 °ExitCare® Patient Information ©2015 [employer]. This information is not intended to replace advice given to you by your health care provider. Make sure you discuss any questions you have with your health care provider. °  Emergency Department Resource Guide °1) Find a Doctor and Pay Out of Pocket °Although you won't have to find out who is covered by your insurance plan, it is a good idea to ask around and get recommendations. You will then need to call the office and see if the doctor you have chosen will accept you as a new patient and what types of options they offer for patients who are self-pay. Some doctors offer discounts or will set up payment plans for their patients who do not have insurance, but you will need to ask so you aren't surprised when you get to your appointment. ° °2) Contact Your Local Health Department °Not all health departments have doctors that can see patients for sick visits, but many do, so it is worth a call to see if yours does. If you don't know where your local health department is, you can check in your phone book. The CDC also has a tool to help you locate your state's health department, and many state websites also have listings of all of their local health departments. ° °3) Find a Walk-in Clinic °If your illness is not likely to be very severe or complicated, you may want to try a walk in clinic. These are popping up all over the country in pharmacies, drugstores, and shopping centers. They're usually staffed by nurse practitioners or physician assistants that have been trained to treat common illnesses and complaints. They're usually fairly  quick and inexpensive. However, if you have serious medical issues or chronic medical problems, these are probably not your best option. ° °No Primary Care Doctor: °- Call [facility] at  832-8000 - they can help you locate a primary care doctor that  accepts your insurance, provides certain services, etc. °- Physician Referral Service- 1-800-533-3463 ° °Chronic Pain Problems: °Organization         Address  Phone   Notes  °[facility]  (336) 297-2271 Patients need to be referred by their primary care doctor.  ° °Medication Assistance: °Organization         Address  Phone   Notes  °[place] [facility] [address] °[place], [place] [address] (336) 641-8030 --Must be a resident of [place] °-- Must have NO insurance coverage whatsoever (no Medicaid/ Medicare, etc.) °-- The pt. MUST have a primary care doctor that directs their care regularly and follows them in the community °  °[facility]  (866) 331-1348   °[employer]  (888) 892-1162   ° °Agencies that provide inexpensive medical care: °Organization         Address  Phone   Notes  °[facility]  (336) 832-8035   °[facility]    (336) 832-7272   °[facility] [address] °[place], [place] [address] (336) 832-4777   °[facility] [place] [address], °[place] (336) 271-4999   °  [facility]    (336) 373-0678   °[facility]    (336) 272-1050   °[facility] ° [address], [place] Phone:  (336) 832-4444, Fax:  (336) 832-4440 Hours of Operation:  9 am - 6 pm, M-F.  Also accepts Medicaid/Medicare and self-pay.  °[facility] ° [address], [place] Phone: (336) 832-3150, Fax: (336) 832-3151. Hours of Operation:  8:30 am - 5:30 pm, M-F.  Also accepts Medicaid and self-pay.  °[facility] [place] [address], [place] Phone: (336) 878-6027    °[facility] [address], [place], [place] (336)723-1848, Ext. 123 Mondays & Thursdays: 7-9 AM.  First 15 patients are seen on a first come, first serve basis. °  ° °Medicaid-accepting [place] Providers: ° °Organization         Address  Phone   Notes  °[facility] [address], [place] (336) 641-2100 Also accepts self-pay patients.  °[facility] [address], [place] ° (336) 856-9996   °[facility] [address], [place] (336) 288-8857   °[facility] [address] [place] [address], [place] (336) 299-7000   °Veita Bland [address], [place]  ° (336) 373-1557 Only accepts [place] Medicaid patients after they have their name applied to their card.  ° °Self-Pay (no insurance) in [place]: ° °Organization         Address  Phone   Notes  °Sickle Cell Patients, [facility] [address], [place] (336) 832-1970   °[facility] [address], [place] (336) 832-4400   °[facility] ° [address], [place] (336) 992-4800   °[facility]/Dr. Osei-Bonsu ° [address] [place] [address], [place] or [address], [place] (336) 841-8500 Phone number for both [place] and [place] locations is the same.  °[facility] [address], [place] (336) 299-0000   °[facility] [address] [place] [address],  or [address] (336) 852-7530 °(336) 878-2260   °[facility] [address],  (336) 350-1642, phone; (336) 294-5005, fax Sees patients 1st and 3rd Saturday of every month.  Must not qualify for public or private insurance (i.e. Medicaid, Medicare, [place] Health Choice, Veterans' Benefits) • Household income should be no more than 200% of the poverty level •The clinic cannot treat you if you are  pregnant or think you are pregnant • Sexually transmitted diseases are not treated at the clinic.  ° °Dental Care: °Organization         Address  Phone  Notes  °[facility] [address],  (336) 641-6152 Accepts children up to age 21 who are enrolled in Medicaid or [place] Health Choice; pregnant women with a Medicaid card; and children who have applied for Medicaid or [place] Health Choice, but were declined, whose parents can pay a reduced fee at time of service.  °[facility] [place]  [address], [place] (336) 641-7733 Accepts children up to age 21 who are enrolled in Medicaid or [place] Health Choice; pregnant women with a Medicaid card; and children who have applied for Medicaid or [place] Health Choice, but were declined, whose   parents can pay a reduced fee at time of service.  °[facility] ° [address], [place] (336) 641-4533 Patients are seen by appointment only. Walk-ins are not accepted. [facility] will see patients 18 years of age and older. °Monday - Tuesday (8am-5pm) °Most Wednesdays (8:30-5pm) °$30 per visit, cash only  °[facility] ° [address], [place] (336) 641-4533 Patients are seen by appointment only. Walk-ins are not accepted. [facility] will see patients 18 years of age and older. °One Wednesday Evening (Monthly: Volunteer Based).  $30 per visit, cash only  °[facility]  (919) 537-3737 for adults; Children under age 4, call Graduate Pediatric Dentistry at ([phone]) [phone]. Children aged 4-14, please call (919) [phone] to request a pediatric application. ° Dental services are provided in all areas of dental care including fillings, crowns and bridges, complete and partial dentures, implants, gum treatment, root canals, and extractions. Preventive care is also provided. Treatment is provided to  both adults and children. °Patients are selected via a lottery and there is often a waiting list. °  °[facility] [address], °[place] ° (336) 763-8833 www.drcivils.[email or web address] °  °[facility] [address], [place], [place] (336)723-1848, Ext. 123 Second and Fourth Thursday of each month, opens at 6:30 AM; Clinic ends at 9 AM.  Patients are seen on a first-come first-served basis, and a limited number are seen during each clinic.  ° °[facility] ° [address], [place], [place] (336) 723-7904   Eligibility Requirements °You must have lived in [place], [place], or [place] counties for at least the last three months. °  You cannot be eligible for state or federal sponsored healthcare insurance, including Veterans Administration, Medicaid, or Medicare. °  You generally cannot be eligible for healthcare insurance through your employer.  °  How to apply: °Eligibility screenings are held every Tuesday and Wednesday afternoon from 1:00 pm until 4:00 pm. You do not need an appointment for the interview!  °[facility] [address], [place], [place] 336-631-2330   °[facility]  336-342-8273   °[facility]  336-703-3100   °[facility]  336-570-6415   ° °Behavioral Health Resources in the Community: °Intensive Outpatient Programs °Organization         Address  Phone  Notes  °[facility] [address], [place], [place] 336-878-6098   °[facility] [address], [place], [place] 336-832-9800   °[facility] [address], [place], [place] ° 336-882-2125   °[facility] [address],  °[place],  1-800-853-5163 or 336-641-4981   °Substance Abuse Resources °Organization         Address  Phone  Notes  °[facility]  336-882-2125   °[facility]  336-784-9470   °[facility]   336-285-9073   °[facility]  336-845-3988   °Residential & Outpatient Substance Abuse Program  1-800-659-3381   °Psychological Services °Organization         Address  Phone  Notes  °[facility]  336- 832-9600   °[facility]  336- 378-7881   °[facility] [address], [place] 1-800-853-5163 or 336-641-4981   ° °Mobile Crisis Teams °Organization         Address  Phone  Notes  °[facility], Mobile Crisis   Care Unit  1-877-626-1772  °Assertive °[facility] ° [address] [place], [place] 336-834-9664  °Sharon DeEsch [address] °[place] 336-554-5454  ° °Self-Help/Support Groups °Organization         Address  Phone             Notes  °[facility] [place] - variety of support groups  336- 373-1402 Call for more information  °Narcotics Anonymous (NA), [facility] [address], °[place]  2 meetings at this location  ° °Residential Treatment Programs °Organization         Address  Phone  Notes  °[facility] [address],    °[place]  1-866-801-8205   °[facility] ° [address], [place], [place] 704-293-8524   °[facility] [address], [place] 336-845-3988 Admissions: 8am-3pm M-F  °[facility] [address]    °[place], [place] 336-841-1104   °[facility] [address], [place], [place] 336-379-7146   °[facility] [address]  °[place], [place] 336-285-9073   °[facility] Intensive Outpatient [address], [place], [place] 336-852-3033   °[facility] [address]  °[place], [place] 1-877-615-2722 or 336-784-9470   °[facility] [address] [place], [place] 336-227-7417 Accepts Medicaid  °[facility] [address]  ° [place] 1-800-659-3381 Substance Abuse/Addiction Treatment  ° °[place] Behavioral Health Resources °Organization         Address  Phone  Notes  °[facility]  (888) 581-9988   °Julie Brannon, PhD [address] [place], [place]   (336) 349-5553 or (336) 951-0000   °[facility]   [address] °[place], [place] (336) 349-4454   °[facility] [address], [place], [place] (336) 342-8316 Insurance/Medicaid/sponsorship through [facility]  °[facility] [address]                                    [place], [place] (336) 342-8316 Therapy/tele-psych/case  °[facility] [address]  ° [place], [place] (336) 349-2233    °Dr. Arfeen  (336) 349-4544   °[facility] [place]  [employer] [place] [facility] 1) [address], [place] °2) [address], [place] °3)  [address], [place] (336) 349-3220 °(336) 342-7768 ° °(336) 342-8140   °[place] [facility] (336) 342-1394 or (336) 342-3537 (After Hours)    ° °   °

## 2014-08-05 NOTE — ED Notes (Signed)
Sitter at bedside. Pt resting comfortably. 

## 2014-08-05 NOTE — Telephone Encounter (Signed)
Met with patient with Tiffany [name] and Dr. Salem Senate as patient was just released from today from [facility] ED after attempted overdose the previous night.  Patient presented with flat affect, level mood and stated she saw Dr. De Nurse on 08/04/14 and requested not to have to see him any more after Dr. De Nurse refused to write any more letters for her employer about patient's reported problems while being on medication.  Patient stated "he gave me the hand" at a previous appointment and Dr. De Nurse agreed to release her to someone else as he was not willing to write any more letters for patient at this time.  Patient stated she then went home and became upset about this and an email she received from her employer.  Patient stated she as having some pain in her chest so she then took 3 extra Klonopin and 2 Cymbalta thinking this would help.  Patient stated she then got her husband to take her to the emergency room as "it only made things worse".  Patient denies any suicidal or homicidal ideations at this time but reports need to obtain a new psychiatrist due to not being happy with Dr. De Nurse.  Informed per typical procedure, Dr. De Nurse would have to speak with a provider at our office to discuss taking the case.  Informed with Dr. Adele Schilder off this week and no new appointments until the end of August, Dr. Doyne Keel out on maternity leave and Dr. Salem Senate primarily seeing children and adolescents it may be best to refer her to another outside agency to get her a sooner follow up.  Patient agreed with this plan and informed Dr. Salem Senate she thought she had enough medication at least for a month or so until she could get in with someone else.  Tiffany [name] agreed to help refer patient to another practice to help her get in to see someone sooner.  Patient has an appointment with Pauline Good, PA-C with Dr. Tomasita Crumble [facility]'s office.  Patient is to call them to set up time on Thursday 08/13/14 as their computers were down when  Tiffany [name] called and could not give her a specific time.  Tiffany [name] arranged for patient to call their office back to clarify a time and to call us back if any problems.  Patient will have her records requested from [place] once she is at her new provider and again to call back if any problems.   Patient denied any suicidal or homicidal ideations at this time nor other symptoms or concerns.
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
## 2014-08-05 NOTE — ED Notes (Signed)
Patient will be staying here until seen by the psychiatrist later in the day

## 2014-08-05 NOTE — ED Notes (Signed)
Belongings and valuables released to patient, patient signed for them.

## 2014-08-05 NOTE — ED Notes (Signed)
Poison control called-[facility]- Informed that patient took 120 mg cymbalta and 6mg  of Klonopin. Pt also took her Welbutrin earlier today. They state to watch for tachycardia and hypertension from the cymbalta but mostly we will just see drowsiness and it is unlikely to see any blood pressure effects from the dosages taken.

## 2014-08-05 NOTE — ED Notes (Signed)
Pt belongings inventoried and placed in locker and with security. See inventory sheet.

## 2014-08-05 NOTE — ED Provider Notes (Signed)
CSN: [id number]   Arrival date & time 08/05/14 0040  History  This chart was scribed for  Sherrin Stahle, MD by Altamease Oiler, ED Scribe. This patient was seen in room B15C/B15C and the patient's care was started at 12:57 AM.  Chief Complaint  Patient presents with  . Dizziness    HPI Patient is a 39 y.o. female presenting with Overdose. The history is provided by the patient and the spouse. No language interpreter was used.  Drug Overdose This is a new problem. The current episode started 1 to 2 hours ago. The problem occurs constantly. The problem has not changed since onset.Associated symptoms include chest pain. Nothing aggravates the symptoms. Nothing relieves the symptoms. She has tried nothing for the symptoms.   Tiffany [name] is a 39 y.o. female who presents to the Emergency Department complaining of intentional drug overdose 1.5 hours ago. Pt states that she took 3 extra klonopin (notes taking 3 earlier for a total of 6 tablets total) and 2 or 3 extra Cymbalta (120 mg total) because she wasn't feeling good and got upset about something at work. Pt notes having chest pain associated with emotional upset and that Cymbalta usually relieves the pain. She also took 1 Wellbutrin this morning. Pt states that she was feeling sad and hopeless but denies SI.   Past Medical History  Diagnosis Date  . Anxiety   . Depression   . Umbilical hernia   . Anemia     Past Surgical History  Procedure Laterality Date  . Cesarean section      2003  . Tubal ligation      2013  . Appendectomy      2002  . Laparoscopic hysterectomy N/A 01/06/2014    Procedure: Attempted  TOTAL LAPAROSCOPIC Hysterectomy,;  Surgeon: Delice Lesch, MD;  Location: [facility];  Service: Gynecology;  Laterality: N/A;  . Abdominal hysterectomy Bilateral 01/06/2014    Procedure: HYSTERECTOMY ABDOMINAL WITH BILATERAL SALPINGECTOMY ;  Surgeon: Delice Lesch, MD;  Location: [facility];  Service: Gynecology;  Laterality:  Bilateral;  . Transverse colon resection N/A 01/06/2014    Procedure: OVERSEW OF SEROSA TEAR OF THE TRANSVERSE COLON ;  Surgeon: Alphonsa Overall, MD;  Location: [facility];  Service: General;  Laterality: N/A;  . Abdominal hysterectomy  2016    Family History  Problem Relation Age of Onset  . Heart disease Mother 1    heart attack  . Depression Mother   . Hypertension Father 48  . Drug abuse Father   . Depression Maternal Aunt   . Anxiety disorder Maternal Aunt   . Anxiety disorder Paternal Grandmother     History  Substance Use Topics  . Smoking status: Never Smoker   . Smokeless tobacco: Never Used  . Alcohol Use: No     Review of Systems  Cardiovascular: Positive for chest pain.  Psychiatric/Behavioral: Positive for depression and substance abuse. Negative for suicidal ideas.  All other systems reviewed and are negative.   Home Medications   Prior to Admission medications   Medication Sig Start Date End Date Taking? Authorizing Provider  acetaminophen (TYLENOL) 325 MG tablet Take 650 mg by mouth every 6 (six) hours as needed for mild pain.    Historical Provider, MD  buPROPion (WELLBUTRIN XL) 150 MG 24 hr tablet Take 1 tablet (150 mg total) by mouth every morning. Patient taking differently: Take 150 mg by mouth 2 (two) times daily.  06/01/14 06/01/15  Merian Capron, MD  ciprofloxacin (CIPRO)  500 MG tablet Take 1 tablet (500 mg total) by mouth 2 (two) times daily. Patient not taking: Reported on 07/06/2014 06/11/14   Gregor Hams, MD  clobetasol ointment (TEMOVATE) 4.19 % Apply 1 application topically daily.     Historical Provider, MD  clonazePAM (KLONOPIN) 1 MG tablet Take 1 tablet (1 mg total) by mouth 3 (three) times daily. 07/07/14 07/07/15  Clarene Reamer, MD  DULoxetine (CYMBALTA) 30 MG capsule Take 1 capsule (30 mg total) by mouth 2 (two) times daily. 07/07/14   Merian Capron, MD  ferrous sulfate 325 (65 FE) MG tablet Take 325 mg by mouth 2 (two) times daily with a meal.     Historical Provider, MD  Multiple Vitamin (MULTIVITAMIN) capsule Take 1 capsule by mouth daily.    Historical Provider, MD  Vitamin D, Ergocalciferol, (DRISDOL) 50000 UNITS CAPS capsule Take 50,000 Units by mouth 2 (two) times a week.    Historical Provider, MD    Allergies  Mango flavor; Amoxicillin; and Penicillins  Triage Vitals: BP 117/79 mmHg  Pulse 90  Temp(Src) 98.2 F (36.8 C)  Resp 20  Ht 5\' 7"  (1.702 m)  Wt 236 lb (107.049 kg)  BMI 36.95 kg/m2  SpO2 99%  LMP 10/24/2013  Physical Exam  Constitutional: She is oriented to person, place, and time. She appears well-developed and well-nourished. No distress.  HENT:  Head: Normocephalic and atraumatic.  Moist mucous membranes No exudate  Eyes: Conjunctivae and EOM are normal. Pupils are equal, round, and reactive to light.  Neck: Neck supple. No tracheal deviation present.  Cardiovascular: Normal rate and regular rhythm.   Pulmonary/Chest: Effort normal and breath sounds normal. No respiratory distress. She has no wheezes. She has no rales.  Musculoskeletal: Normal range of motion.  No cogwheel rigidity   Neurological: She is alert and oriented to person, place, and time. She has normal reflexes.  Skin: Skin is warm and dry.  Nursing note and vitals reviewed.   ED Course  Procedures   DIAGNOSTIC STUDIES: Oxygen Saturation is 99% on RA, normal by my interpretation.    COORDINATION OF CARE: 1:14 AM Discussed treatment plan which includes lab work and EKG with pt at bedside and pt agreed to plan.  Labs Review-  Labs Reviewed  CBC - Abnormal; Notable for the following:    Hemoglobin 11.6 (*)    MCV 76.3 (*)    MCH 24.4 (*)    RDW 16.6 (*)    All other components within normal limits  BASIC METABOLIC PANEL  URINALYSIS, ROUTINE W REFLEX MICROSCOPIC (NOT AT [facility])  ACETAMINOPHEN LEVEL  SALICYLATE LEVEL  URINE RAPID DRUG SCREEN, HOSP PERFORMED  ETHANOL  CBG MONITORING, ED    Imaging Review No results  found.  EKG Interpretation  Date/Time:  Wednesday August 05 2014 00:57:16 EDT Ventricular Rate:  79 PR Interval:  162 QRS Duration: 76 QT Interval:  358 QTC Calculation: 410 R Axis:   73 Text Interpretation:  Normal sinus rhythm Confirmed by [facility]  MD, Uriel Horkey ([id number]) on 08/05/2014 12:56:43 AM       MDM   Final diagnoses:  None   Took too many mads as she felt hopeless which constitutes SI.  She has had escalating problems relating to work and has been using SSRI and benzos to "stop CP" related to stress.  Will need to be seen by psychiatry.    I personally performed the services described in this documentation, which was scribed in my presence. The recorded information  has been reviewed and is accurate.      Veatrice Kells, MD 08/05/14 [phone]

## 2014-08-05 NOTE — ED Notes (Signed)
TTS placed at the bedside.

## 2014-08-05 NOTE — ED Notes (Signed)
Patient changed into wine colored scrubs and wanded by security

## 2014-08-05 NOTE — ED Notes (Signed)
Pt denies any dizziness with walking or standing. NAD noted. Pt ambulated without assistance.

## 2014-08-05 NOTE — ED Notes (Signed)
Pt states "I took these medicines because I got some bad news at work and I was feeling depressed."

## 2014-08-05 NOTE — ED Notes (Signed)
Meal dropped off in patients room.

## 2014-08-05 NOTE — Progress Notes (Signed)
Spoke with C. Withrow, DNP, who has evaluated pt and recommends discharge to follow up with outpatient provider. Pt expressed desire to switch providers, and CSW provided list of outpatient psychiatric providers in area as options.   [name] Bridge, MSW, [place] Clinical Social Work, Disposition  08/05/2014 [phone]
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
## 2014-08-05 NOTE — ED Notes (Signed)
Sitter at bedside.

## 2014-08-06 ENCOUNTER — Telehealth (HOSPITAL_COMMUNITY): Payer: Self-pay | Admitting: *Deleted

## 2014-08-06 ENCOUNTER — Telehealth (HOSPITAL_COMMUNITY): Payer: Self-pay | Admitting: Psychiatry

## 2014-08-06 NOTE — Telephone Encounter (Signed)
Pt called stating she would like to become a new patient at the [place] office. Has been seeing Dr. De Nurse in [place] and states that he was going to write a note that she can be seen in the [place] office. She went to make appointment at the [facility] office but was told it would be next year before she could be seen. Was needing something this year. Her phone number is [phone] and would like to start as soon as possible.

## 2014-08-06 NOTE — Telephone Encounter (Signed)
Patient was seen on 08/05/2014 along with RN Beather Arbour and IOP case manager Dellia Nims on an emergency basis, patient showed up at the clinic stating that if she did not see another psychiatrist. Patient also informed us that she had taken 3 extra Klonopin and to Cymbalta to help calm her down and her husband took her to the emergency room which made things worse and she did not like it. Patient was upset and angry at Dr. De Nurse because he refused to write letters to her employer and so she fired him and now once to see a psychiatrist.  Patient was alert oriented 3, affect was full mood was irritated and angry speech was normal no agitation or aggression was noted she denied suicidal or homicidal ideation and had no hallucinations or delusions. Concentration was good memory was good. Patient was able to contract for safety. Patient stated she had a month's supply of her medications and due to the fact that our adult psychiatrist are completely but patient was referred out and will be seeing Pauline Good nurse practitioner next Thursday. Patient was happy about their arrangements. She'll [facility]. At the time of discharge patient was not a suicidal or homicidal risk.

## 2014-08-06 NOTE — Telephone Encounter (Signed)
Per Nelle Don note on 08/06/14, she has an appt with provider in [facility] [place] office

## 2014-08-18 NOTE — Telephone Encounter (Signed)
See notes

## 2014-09-05 ENCOUNTER — Encounter (HOSPITAL_COMMUNITY): Payer: Self-pay | Admitting: Oncology

## 2014-09-05 ENCOUNTER — Emergency Department (HOSPITAL_COMMUNITY)
Admission: EM | Admit: 2014-09-05 | Discharge: 2014-09-06 | Disposition: A | Discharge status: Home / Self Care | Payer: Federal, State, Local not specified - PPO | Attending: Emergency Medicine | Admitting: Emergency Medicine

## 2014-09-05 DIAGNOSIS — R51 Headache: Secondary | ICD-10-CM | POA: Diagnosis not present

## 2014-09-05 DIAGNOSIS — F4310 Post-traumatic stress disorder, unspecified: Secondary | ICD-10-CM | POA: Insufficient documentation

## 2014-09-05 DIAGNOSIS — F419 Anxiety disorder, unspecified: Secondary | ICD-10-CM | POA: Diagnosis not present

## 2014-09-05 DIAGNOSIS — D649 Anemia, unspecified: Secondary | ICD-10-CM | POA: Diagnosis not present

## 2014-09-05 DIAGNOSIS — Z880 Allergy status to penicillin: Secondary | ICD-10-CM | POA: Insufficient documentation

## 2014-09-05 DIAGNOSIS — Z8719 Personal history of other diseases of the digestive system: Secondary | ICD-10-CM | POA: Diagnosis not present

## 2014-09-05 DIAGNOSIS — R519 Headache, unspecified: Secondary | ICD-10-CM

## 2014-09-05 DIAGNOSIS — F329 Major depressive disorder, single episode, unspecified: Secondary | ICD-10-CM | POA: Insufficient documentation

## 2014-09-05 DIAGNOSIS — Z79899 Other long term (current) drug therapy: Secondary | ICD-10-CM | POA: Insufficient documentation

## 2014-09-05 HISTORY — DX: Post-traumatic stress disorder, unspecified: F43.10

## 2014-09-05 MED ORDER — PROMETHAZINE HCL 25 MG/ML IJ SOLN
25.0000 mg | Freq: Once | INTRAMUSCULAR | Status: AC
  Administered 2014-09-05: 25 mg via INTRAVENOUS
  Filled 2014-09-05: qty 1

## 2014-09-05 MED ORDER — SODIUM CHLORIDE 0.9 % IV BOLUS (SEPSIS)
1000.0000 mL | Freq: Once | INTRAVENOUS | Status: AC
  Administered 2014-09-05: 1000 mL via INTRAVENOUS

## 2014-09-05 MED ORDER — METOCLOPRAMIDE HCL 5 MG/ML IJ SOLN
10.0000 mg | Freq: Once | INTRAMUSCULAR | Status: AC
  Administered 2014-09-05: 10 mg via INTRAVENOUS
  Filled 2014-09-05: qty 2

## 2014-09-05 MED ORDER — KETOROLAC TROMETHAMINE 30 MG/ML IJ SOLN
30.0000 mg | Freq: Once | INTRAMUSCULAR | Status: AC
  Administered 2014-09-05: 30 mg via INTRAVENOUS
  Filled 2014-09-05: qty 1

## 2014-09-05 NOTE — ED Provider Notes (Signed)
CSN: [id number]     Arrival date & time 09/05/14  2024 History   First MD Initiated Contact with Patient 09/05/14 2159     Chief Complaint  Patient presents with  . Headache     (Consider location/radiation/quality/duration/timing/severity/associated sxs/prior Treatment) HPI   39 year old female with history of anxiety depression and PTSD presenting for evaluation of headache. Patient states that she has history of migraine diagnosed about a year and have ago which has been well controlled. She has been taken Cymbalta and Wellbutrin for the past several months for anxiety and depression but last week she was told that the comminution of medication is home for the full she is transitioned over to BuSpar and Wellbutrin. Since changing the medication for the past several days patient has noticed worsening headache. Today she described a sharp throbbing sensation across her head with tingling sensation to the face, having double vision, and having generalized weakness throughout all 4 extremities. Symptom has been persistent. She endorses fatigue. She denies fever, chills, neck stiffness, URI symptoms, active chest pain, shortness of breath, abdominal pain, nausea or vomiting. She does report some loose stools, having light and sound sensitivity and symptoms worsen with movement. No complaint of confusion or facial droop.  Past Medical History  Diagnosis Date  . Anxiety   . Depression   . Umbilical hernia   . Anemia   . PTSD (post-traumatic stress disorder)    Past Surgical History  Procedure Laterality Date  . Cesarean section      2003  . Tubal ligation      2013  . Appendectomy      2002  . Laparoscopic hysterectomy N/A 01/06/2014    Procedure: Attempted  TOTAL LAPAROSCOPIC Hysterectomy,;  Surgeon: Delice Lesch, MD;  Location: [facility];  Service: Gynecology;  Laterality: N/A;  . Abdominal hysterectomy Bilateral 01/06/2014    Procedure: HYSTERECTOMY ABDOMINAL WITH BILATERAL  SALPINGECTOMY ;  Surgeon: Delice Lesch, MD;  Location: [facility];  Service: Gynecology;  Laterality: Bilateral;  . Transverse colon resection N/A 01/06/2014    Procedure: OVERSEW OF SEROSA TEAR OF THE TRANSVERSE COLON ;  Surgeon: Alphonsa Overall, MD;  Location: [facility];  Service: General;  Laterality: N/A;  . Abdominal hysterectomy  2016   Family History  Problem Relation Age of Onset  . Heart disease Mother 11    heart attack  . Depression Mother   . Hypertension Father 65  . Drug abuse Father   . Depression Maternal Aunt   . Anxiety disorder Maternal Aunt   . Anxiety disorder Paternal Grandmother    Social History  Substance Use Topics  . Smoking status: Never Smoker   . Smokeless tobacco: Never Used  . Alcohol Use: No   OB History    Gravida Para Term Preterm AB TAB SAB Ectopic Multiple Living   4    1  1   4      Review of Systems  All other systems reviewed and are negative.     Allergies  Mango flavor; Amoxicillin; and Penicillins  Home Medications   Prior to Admission medications   Medication Sig Start Date End Date Taking? Authorizing Provider  acetaminophen (TYLENOL) 325 MG tablet Take 650 mg by mouth every 6 (six) hours as needed for mild pain.   Yes Historical Provider, MD  buPROPion (WELLBUTRIN XL) 300 MG 24 hr tablet Take 1 tablet by mouth daily. 08/24/14  Yes Historical Provider, MD  busPIRone (BUSPAR) 15 MG tablet Take 1 tablet  by mouth 2 (two) times daily. 08/24/14  Yes Historical Provider, MD  clobetasol ointment (TEMOVATE) 8.84 % Apply 1 application topically daily.    Yes Historical Provider, MD  clonazePAM (KLONOPIN) 1 MG tablet Take 1 tablet (1 mg total) by mouth 3 (three) times daily. Patient taking differently: Take 1 mg by mouth 3 (three) times daily as needed for anxiety.  07/07/14 07/07/15 Yes Clarene Reamer, MD  doxycycline (MONODOX) 100 MG capsule Take 1 capsule by mouth 2 (two) times daily. 08/25/14-09/23/14 30 day supply, pt states some days she forgets  to take med 08/25/14  Yes Historical Provider, MD  ferrous sulfate 325 (65 FE) MG tablet Take 325 mg by mouth 2 (two) times daily with a meal.   Yes Historical Provider, MD  Multiple Vitamin (MULTIVITAMIN) capsule Take 1 capsule by mouth daily.   Yes Historical Provider, MD  traZODone (DESYREL) 50 MG tablet Take 1 tablet by mouth at bedtime. 08/24/14  Yes Historical Provider, MD  Vitamin D, Ergocalciferol, (DRISDOL) 50000 UNITS CAPS capsule Take 50,000 Units by mouth 2 (two) times a week.   Yes Historical Provider, MD  buPROPion (WELLBUTRIN XL) 150 MG 24 hr tablet Take 1 tablet (150 mg total) by mouth every morning. Patient not taking: Reported on 09/05/2014 06/01/14 06/01/15  Merian Capron, MD  DULoxetine (CYMBALTA) 30 MG capsule Take 1 capsule (30 mg total) by mouth 2 (two) times daily. Patient not taking: Reported on 09/05/2014 07/07/14   Merian Capron, MD   BP 118/78 mmHg  Pulse 80  Temp(Src) 98.4 F (36.9 C) (Oral)  Resp 16  Ht 5\' 7"  (1.702 m)  Wt 237 lb (107.502 kg)  BMI 37.11 kg/m2  SpO2 99%  LMP 10/24/2013 Physical Exam  Constitutional: She is oriented to person, place, and time. She appears well-developed and well-nourished. No distress.  HENT:  Head: Atraumatic.  Right Ear: External ear normal.  Left Ear: External ear normal.  Nose: Nose normal.  Mouth/Throat: Oropharynx is clear and moist. No oropharyngeal exudate.  Eyes: Conjunctivae and EOM are normal. Pupils are equal, round, and reactive to light.  Neck: Neck supple.  No nuchal rigidity  Musculoskeletal:  Poor effort in all four extremities with movement without focal weakness.  Neurological: She is alert and oriented to person, place, and time.  Neurologic exam:  Speech clear, pupils equal round reactive to light, extraocular movements intact  Normal peripheral visual fields Cranial nerves III through XII normal including no facial droop Follows commands, moves all extremities x4, normal strength to bilateral upper and  lower extremities at all major muscle groups including grip Sensation normal to light touch  Coordination intact, no limb ataxia, finger-nose-finger normal Rapid alternating movements normal No pronator drift Gait mildly antalgic   Skin: No rash noted.  Psychiatric: She has a normal mood and affect.  Nursing note and vitals reviewed.   ED Course  Procedures (including critical care time)  Patient with history of migraine headache here for worsening headache. She has no focal neuro deficit on exam concerning for stroke. No fever or nuchal rigidity concerning for meningitis. No acute onset of headache concerning for subarachnoid hemorrhage. The headache improves after receiving migraine cocktail. Patient would like to go home. Return precautions discussed.  Labs Review Labs Reviewed - No data to display  Imaging Review No results found. I, Lela Murfin, personally reviewed and evaluated these images and lab results as part of my medical decision-making.   EKG Interpretation None      MDM  Final diagnoses:  Recurrent headache    BP 120/77 mmHg  Pulse 70  Temp(Src) 98.4 F (36.9 C) (Oral)  Resp 19  Ht 5\' 7"  (1.702 m)  Wt 237 lb (107.502 kg)  BMI 37.11 kg/m2  SpO2 94%  LMP 10/24/2013     Domenic Moras, PA-C 09/06/14 0020  Nat Christen, MD 09/06/14 1555

## 2014-09-05 NOTE — ED Notes (Signed)
Pt presents d/t HA and tingling.  No acute sx of CVA, speech is clear, face symmetrical grips equal b/l.  Pt began to taper off Cymbalta 60mg  down to 30 mg 08/24/14. Last taken on 09/04/14.  Pt switched to buspar.  Pt is A&O x 4.

## 2014-09-06 NOTE — Discharge Instructions (Signed)

## 2014-10-07 ENCOUNTER — Encounter (HOSPITAL_COMMUNITY): Payer: Self-pay

## 2014-10-07 ENCOUNTER — Emergency Department (HOSPITAL_COMMUNITY)
Admission: EM | Admit: 2014-10-07 | Discharge: 2014-10-07 | Disposition: A | Discharge status: Home / Self Care | Payer: Federal, State, Local not specified - PPO | Source: Home / Self Care

## 2014-10-07 ENCOUNTER — Inpatient Hospital Stay (HOSPITAL_COMMUNITY)
Admission: AD | Admit: 2014-10-07 | Discharge: 2014-10-08 | Disposition: A | Discharge status: Home / Self Care | Payer: Federal, State, Local not specified - PPO | Source: Ambulatory Visit | Attending: Obstetrics and Gynecology | Admitting: Obstetrics and Gynecology

## 2014-10-07 DIAGNOSIS — F410 Panic disorder [episodic paroxysmal anxiety] without agoraphobia: Secondary | ICD-10-CM | POA: Diagnosis not present

## 2014-10-07 DIAGNOSIS — Z880 Allergy status to penicillin: Secondary | ICD-10-CM

## 2014-10-07 DIAGNOSIS — R05 Cough: Secondary | ICD-10-CM

## 2014-10-07 DIAGNOSIS — F329 Major depressive disorder, single episode, unspecified: Secondary | ICD-10-CM | POA: Diagnosis not present

## 2014-10-07 DIAGNOSIS — R1031 Right lower quadrant pain: Secondary | ICD-10-CM | POA: Diagnosis not present

## 2014-10-07 DIAGNOSIS — R059 Cough, unspecified: Secondary | ICD-10-CM

## 2014-10-07 LAB — URINALYSIS, ROUTINE W REFLEX MICROSCOPIC
Bilirubin Urine: NEGATIVE
Glucose, UA: NEGATIVE mg/dL
Hgb urine dipstick: NEGATIVE
Ketones, ur: NEGATIVE mg/dL
LEUKOCYTES UA: NEGATIVE
NITRITE: NEGATIVE
PH: 5.5 (ref 5.0–8.0)
Protein, ur: NEGATIVE mg/dL
SPECIFIC GRAVITY, URINE: 1.02 (ref 1.005–1.030)
UROBILINOGEN UA: 0.2 mg/dL (ref 0.0–1.0)

## 2014-10-07 MED ORDER — OXYCODONE-ACETAMINOPHEN 5-325 MG PO TABS
1.0000 | ORAL_TABLET | ORAL | Status: DC | PRN
  Administered 2014-10-08: 2 via ORAL
  Filled 2014-10-07: qty 2

## 2014-10-07 NOTE — MAU Note (Signed)
Pt presents to MAU with pain in pelvic area and chest. States that pain started yesterday, but got worse today.

## 2014-10-07 NOTE — Progress Notes (Signed)
Windy Kalata CNM called with pt's complaints, states she will come and evaluate pt

## 2014-10-08 ENCOUNTER — Encounter (HOSPITAL_COMMUNITY): Payer: Self-pay | Admitting: Obstetrics and Gynecology

## 2014-10-08 ENCOUNTER — Inpatient Hospital Stay (HOSPITAL_COMMUNITY): Payer: Federal, State, Local not specified - PPO

## 2014-10-08 DIAGNOSIS — Z880 Allergy status to penicillin: Secondary | ICD-10-CM

## 2014-10-08 LAB — CBC WITH DIFFERENTIAL/PLATELET
Basophils Absolute: 0 10*3/uL (ref 0.0–0.1)
Basophils Relative: 1 %
Eosinophils Absolute: 0.3 10*3/uL (ref 0.0–0.7)
Eosinophils Relative: 5 %
HCT: 33.7 % — ABNORMAL LOW (ref 36.0–46.0)
Hemoglobin: 10.8 g/dL — ABNORMAL LOW (ref 12.0–15.0)
LYMPHS ABS: 1.6 10*3/uL (ref 0.7–4.0)
Lymphocytes Relative: 29 %
MCH: 24.7 pg — AB (ref 26.0–34.0)
MCHC: 32 g/dL (ref 30.0–36.0)
MCV: 76.9 fL — ABNORMAL LOW (ref 78.0–100.0)
MONOS PCT: 12 %
Monocytes Absolute: 0.7 10*3/uL (ref 0.1–1.0)
Neutro Abs: 2.9 10*3/uL (ref 1.7–7.7)
Neutrophils Relative %: 53 %
Platelets: 305 10*3/uL (ref 150–400)
RBC: 4.38 MIL/uL (ref 3.87–5.11)
RDW: 16.4 % — ABNORMAL HIGH (ref 11.5–15.5)
WBC: 5.5 10*3/uL (ref 4.0–10.5)

## 2014-10-08 LAB — WET PREP, GENITAL
Clue Cells Wet Prep HPF POC: NONE SEEN
Trich, Wet Prep: NONE SEEN
YEAST WET PREP: NONE SEEN

## 2014-10-08 LAB — INFLUENZA PANEL BY PCR (TYPE A & B)
H1N1FLUPCR: NOT DETECTED
Influenza A By PCR: NEGATIVE
Influenza B By PCR: NEGATIVE

## 2014-10-08 MED ORDER — AZITHROMYCIN 250 MG PO TABS: ORAL_TABLET | ORAL | Status: DC

## 2014-10-08 MED ORDER — OXYCODONE-ACETAMINOPHEN 5-325 MG PO TABS: 1.0000 | ORAL_TABLET | ORAL | Status: DC | PRN

## 2014-10-08 NOTE — Discharge Instructions (Signed)
Pelvic Pain Female pelvic pain can be caused by many different things and start from a variety of places. Pelvic pain refers to pain that is located in the lower half of the abdomen and between your hips. The pain may occur over a short period of time (acute) or may be reoccurring (chronic). The cause of pelvic pain may be related to disorders affecting the female reproductive organs (gynecologic), but it may also be related to the bladder, kidney stones, an intestinal complication, or muscle or skeletal problems. Getting help right away for pelvic pain is important, especially if there has been severe, sharp, or a sudden onset of unusual pain. It is also important to get help right away because some types of pelvic pain can be life threatening.  CAUSES  Below are only some of the causes of pelvic pain. The causes of pelvic pain can be in one of several categories.   Gynecologic.  Pelvic inflammatory disease.  Sexually transmitted infection.  Ovarian cyst or a twisted ovarian ligament (ovarian torsion).  Uterine lining that grows outside the uterus (endometriosis).  Fibroids, cysts, or tumors.  Ovulation.  Pregnancy.  Pregnancy that occurs outside the uterus (ectopic pregnancy).  Miscarriage.  Labor.  Abruption of the placenta or ruptured uterus.  Infection.  Uterine infection (endometritis).  Bladder infection.  Diverticulitis.  Miscarriage related to a uterine infection (septic abortion).  Bladder.  Inflammation of the bladder (cystitis).  Kidney stone(s).  Gastrointestinal.  Constipation.  Diverticulitis.  Neurologic.  Trauma.  Feeling pelvic pain because of mental or emotional causes (psychosomatic).  Cancers of the bowel or pelvis. EVALUATION  Your caregiver will want to take a careful history of your concerns. This includes recent changes in your health, a careful gynecologic history of your periods (menses), and a sexual history. Obtaining your family  history and medical history is also important. Your caregiver may suggest a pelvic exam. A pelvic exam will help identify the location and severity of the pain. It also helps in the evaluation of which organ system may be involved. In order to identify the cause of the pelvic pain and be properly treated, your caregiver may order tests. These tests may include:   A pregnancy test.  Pelvic ultrasonography.  An X-ray exam of the abdomen.  A urinalysis or evaluation of vaginal discharge.  Blood tests. HOME CARE INSTRUCTIONS   Only take over-the-counter or prescription medicines for pain, discomfort, or fever as directed by your caregiver.   Rest as directed by your caregiver.   Eat a balanced diet.   Drink enough fluids to make your urine clear or pale yellow, or as directed.   Avoid sexual intercourse if it causes pain.   Apply warm or cold compresses to the lower abdomen depending on which one helps the pain.   Avoid stressful situations.   Keep a journal of your pelvic pain. Write down when it started, where the pain is located, and if there are things that seem to be associated with the pain, such as food or your menstrual cycle.  Follow up with your caregiver as directed.  SEEK MEDICAL CARE IF:  Your medicine does not help your pain.  You have abnormal vaginal discharge. SEEK IMMEDIATE MEDICAL CARE IF:   You have heavy bleeding from the vagina.   Your pelvic pain increases.   You feel light-headed or faint.   You have chills.   You have pain with urination or blood in your urine.   You have uncontrolled diarrhea   or vomiting.   You have a fever or persistent symptoms for more than 3 days.  You have a fever and your symptoms suddenly get worse.   You are being physically or sexually abused.  MAKE SURE YOU:  Understand these instructions.  Will watch your condition.  Will get help if you are not doing well or get worse. Document Released:  12/07/2003 Document Revised: 05/26/2013 Document Reviewed: 05/01/2011 Exit[employer] Patient Information 2015 [employer]. This information is not intended to replace advice given to you by your health care provider. Make sure you discuss any questions you have with your health care provider.  

## 2014-10-08 NOTE — MAU Provider Note (Signed)
History   39 yo Tiffany [name] presented unannounced c/o recurrent RLQ pain, URI sx, and chest discomfort from anxiety.  Denies fever, dysuria, constipation, diarrhea, viral exposures, vaginal d/c/bleeding.  She presented to an Urgent Care yesterday for URI sx, "but they didn't do anything".  Denies SOB, crushing chest pain, pain in arms, or N/V.  Reports she feels her chest is tight due to pain and anxiety.  Hx remarkable for abdominal hysterectomy for menorrhagia 01/07/2014 with Dr. Lucille Passy planned as L/S, but multiple adhesions were noted, and it was converted to abdominal procedure.  Serosal tear of the transverse colon was noted, with adhesions of the omentum.  General surgery was called, and Dr. Alphonsa Overall scrubbed into the case--he oversew the serosal tear, and the patient had an uncomplicated post-operative course.  Presented to MAU 01/28/14 for abdominal pain, with negative evaluation--home with Ibuprophen and Percocet. Presented to MAU 02/20/14 with urinary leakage and abdominal pain--labs and evaluation WNL--home. Presented to MAU 02/22/14 with RLQ pain--CT showed small hemorrhagic cyst on left ovary, with suspicion of recent ovarian cyst rupture.  Labs stable, Rx Percocet.  Plan for f/u in office 02/23/14. Seen in office 2/1 for f/u--Plan for f/u US in 6 weeks. Seen in office 04/06/14 for f/u--US showed lobular cystic structure in right adnexa, ? Hydrosalpinx.  Plan for repeat in 6 months.  Considered plan for cystoscopy due to sporadic urinary pain and UTIs. Seen 06/01/14 in office for f/u--Pain on right still present.  Instructed to keep pain diary, and f/u in September for repeat US.  Cystoscopy deferred due to resolution of urinary sx.  Has f/u visit and [place] scheduled 10/16/14 with Dr. Mancel Bale.  Patient has had multiple outpatient visits in 2016 due to worsening anxiety and PTSD--followed by provider at Dr. Donzetta Sprung office.  Patient Active Problem List   Diagnosis Date Noted   . Allergy to penicillin 10/08/2014  . Panic disorder without agoraphobia with severe panic attacks 08/05/2014  . Depression, major, recurrent, moderate 07/02/2014    Class: Chronic  . Generalized anxiety disorder 07/02/2014    Class: Chronic  . Right sided abdominal pain 02/20/2014  . Urinary incontinence in female 02/20/2014  . Post-operative pain 01/28/2014  . Ventral hernia 05/09/2013    Chief Complaint  Patient presents with  . Pelvic Pain  . Chest Pain   HPI:  See above  OB History    Gravida Para Term Preterm AB TAB SAB Ectopic Multiple Living   4    1  1   4       Past Medical History  Diagnosis Date  . Anxiety   . Depression   . Umbilical hernia   . Anemia   . PTSD (post-traumatic stress disorder)     Past Surgical History  Procedure Laterality Date  . Cesarean section      2003  . Tubal ligation      2013  . Appendectomy      2002  . Laparoscopic hysterectomy N/A 01/06/2014    Procedure: Attempted  TOTAL LAPAROSCOPIC Hysterectomy,;  Surgeon: Delice Lesch, MD;  Location: [facility];  Service: Gynecology;  Laterality: N/A;  . Abdominal hysterectomy Bilateral 01/06/2014    Procedure: HYSTERECTOMY ABDOMINAL WITH BILATERAL SALPINGECTOMY ;  Surgeon: Delice Lesch, MD;  Location: [facility];  Service: Gynecology;  Laterality: Bilateral;  . Transverse colon resection N/A 01/06/2014    Procedure: OVERSEW OF SEROSA TEAR OF THE TRANSVERSE COLON ;  Surgeon: Alphonsa Overall, MD;  Location: [facility];  Service: General;  Laterality: N/A;  . Abdominal hysterectomy  2016    Family History  Problem Relation Age of Onset  . Heart disease Mother 57    heart attack  . Depression Mother   . Hypertension Father 37  . Drug abuse Father   . Depression Maternal Aunt   . Anxiety disorder Maternal Aunt   . Anxiety disorder Paternal Grandmother     Social History  Substance Use Topics  . Smoking status: Never Smoker   . Smokeless tobacco: Never Used  . Alcohol Use: No     Allergies:  Allergies  Allergen Reactions  . Mango Flavor Anaphylaxis and Swelling  . Amoxicillin Hives and Swelling  . Penicillins Itching and Swelling    Prescriptions prior to admission  Medication Sig Dispense Refill Last Dose  . acetaminophen (TYLENOL) 325 MG tablet Take 650 mg by mouth every 6 (six) hours as needed for mild pain.   unknown  . buPROPion (WELLBUTRIN XL) 150 MG 24 hr tablet Take 1 tablet (150 mg total) by mouth every morning. (Patient not taking: Reported on 09/05/2014) 30 tablet 0 Not Taking at Unknown time  . buPROPion (WELLBUTRIN XL) 300 MG 24 hr tablet Take 1 tablet by mouth daily.   09/05/2014 at Unknown time  . busPIRone (BUSPAR) 15 MG tablet Take 1 tablet by mouth 2 (two) times daily.   09/05/2014 at Unknown time  . clobetasol ointment (TEMOVATE) 2.58 % Apply 1 application topically daily.    09/05/2014 at Unknown time  . clonazePAM (KLONOPIN) 1 MG tablet Take 1 tablet (1 mg total) by mouth 3 (three) times daily. (Patient taking differently: Take 1 mg by mouth 3 (three) times daily as needed for anxiety. ) 90 tablet 0 09/05/2014 at Unknown time  . doxycycline (MONODOX) 100 MG capsule Take 1 capsule by mouth 2 (two) times daily. 08/25/14-09/23/14 30 day supply, pt states some days she forgets to take med   09/05/2014 at Unknown time  . DULoxetine (CYMBALTA) 30 MG capsule Take 1 capsule (30 mg total) by mouth 2 (two) times daily. (Patient not taking: Reported on 09/05/2014) Tiffany capsule 0 Completed Course at Unknown time  . ferrous sulfate 325 (65 FE) MG tablet Take 325 mg by mouth 2 (two) times daily with a meal.   09/04/2014 at Unknown time  . Multiple Vitamin (MULTIVITAMIN) capsule Take 1 capsule by mouth daily.   09/05/2014 at Unknown time  . traZODone (DESYREL) 50 MG tablet Take 1 tablet by mouth at bedtime.   09/04/2014 at Unknown time  . Vitamin D, Ergocalciferol, (DRISDOL) 50000 UNITS CAPS capsule Take 50,000 Units by mouth 2 (two) times a week.   09/04/2014 at Unknown  time    ROS:  Nasal congestion, cough, HA, RLQ pain Physical Exam   Blood pressure 142/83, pulse Tiffany, temperature 98 F (36.7 C), temperature source Oral, resp. rate 16, last menstrual period 10/24/2013, SpO2 100 %.  Physical Exam  Appears to feel bad with URI sx. Alert, oriented, communicative. Throat slightly red Ears--drum mildly erythematous + frontal sinus tenderness to palpation. Chest slightly diminished breath sounds in lower bases Heart RRR without murmur Abd soft, + tenderness in RLQ, no rebound or guarding, + bowel sounds. Pelvic--small amount white vaginal d/c, uterus and cervix absent, mild fullness in right adnexal area Ext WNL   ED Course  Assessment: Recurrent RLQ pain--hx abdominal hysterectomy 01/07/15 for menorrhagia Hx ? Hydrosalpinx on previous US  URI sx  Plan: Consulted with Dr. Mancel Bale regarding RLQ  pain. PO pain med CBC/diff UA Wet prep Will check CXR Flu testing Anticipate d/c home after pain med and test results received. Plan r/s office appt/US for earlier than 10/16/14.   Donnel Saxon CNM, MSN 10/08/2014 12:23 AM   Addendum: Feeling better after Percocet 2 tabs--pain now down to 4/10.  Results for orders placed or performed during the hospital encounter of 10/07/14 (from the past 24 hour(s))  Urinalysis, Routine w reflex microscopic (not at [facility])     Status: None   Collection Time: 10/07/14 10:10 PM  Result Value Ref Range   Color, Urine YELLOW YELLOW   APPearance CLEAR CLEAR   Specific Gravity, Urine 1.020 1.005 - 1.030   pH 5.5 5.0 - 8.0   Glucose, UA NEGATIVE NEGATIVE mg/dL   Hgb urine dipstick NEGATIVE NEGATIVE   Bilirubin Urine NEGATIVE NEGATIVE   Ketones, ur NEGATIVE NEGATIVE mg/dL   Protein, ur NEGATIVE NEGATIVE mg/dL   Urobilinogen, UA 0.2 0.0 - 1.0 mg/dL   Nitrite NEGATIVE NEGATIVE   Leukocytes, UA NEGATIVE NEGATIVE  Wet prep, genital     Status: Abnormal   Collection Time: 10/07/14 11:52 PM  Result Value Ref Range    Yeast Wet Prep HPF POC NONE SEEN NONE SEEN   Trich, Wet Prep NONE SEEN NONE SEEN   Clue Cells Wet Prep HPF POC NONE SEEN NONE SEEN   WBC, Wet Prep HPF POC FEW (A) NONE SEEN  CBC with Differential/Platelet     Status: Abnormal   Collection Time: 10/08/14 12:10 AM  Result Value Ref Range   WBC 5.5 4.0 - 10.5 K/uL   RBC 4.38 3.87 - 5.11 MIL/uL   Hemoglobin 10.8 (L) 12.0 - 15.0 g/dL   HCT 33.7 (L) 36.0 - 46.0 %   MCV 76.9 (L) 78.0 - 100.0 fL   MCH 24.7 (L) 26.0 - 34.0 pg   MCHC 32.0 30.0 - 36.0 g/dL   RDW 16.4 (H) 11.5 - 15.5 %   Platelets 305 150 - 400 K/uL   Neutrophils Relative % 53 %   Neutro Abs 2.9 1.7 - 7.7 K/uL   Lymphocytes Relative 29 %   Lymphs Abs 1.6 0.7 - 4.0 K/uL   Monocytes Relative 12 %   Monocytes Absolute 0.7 0.1 - 1.0 K/uL   Eosinophils Relative 5 %   Eosinophils Absolute 0.3 0.0 - 0.7 K/uL   Basophils Relative 1 %   Basophils Absolute 0.0 0.0 - 0.1 K/uL   CXR negative  Impression: Persistent RLQ pain--? Right hydrosalpinx Hx abdominal hysterectomy Anxiety/depression/panic disorder  Plan: D/C home. Rx Percocet, #36, no refills Rx Zpak F/u on flu testing Office will call patient in am to coordinate earlier appt with Dr. Mancel Bale for f/u US. To f/u as needed with increased pain. Note for work absences 9/14, 9/15, 9/16.  Donnel Saxon, CNM 10/08/14 1:30a

## 2015-04-29 DIAGNOSIS — F411 Generalized anxiety disorder: Secondary | ICD-10-CM | POA: Diagnosis not present

## 2015-04-29 DIAGNOSIS — F330 Major depressive disorder, recurrent, mild: Secondary | ICD-10-CM | POA: Diagnosis not present

## 2015-05-06 DIAGNOSIS — F330 Major depressive disorder, recurrent, mild: Secondary | ICD-10-CM | POA: Diagnosis not present

## 2015-05-06 DIAGNOSIS — F411 Generalized anxiety disorder: Secondary | ICD-10-CM | POA: Diagnosis not present

## 2015-05-13 DIAGNOSIS — F411 Generalized anxiety disorder: Secondary | ICD-10-CM | POA: Diagnosis not present

## 2015-05-13 DIAGNOSIS — F330 Major depressive disorder, recurrent, mild: Secondary | ICD-10-CM | POA: Diagnosis not present

## 2015-05-14 DIAGNOSIS — F411 Generalized anxiety disorder: Secondary | ICD-10-CM | POA: Diagnosis not present

## 2015-06-04 DIAGNOSIS — R1031 Right lower quadrant pain: Secondary | ICD-10-CM | POA: Diagnosis not present

## 2015-06-04 DIAGNOSIS — G8929 Other chronic pain: Secondary | ICD-10-CM | POA: Diagnosis not present

## 2015-06-10 DIAGNOSIS — M62838 Other muscle spasm: Secondary | ICD-10-CM | POA: Diagnosis not present

## 2015-06-10 DIAGNOSIS — M25551 Pain in right hip: Secondary | ICD-10-CM | POA: Diagnosis not present

## 2015-06-10 DIAGNOSIS — M6281 Muscle weakness (generalized): Secondary | ICD-10-CM | POA: Diagnosis not present

## 2015-06-10 DIAGNOSIS — N3946 Mixed incontinence: Secondary | ICD-10-CM | POA: Diagnosis not present

## 2015-06-25 DIAGNOSIS — F411 Generalized anxiety disorder: Secondary | ICD-10-CM | POA: Diagnosis not present

## 2015-07-14 DIAGNOSIS — H5712 Ocular pain, left eye: Secondary | ICD-10-CM | POA: Diagnosis not present

## 2015-07-14 DIAGNOSIS — H10413 Chronic giant papillary conjunctivitis, bilateral: Secondary | ICD-10-CM | POA: Diagnosis not present

## 2015-07-14 DIAGNOSIS — H11442 Conjunctival cysts, left eye: Secondary | ICD-10-CM | POA: Diagnosis not present

## 2015-07-28 DIAGNOSIS — H11442 Conjunctival cysts, left eye: Secondary | ICD-10-CM | POA: Diagnosis not present

## 2015-07-30 DIAGNOSIS — F411 Generalized anxiety disorder: Secondary | ICD-10-CM | POA: Diagnosis not present

## 2015-08-01 IMAGING — CR DG CHEST 2V
2 series · 2 of 2 positions shown · non-contrast
Comparison: None.

CLINICAL DATA: Left-sided chest and arm pain tonight with dyspnea

EXAM:
CHEST  2 VIEW

[chest pa]
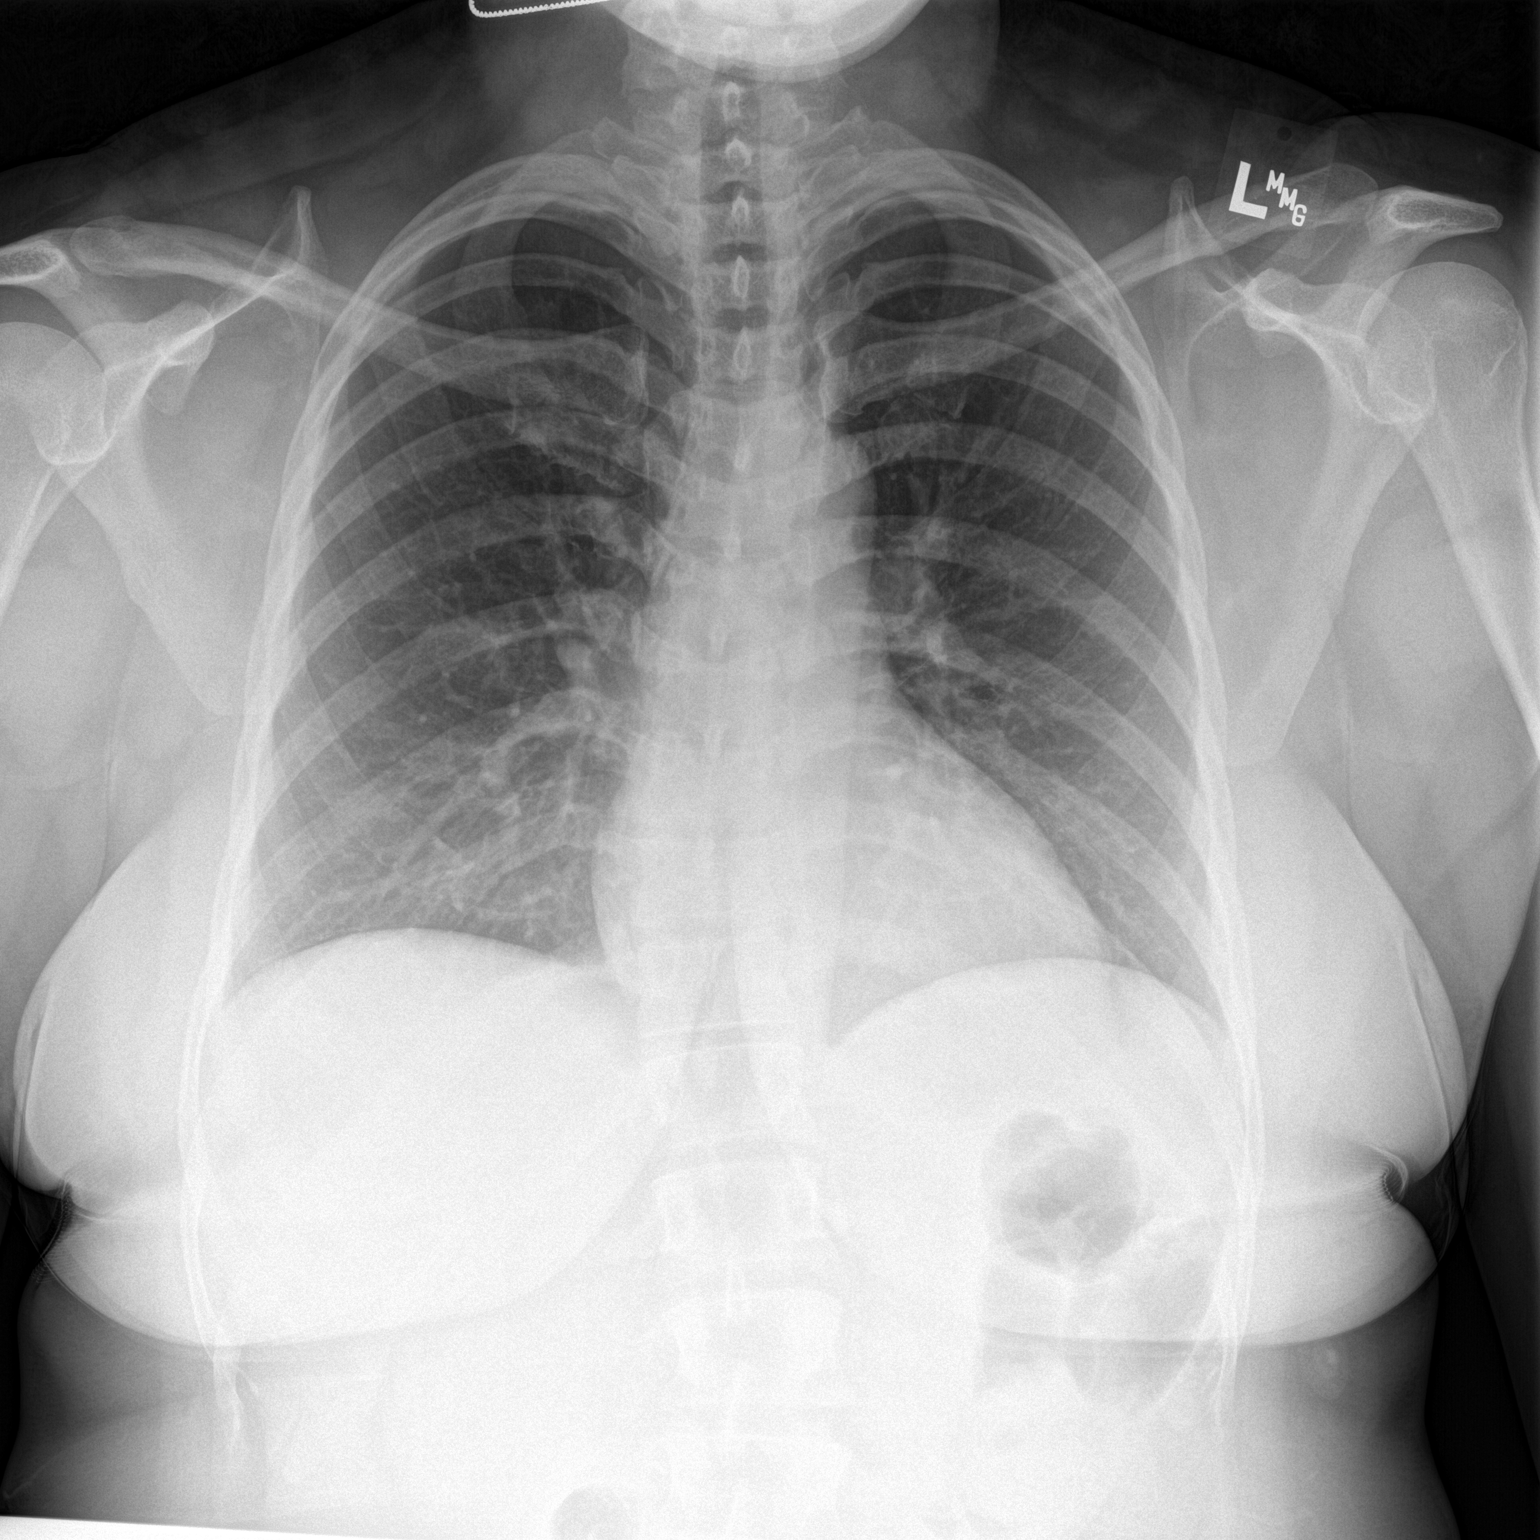

[chest lat]
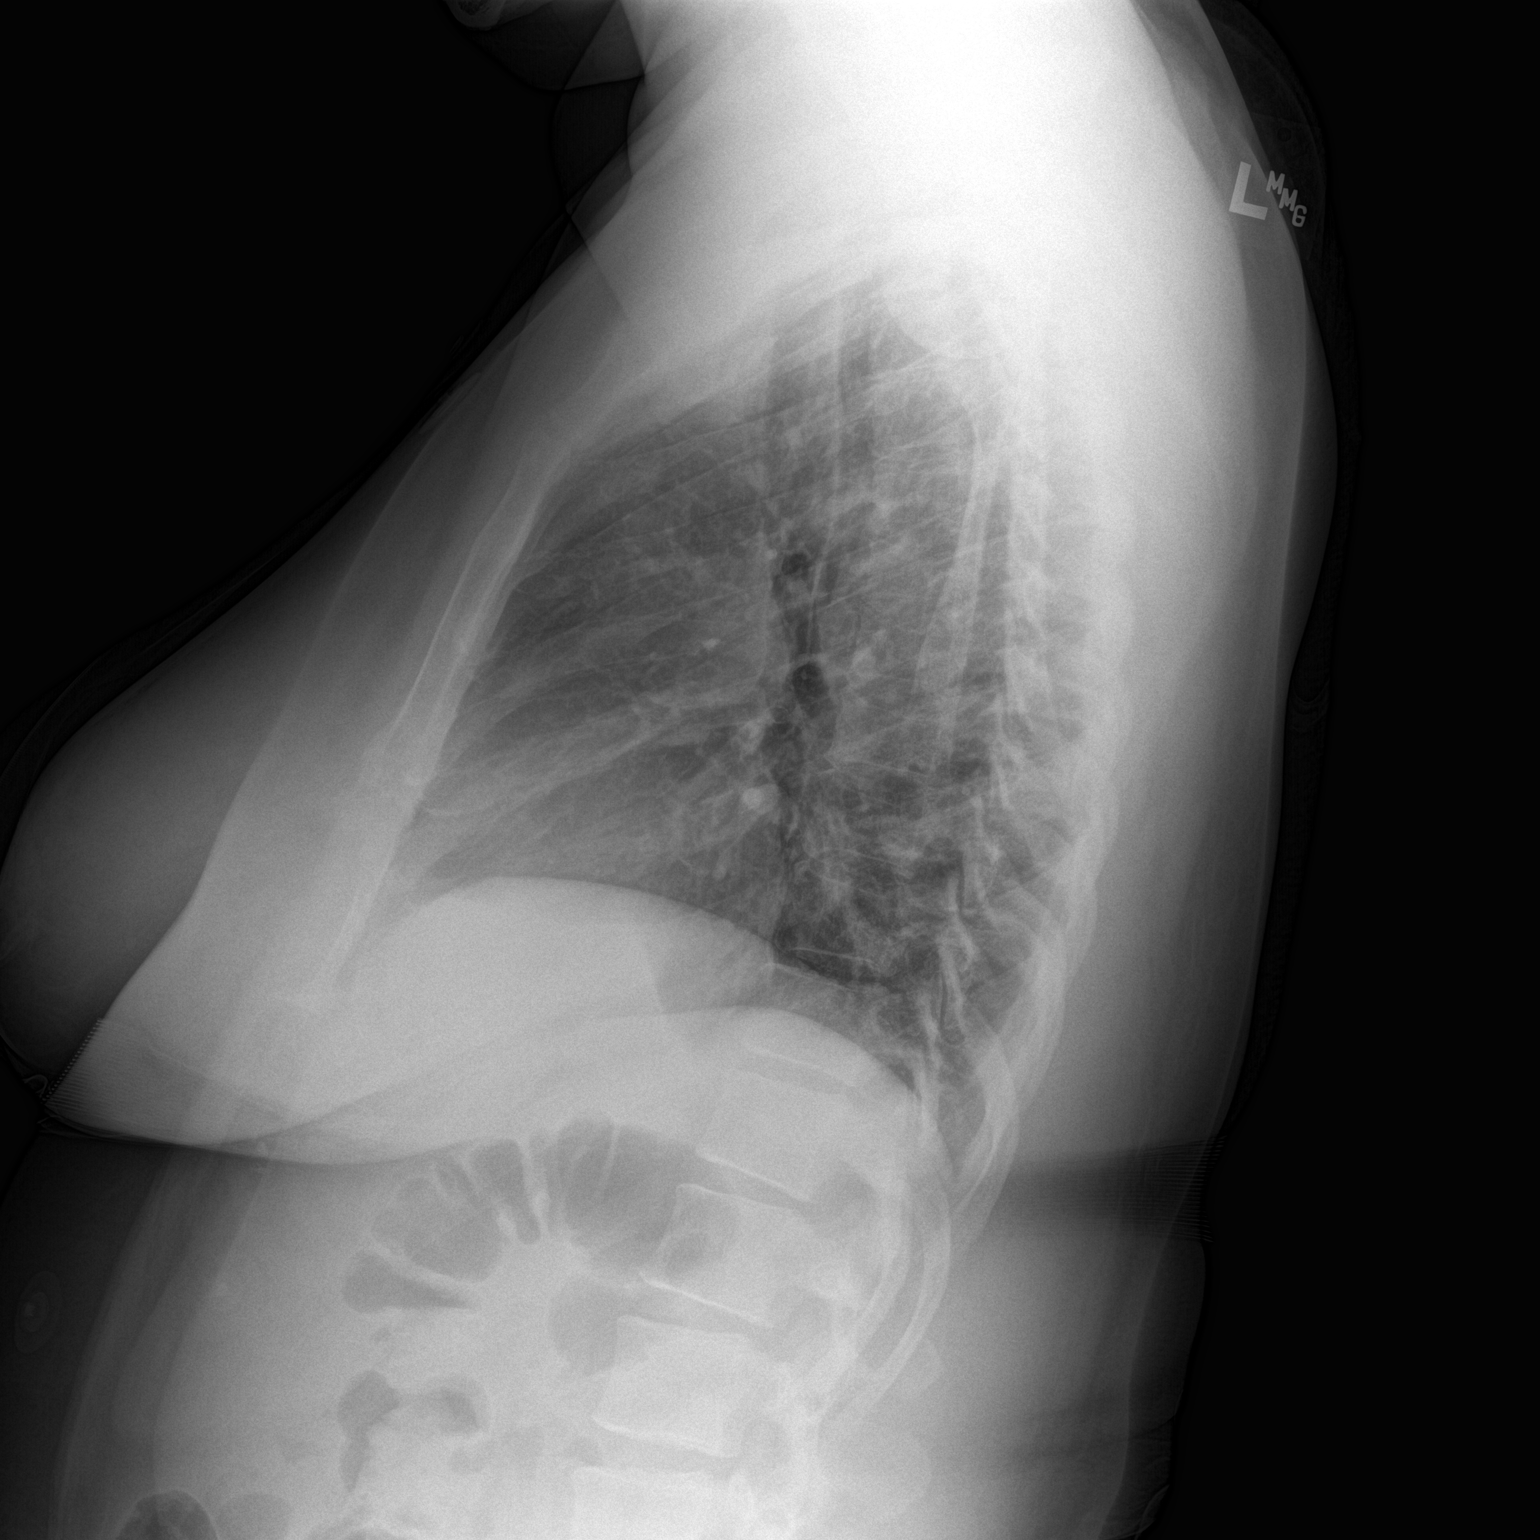

[2 of 2 positions shown; findings below may reference images not displayed]

FINDINGS: The heart size and mediastinal contours are within normal limits.
Both lungs are clear. The visualized skeletal structures are
unremarkable.
IMPRESSION: No active cardiopulmonary disease.

## 2015-08-07 IMAGING — DX DG CHEST 2V
2 series · 2 of 2 positions shown · non-contrast
Comparison: Chest CTA 03/12/2014 and earlier.

CLINICAL DATA: 38-year-old female with chest pain shortness of
breath and intermittent headaches. Initial encounter.

EXAM:
CHEST  2 VIEW

[chest pa]
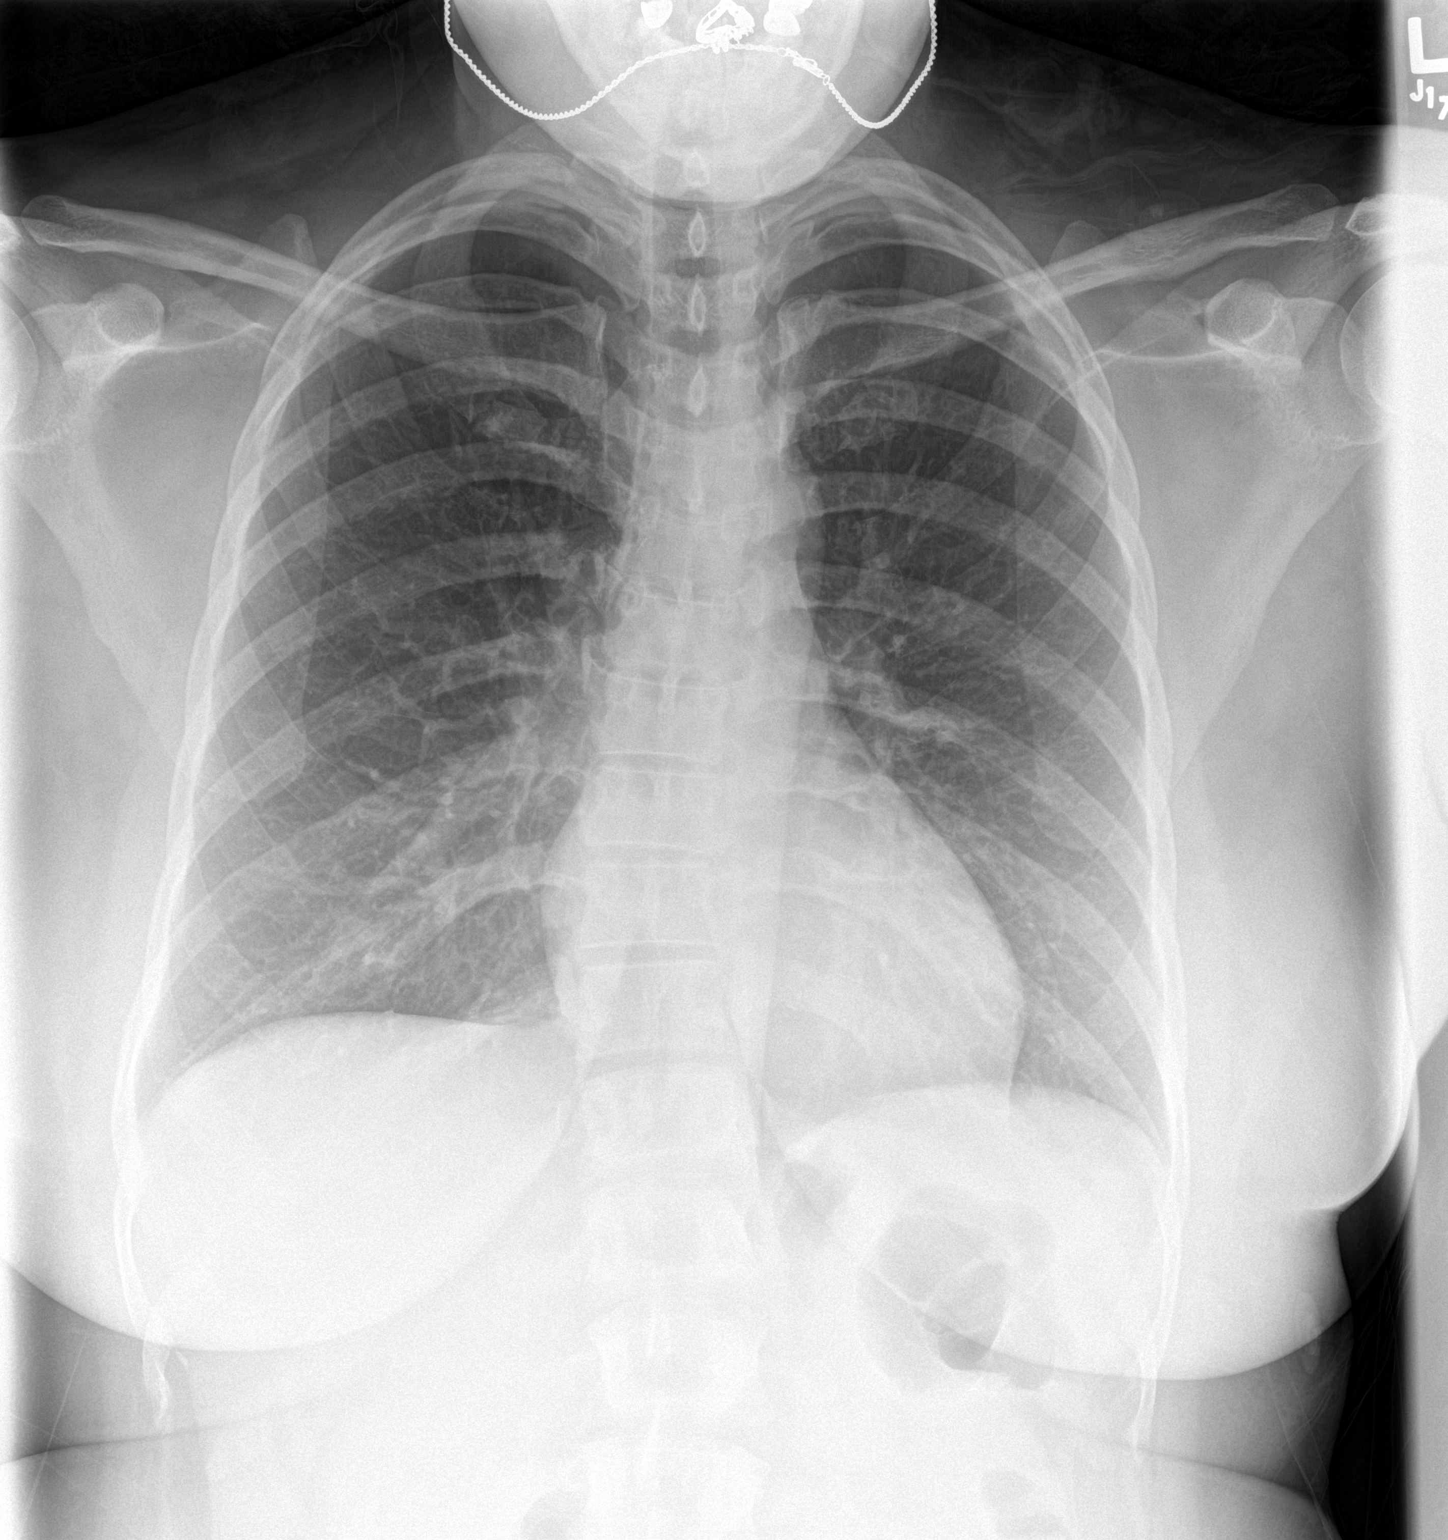

[chest lat]
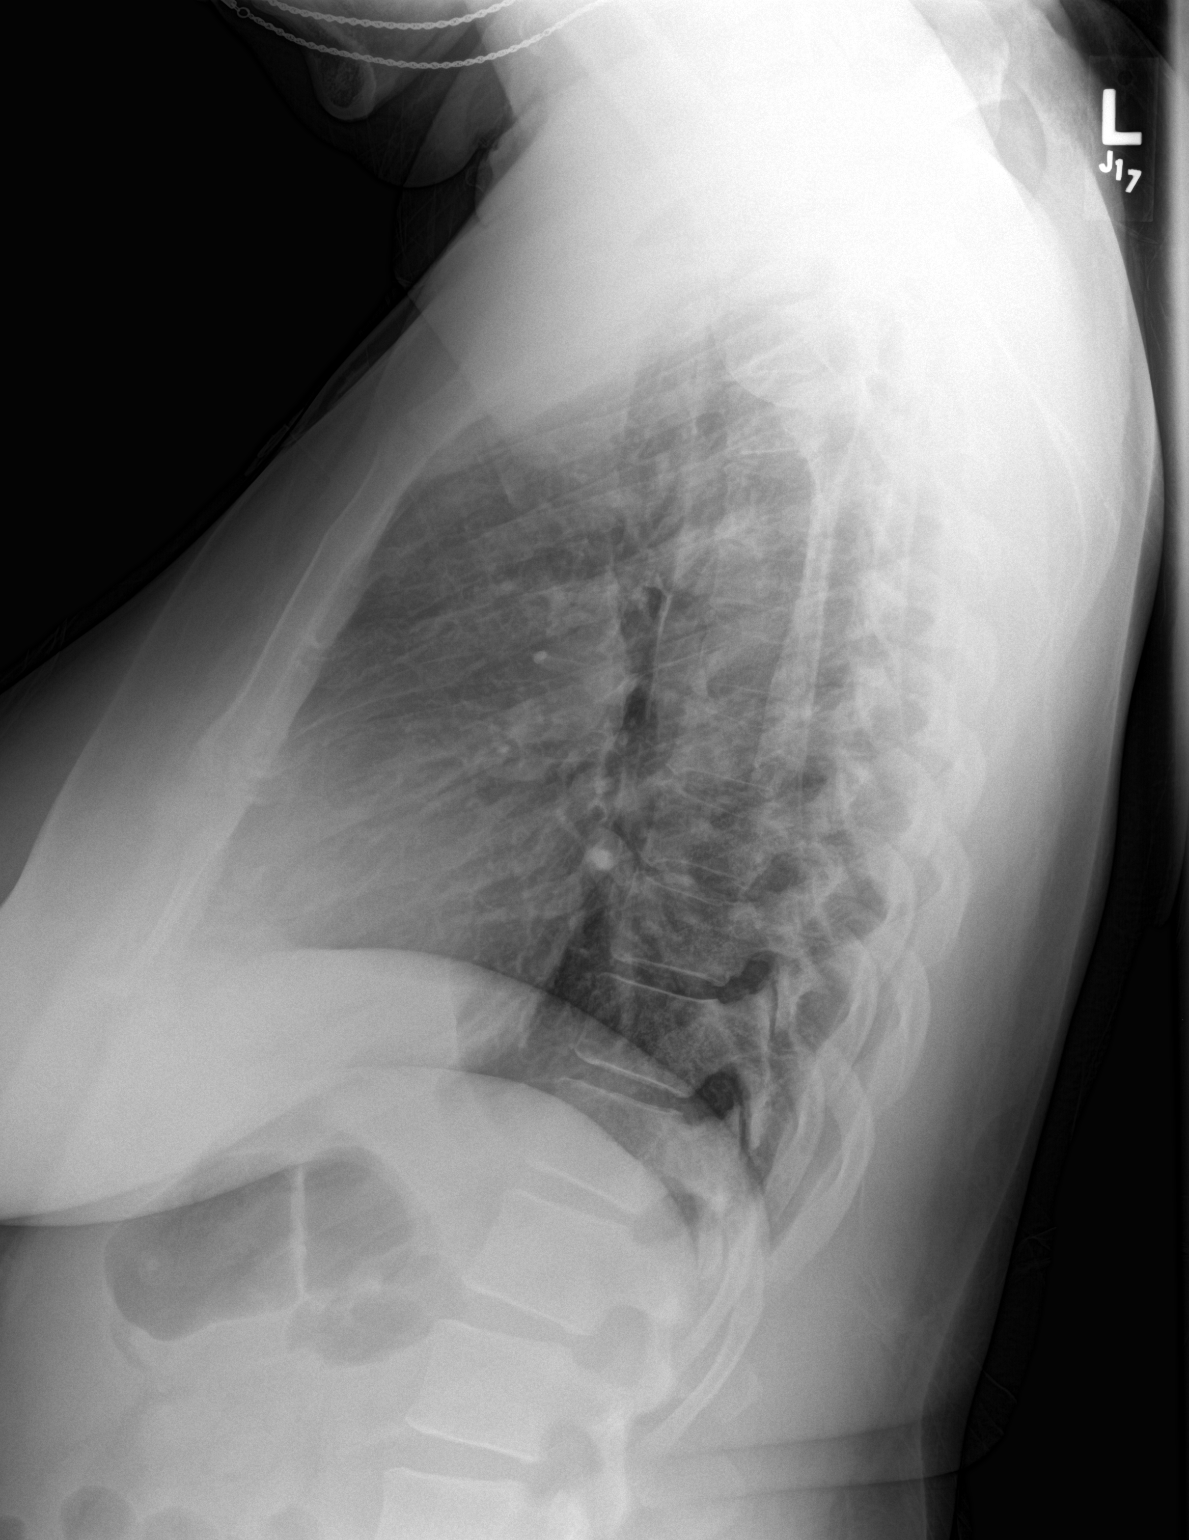

[2 of 2 positions shown; findings below may reference images not displayed]

FINDINGS: Lung volumes remain normal. Normal cardiac size and mediastinal
contours. Visualized tracheal air column is within normal limits.
Lung parenchyma stable and clear. No pneumothorax or plural
effusion. Stable scoliosis. No acute osseous abnormality identified.
IMPRESSION: No acute cardiopulmonary abnormality.

## 2015-10-22 DIAGNOSIS — F411 Generalized anxiety disorder: Secondary | ICD-10-CM | POA: Diagnosis not present

## 2015-11-18 ENCOUNTER — Encounter (HOSPITAL_COMMUNITY): Payer: Self-pay

## 2015-11-18 ENCOUNTER — Emergency Department (HOSPITAL_COMMUNITY): Payer: Federal, State, Local not specified - PPO

## 2015-11-18 ENCOUNTER — Emergency Department (HOSPITAL_COMMUNITY)
Admission: EM | Admit: 2015-11-18 | Discharge: 2015-11-18 | Disposition: A | Discharge status: Home / Self Care | Payer: Federal, State, Local not specified - PPO | Attending: Emergency Medicine | Admitting: Emergency Medicine

## 2015-11-18 DIAGNOSIS — R079 Chest pain, unspecified: Secondary | ICD-10-CM

## 2015-11-18 DIAGNOSIS — M25562 Pain in left knee: Secondary | ICD-10-CM | POA: Diagnosis not present

## 2015-11-18 DIAGNOSIS — R0789 Other chest pain: Secondary | ICD-10-CM | POA: Diagnosis not present

## 2015-11-18 LAB — BASIC METABOLIC PANEL
ANION GAP: 7 (ref 5–15)
BUN: 5 mg/dL — ABNORMAL LOW (ref 6–20)
CHLORIDE: 105 mmol/L (ref 101–111)
CO2: 26 mmol/L (ref 22–32)
CREATININE: 0.9 mg/dL (ref 0.44–1.00)
Calcium: 8.8 mg/dL — ABNORMAL LOW (ref 8.9–10.3)
GFR calc non Af Amer: >60 mL/min (ref >60)
Glucose, Bld: 81 mg/dL (ref 65–99)
Potassium: 3.7 mmol/L (ref 3.5–5.1)
SODIUM: 138 mmol/L (ref 135–145)

## 2015-11-18 LAB — CBC
HCT: 37.3 % (ref 36.0–46.0)
HEMOGLOBIN: 12 g/dL (ref 12.0–15.0)
MCH: 25.3 pg — AB (ref 26.0–34.0)
MCHC: 32.2 g/dL (ref 30.0–36.0)
MCV: 78.7 fL (ref 78.0–100.0)
PLATELETS: 357 10*3/uL (ref 150–400)
RBC: 4.74 MIL/uL (ref 3.87–5.11)
RDW: 15.2 % (ref 11.5–15.5)
WBC: 8.7 10*3/uL (ref 4.0–10.5)

## 2015-11-18 LAB — D-DIMER, QUANTITATIVE (NOT AT ARMC): D DIMER QUANT: 0.46 ug{FEU}/mL (ref 0.00–0.50)

## 2015-11-18 LAB — I-STAT TROPONIN, ED
TROPONIN I, POC: 0 ng/mL (ref 0.00–0.08)
TROPONIN I, POC: 0 ng/mL (ref 0.00–0.08)

## 2015-11-18 MED ORDER — KETOROLAC TROMETHAMINE 60 MG/2ML IM SOLN
60.0000 mg | Freq: Once | INTRAMUSCULAR | Status: AC
  Administered 2015-11-18: 60 mg via INTRAMUSCULAR
  Filled 2015-11-18: qty 2

## 2015-11-18 NOTE — ED Provider Notes (Signed)
[facility] DEPT Provider Note   CSN: [id number] Arrival date & time: 11/18/15  1839     History   Chief Complaint Chief Complaint  Patient presents with  . Chest Pain    HPI Tiffany [name] is a 40 y.o. female.  HPI  40 year old female presents with left anterior chest pain since yesterday. She states that she frequently gets intermittent chest pain that typically goes away with Klonopin and she thinks is associated with anxiety. However this feels different. It has been constant since onset yesterday. Some mild intermittent shortness of breath. Chest pain is left-sided and also goes into her left arm. Moving her left arm hurts. A little bit of nausea but no vomiting. She has a family history of her mom having a heart attack in her 77s, but denies hypertension, hyperlipidemia, diabetes or smoking. Tried BenGay and Klonopin today for the pain without relief. Has not had any leg swelling but has had some popliteal pain in her left knee since today. The pain is a sharp and heavy sensation.  Past Medical History:  Diagnosis Date  . Anemia   . Anxiety   . Depression   . PTSD (post-traumatic stress disorder)   . Umbilical hernia     Patient Active Problem List   Diagnosis Date Noted  . Allergy to penicillin 10/08/2014  . Panic disorder without agoraphobia with severe panic attacks 08/05/2014  . Depression, major, recurrent, moderate ([place]) 07/02/2014    Class: Chronic  . Generalized anxiety disorder 07/02/2014    Class: Chronic  . Right sided abdominal pain 02/20/2014  . Urinary incontinence in female 02/20/2014  . Post-operative pain 01/28/2014  . Ventral hernia 05/09/2013    Past Surgical History:  Procedure Laterality Date  . ABDOMINAL HYSTERECTOMY Bilateral 01/06/2014   Procedure: HYSTERECTOMY ABDOMINAL WITH BILATERAL SALPINGECTOMY ;  Surgeon: Delice Lesch, MD;  Location: [facility];  Service: Gynecology;  Laterality: Bilateral;  . ABDOMINAL HYSTERECTOMY  2016  .  APPENDECTOMY     2002  . CESAREAN SECTION     2003  . LAPAROSCOPIC HYSTERECTOMY N/A 01/06/2014   Procedure: Attempted  TOTAL LAPAROSCOPIC Hysterectomy,;  Surgeon: Delice Lesch, MD;  Location: [facility];  Service: Gynecology;  Laterality: N/A;  . TRANSVERSE COLON RESECTION N/A 01/06/2014   Procedure: OVERSEW OF SEROSA TEAR OF THE TRANSVERSE COLON ;  Surgeon: Alphonsa Overall, MD;  Location: [facility];  Service: General;  Laterality: N/A;  . TUBAL LIGATION     2013    OB History    Gravida Para Term Preterm AB Living   4 3 3   1 7    SAB TAB Ectopic Multiple Live Births   1       3       Home Medications    Prior to Admission medications   Medication Sig Start Date End Date Taking? Authorizing Provider  buPROPion (WELLBUTRIN SR) 150 MG 12 hr tablet Take 150 mg by mouth 2 (two) times daily.   Yes Historical Provider, MD  busPIRone (BUSPAR) 15 MG tablet Take 1 tablet by mouth 2 (two) times daily. 08/24/14  Yes Historical Provider, MD  clobetasol ointment (TEMOVATE) [id number] % Apply 1 application topically daily.    Yes Historical Provider, MD  clonazePAM (KLONOPIN) 1 MG tablet Take 1 mg by mouth daily as needed for anxiety. 02/17/15  Yes Historical Provider, MD  DULoxetine (CYMBALTA) 30 MG capsule Take 1 capsule (30 mg total) by mouth 2 (two) times daily. Patient taking differently: Take 30  mg by mouth daily.  07/07/14  Yes Merian Capron, MD  Multiple Vitamin (MULTIVITAMIN) capsule Take 1 capsule by mouth daily.   Yes Historical Provider, MD  traZODone (DESYREL) 50 MG tablet Take 1 tablet by mouth at bedtime. 08/24/14  Yes Historical Provider, MD  Vitamin D, Ergocalciferol, (DRISDOL) 50000 UNITS CAPS capsule Take 50,000 Units by mouth 2 (two) times a week.   Yes Historical Provider, MD  azithromycin (ZITHROMAX) 250 MG tablet Take 2 tabs initially, then 1 tab every day for a total of 5 days. Patient not taking: Reported on 11/18/2015 10/08/14   Donnel Saxon, CNM  oxyCODONE-acetaminophen  (PERCOCET/ROXICET) 5-325 MG per tablet Take 1-2 tablets by mouth every 4 (four) hours as needed for moderate pain. Patient not taking: Reported on 11/18/2015 10/08/14   Donnel Saxon, CNM    Family History Family History  Problem Relation Age of Onset  . Heart disease Mother 70    heart attack  . Depression Mother   . Hypertension Father 63  . Drug abuse Father   . Depression Maternal Aunt   . Anxiety disorder Maternal Aunt   . Anxiety disorder Paternal Grandmother     Social History Social History  Substance Use Topics  . Smoking status: Never Smoker  . Smokeless tobacco: Never Used  . Alcohol use No     Allergies   Mango flavor; Amoxicillin; and Penicillins   Review of Systems Review of Systems  Constitutional: Negative for fever.  Respiratory: Positive for shortness of breath.   Cardiovascular: Positive for chest pain.  Gastrointestinal: Positive for nausea. Negative for abdominal pain and vomiting.  Musculoskeletal: Negative for back pain.  All other systems reviewed and are negative.    Physical Exam Updated Vital Signs BP 129/78   Pulse 62   Temp 98.1 F (36.7 C) (Oral)   Resp 17   LMP 10/24/2013   SpO2 100%   Physical Exam  Constitutional: She is oriented to person, place, and time. She appears well-developed and well-nourished. No distress.  HENT:  Head: Normocephalic and atraumatic.  Right Ear: External ear normal.  Left Ear: External ear normal.  Nose: Nose normal.  Eyes: Right eye exhibits no discharge. Left eye exhibits no discharge.  Cardiovascular: Normal rate, regular rhythm and normal heart sounds.   Pulses:      Radial pulses are 2+ on the right side, and 2+ on the left side.       Dorsalis pedis pulses are 2+ on the right side, and 2+ on the left side.  Pulmonary/Chest: Effort normal and breath sounds normal. She exhibits no tenderness.  Abdominal: Soft. She exhibits no distension. There is no tenderness.  Musculoskeletal:       Left  knee: Tenderness (mild, posterior, no swelling) found.  No focal leg swelling  Neurological: She is alert and oriented to person, place, and time.  Skin: Skin is warm and dry. She is not diaphoretic.  Nursing note and vitals reviewed.    ED Treatments / Results  Labs (all labs ordered are listed, but only abnormal results are displayed) Labs Reviewed  BASIC METABOLIC PANEL - Abnormal; Notable for the following:       Result Value   BUN 5 (*)    Calcium 8.8 (*)    All other components within normal limits  CBC - Abnormal; Notable for the following:    MCH 25.3 (*)    All other components within normal limits  D-DIMER, QUANTITATIVE (NOT AT [facility])  I-STAT TROPOININ,  ED  Randolm Idol, ED    EKG  EKG Interpretation  Date/Time:  Thursday November 18 2015 18:42:51 EDT Ventricular Rate:  79 PR Interval:  162 QRS Duration: 74 QT Interval:  358 QTC Calculation: 410 R Axis:   70 Text Interpretation:  Normal sinus rhythm Low voltage QRS Borderline ECG no significant change since July 2016 Confirmed by Regenia Skeeter MD, Kieren Ricci [phone]) on 11/18/2015 7:29:13 PM       Radiology Dg Chest 2 View  Result Date: 11/18/2015 CLINICAL DATA:  Chest pain since last night.  Dyspnea. EXAM: CHEST  2 VIEW COMPARISON:  10/08/2014 chest radiographs FINDINGS: The heart size and mediastinal contours are within normal limits. Both lungs are clear. Stable mild dextroconvex curvature of the lower thoracic spine. IMPRESSION: No active cardiopulmonary disease. Electronically Signed   By: Ashley Royalty M.D.   On: 11/18/2015 19:10    Procedures Procedures (including critical care time)  Medications Ordered in ED Medications  ketorolac (TORADOL) injection 60 mg (60 mg Intramuscular Given 11/18/15 2044)     Initial Impression / Assessment and Plan / ED Course  I have reviewed the triage vital signs and the nursing notes.  Pertinent labs & imaging results that were available during my care of the patient  were reviewed by me and considered in my medical decision making (see chart for details).  Clinical Course  Comment By Time  Low risk for ACS. ECG benign, trop negative. Will do delta but I think ACS is unlikely. Also low risk PE but with pleuritic pain that's not reproducible and the pain behind her knee (though no obvious DVT), will get ddimer. Doubt dissection Sherwood Gambler, MD 10/26 2008    Patient with atypical left-sided chest pain. HEART score is a 1. Given her complaint of posterior knee pain, d-dimer sent but is negative. 2 negative troponins. Benign ECG. Having this is probably chest wall in etiology, she feels much better after Toradol. Continue NSAIDs at home and follow-up closely with PCP. Discussed return precautions.  Final Clinical Impressions(s) / ED Diagnoses   Final diagnoses:  Nonspecific chest pain    New Prescriptions New Prescriptions   No medications on file     Sherwood Gambler, MD 11/18/15 2313

## 2015-11-18 NOTE — ED Triage Notes (Signed)
Pt reports she has chest pain that started last night. She reports the pain has worsened today and she now has shortness of breath. Pt reports hx of palpitations.

## 2015-11-22 DIAGNOSIS — R1900 Intra-abdominal and pelvic swelling, mass and lump, unspecified site: Secondary | ICD-10-CM | POA: Diagnosis not present

## 2015-11-22 DIAGNOSIS — F419 Anxiety disorder, unspecified: Secondary | ICD-10-CM | POA: Diagnosis not present

## 2015-11-24 DIAGNOSIS — K080 Exfoliation of teeth due to systemic causes: Secondary | ICD-10-CM | POA: Diagnosis not present

## 2015-11-25 DIAGNOSIS — F411 Generalized anxiety disorder: Secondary | ICD-10-CM | POA: Diagnosis not present

## 2015-11-25 DIAGNOSIS — F330 Major depressive disorder, recurrent, mild: Secondary | ICD-10-CM | POA: Diagnosis not present

## 2015-12-23 ENCOUNTER — Encounter: Payer: Self-pay | Admitting: Nurse Practitioner

## 2015-12-23 ENCOUNTER — Other Ambulatory Visit (INDEPENDENT_AMBULATORY_CARE_PROVIDER_SITE_OTHER): Payer: Federal, State, Local not specified - PPO

## 2015-12-23 ENCOUNTER — Ambulatory Visit (INDEPENDENT_AMBULATORY_CARE_PROVIDER_SITE_OTHER)
Admission: RE | Admit: 2015-12-23 | Discharge: 2015-12-23 | Disposition: A | Discharge status: Home / Self Care | Payer: Federal, State, Local not specified - PPO | Source: Ambulatory Visit | Attending: Nurse Practitioner | Admitting: Nurse Practitioner

## 2015-12-23 ENCOUNTER — Ambulatory Visit (INDEPENDENT_AMBULATORY_CARE_PROVIDER_SITE_OTHER): Payer: Federal, State, Local not specified - PPO | Admitting: Nurse Practitioner

## 2015-12-23 VITALS — BP 110/70 | HR 79 | Temp 97.8°F | Ht 67.0 in | Wt 245.0 lb

## 2015-12-23 DIAGNOSIS — R071 Chest pain on breathing: Secondary | ICD-10-CM | POA: Diagnosis not present

## 2015-12-23 DIAGNOSIS — M79661 Pain in right lower leg: Secondary | ICD-10-CM | POA: Diagnosis not present

## 2015-12-23 DIAGNOSIS — R079 Chest pain, unspecified: Secondary | ICD-10-CM | POA: Diagnosis not present

## 2015-12-23 DIAGNOSIS — M7989 Other specified soft tissue disorders: Secondary | ICD-10-CM

## 2015-12-23 DIAGNOSIS — Z0001 Encounter for general adult medical examination with abnormal findings: Secondary | ICD-10-CM | POA: Diagnosis not present

## 2015-12-23 DIAGNOSIS — F8081 Childhood onset fluency disorder: Secondary | ICD-10-CM | POA: Insufficient documentation

## 2015-12-23 LAB — COMPREHENSIVE METABOLIC PANEL
ALK PHOS: 79 U/L (ref 39–117)
ALT: 19 U/L (ref 0–35)
AST: 17 U/L (ref 0–37)
Albumin: 4.3 g/dL (ref 3.5–5.2)
BILIRUBIN TOTAL: 0.4 mg/dL (ref 0.2–1.2)
BUN: 12 mg/dL (ref 6–23)
CALCIUM: 9.7 mg/dL (ref 8.4–10.5)
CO2: 31 mEq/L (ref 19–32)
CREATININE: 0.89 mg/dL (ref 0.40–1.20)
Chloride: 102 mEq/L (ref 96–112)
GFR: 89.68 mL/min (ref >60.00)
Glucose, Bld: 95 mg/dL (ref 70–99)
Potassium: 4.3 mEq/L (ref 3.5–5.1)
Sodium: 139 mEq/L (ref 135–145)
TOTAL PROTEIN: 8.1 g/dL (ref 6.0–8.3)

## 2015-12-23 LAB — CBC WITH DIFFERENTIAL/PLATELET
BASOS ABS: 0 10*3/uL (ref 0.0–0.1)
Basophils Relative: 0.2 % (ref 0.0–3.0)
EOS PCT: 1.7 % (ref 0.0–5.0)
Eosinophils Absolute: 0.2 10*3/uL (ref 0.0–0.7)
HCT: 38.5 % (ref 36.0–46.0)
Hemoglobin: 12.6 g/dL (ref 12.0–15.0)
LYMPHS ABS: 1.9 10*3/uL (ref 0.7–4.0)
Lymphocytes Relative: 19.2 % (ref 12.0–46.0)
MCHC: 32.7 g/dL (ref 30.0–36.0)
MCV: 78.1 fl (ref 78.0–100.0)
MONO ABS: 0.5 10*3/uL (ref 0.1–1.0)
Monocytes Relative: 5.6 % (ref 3.0–12.0)
NEUTROS ABS: 7.2 10*3/uL (ref 1.4–7.7)
NEUTROS PCT: 73.3 % (ref 43.0–77.0)
PLATELETS: 357 10*3/uL (ref 150.0–400.0)
RBC: 4.93 Mil/uL (ref 3.87–5.11)
RDW: 16 % — ABNORMAL HIGH (ref 11.5–15.5)
WBC: 9.8 10*3/uL (ref 4.0–10.5)

## 2015-12-23 LAB — LIPID PANEL
CHOL/HDL RATIO: 5
Cholesterol: 211 mg/dL — ABNORMAL HIGH (ref 0–200)
HDL: 40.4 mg/dL (ref >39.00)
LDL Cholesterol: 145 mg/dL — ABNORMAL HIGH (ref 0–99)
NONHDL: 170.92
TRIGLYCERIDES: 131 mg/dL (ref 0.0–149.0)
VLDL: 26.2 mg/dL (ref 0.0–40.0)

## 2015-12-23 LAB — TSH: TSH: 1.76 u[IU]/mL (ref 0.35–4.50)

## 2015-12-23 MED ORDER — RIVAROXABAN 15 MG PO TABS: 15.0000 mg | ORAL_TABLET | Freq: Two times a day (BID) | ORAL | 0 refills | Status: DC

## 2015-12-23 NOTE — Progress Notes (Signed)
Subjective:    Patient ID: Tiffany [name], female    DOB: October 17, 1975, 40 y.o.   MRN: [id number]  Patient presents today for complete physical or establish care (new patient), chest pain and leg swelling  Leg Pain   The incident occurred 5 to 7 days ago. There was no injury mechanism. The pain is present in the right leg and right knee. The quality of the pain is described as aching and burning (tightness). The pain is severe. The pain has been constant since onset. Associated symptoms include an inability to bear weight. Pertinent negatives include no loss of motion, loss of sensation, muscle weakness or tingling. The symptoms are aggravated by palpation, weight bearing and movement. She has tried nothing for the symptoms.  Chest Pain   This is a new problem. The current episode started in the past 7 days. The onset quality is sudden. The problem occurs constantly. The problem has been unchanged. The quality of the pain is described as tightness, dull and burning. The pain does not radiate. Associated symptoms include leg pain and lower extremity edema. Pertinent negatives include no abdominal pain, back pain, cough, diaphoresis, dizziness, exertional chest pressure, fever, headaches, hemoptysis, irregular heartbeat, malaise/fatigue, nausea, orthopnea, palpitations, PND, shortness of breath or vomiting. Risk factors include post-menopausal, obesity, lack of exercise and sedentary lifestyle.  onset of leg pain and chest pain after car travel to [place] and [place] last week.  Immunizations: (TDAP, Hep C screen, Pneumovax, Influenza, zoster)  Health Maintenance  Topic Date Due  . Pap Smear  07/02/1995  . Flu Shot  04/22/2016*  . HIV Screening  01/23/2017*  . Tetanus Vaccine  08/06/2021  *Topic was postponed. The date shown is not the original due date.   Diet:regular Weight: knows she needs to loose wight Wt Readings from Last 3 Encounters:  12/23/15 245 lb (111.1 kg)  09/05/14 237 lb (107.5 kg)    08/05/14 236 lb (107 kg)   Exercise:none Fall Risk: Fall Risk  12/23/2015  Falls in the past year? No   Home Safety:home with husband Depression/Suicide: Depression screen [facility] 2/9 12/23/2015  Decreased Interest 3  Down, Depressed, Hopeless 3  PHQ - 2 Score 6  Altered sleeping 3  Tired, decreased energy 3  Change in appetite 2  Feeling bad or failure about yourself  0  Trouble concentrating 1  Moving slowly or fidgety/restless 0  Suicidal thoughts 0  PHQ-9 Score 15   No flowsheet data found. Pap Smear (every 31yr for >21-29 without HPV, every 534yrfor >30-6568yrith HPV):up to date Mammogram (yearly, >45y60yrast done 2016 (normal), has nother one scheduled for 12/2015,  Advanced Directive: Advanced Directives 11/18/2015  Does Patient Have a Medical Advance Directive? No  Would patient like information on creating a medical advance directive? No - patient declined information     Medications and allergies reviewed with patient and updated if appropriate.  Patient Active Problem List   Diagnosis Date Noted  . Encounter for preventative adult health care exam with abnormal findings 12/23/2015  . Stuttering 12/23/2015  . Allergy to penicillin 10/08/2014  . Panic disorder without agoraphobia with severe panic attacks 08/05/2014  . Depression, major, recurrent, moderate (HCC)[place]/09/2014    Class: Chronic  . Generalized anxiety disorder 07/02/2014    Class: Chronic  . Right sided abdominal pain 02/20/2014  . Urinary incontinence in female 02/20/2014  . Post-operative pain 01/28/2014  . Ventral hernia 05/09/2013    Current Outpatient Prescriptions on File Prior to Visit  Medication Sig Dispense Refill  . buPROPion (WELLBUTRIN SR) 150 MG 12 hr tablet Take 150 mg by mouth 2 (two) times daily.    . busPIRone (BUSPAR) 15 MG tablet Take 1 tablet by mouth 2 (two) times daily.    . clobetasol ointment (TEMOVATE) 7.01 % Apply 1 application topically daily.     . clonazePAM  (KLONOPIN) 1 MG tablet Take 1 mg by mouth daily as needed for anxiety.    . Multiple Vitamin (MULTIVITAMIN) capsule Take 1 capsule by mouth daily.    . traZODone (DESYREL) 50 MG tablet Take 1 tablet by mouth at bedtime.    . DULoxetine (CYMBALTA) 30 MG capsule Take 1 capsule (30 mg total) by mouth 2 (two) times daily. (Patient not taking: Reported on 12/23/2015) 60 capsule 0   No current facility-administered medications on file prior to visit.     Past Medical History:  Diagnosis Date  . Anemia   . Anxiety   . Chicken pox 1981  . Depression   . GERD (gastroesophageal reflux disease)   . Hay fever 2015  . PTSD (post-traumatic stress disorder)   . Umbilical hernia     Past Surgical History:  Procedure Laterality Date  . ABDOMINAL HYSTERECTOMY Bilateral 01/06/2014   Procedure: HYSTERECTOMY ABDOMINAL WITH BILATERAL SALPINGECTOMY ;  Surgeon: Delice Lesch, MD;  Location: [facility];  Service: Gynecology;  Laterality: Bilateral;  . ABDOMINAL HYSTERECTOMY  2016  . APPENDECTOMY     2002  . CESAREAN SECTION     2003  . LAPAROSCOPIC HYSTERECTOMY N/A 01/06/2014   Procedure: Attempted  TOTAL LAPAROSCOPIC Hysterectomy,;  Surgeon: Delice Lesch, MD;  Location: [facility];  Service: Gynecology;  Laterality: N/A;  . TRANSVERSE COLON RESECTION N/A 01/06/2014   Procedure: OVERSEW OF SEROSA TEAR OF THE TRANSVERSE COLON ;  Surgeon: Alphonsa Overall, MD;  Location: [facility];  Service: General;  Laterality: N/A;  . TUBAL LIGATION     2013    Social History   Social History  . Marital status: Married    Spouse name: N/A  . Number of children: N/A  . Years of education: N/A   Social History Main Topics  . Smoking status: Never Smoker  . Smokeless tobacco: Never Used  . Alcohol use No  . Drug use: No  . Sexual activity: Yes   Other Topics Concern  . None   Social History Narrative   Work or School: works for [employer]:  lives with husband and 4 children (12-20yo), husband is supportive "wonderful"      Spiritual Beliefs: Christian      Lifestyle: walks 3 times per week; working on diet             Family History  Problem Relation Age of Onset  . Heart disease Mother 39    heart attack  . Depression Mother   . Hyperlipidemia Mother   . Hypertension Father 61  . Drug abuse Father   . Depression Maternal Aunt   . Anxiety disorder Maternal Aunt   . Anxiety disorder Paternal Grandmother   . Arthritis Paternal Grandmother   . Depression Paternal Grandmother   . Depression Maternal Grandmother   . Depression Maternal Grandfather   . Depression Paternal Grandfather         Review of Systems  Constitutional: Negative for diaphoresis, fever and malaise/fatigue.  HENT: Negative for ear pain, hearing loss, sore throat and  tinnitus.   Respiratory: Negative for cough, hemoptysis and shortness of breath.   Cardiovascular: Positive for chest pain. Negative for palpitations, orthopnea and PND.  Gastrointestinal: Negative for abdominal pain, blood in stool, constipation, diarrhea, heartburn, melena, nausea and vomiting.  Genitourinary: Negative for dysuria, flank pain, frequency, hematuria and urgency.  Musculoskeletal: Negative for back pain.  Skin: Negative for itching and rash.  Neurological: Negative for dizziness, tingling, sensory change and headaches.  Endo/Heme/Allergies: Negative for environmental allergies and polydipsia. Does not bruise/bleed easily.  Psychiatric/Behavioral: Positive for depression. Negative for memory loss, substance abuse and suicidal ideas. The patient is nervous/anxious. The patient does not have insomnia.     Objective:   Vitals:   12/23/15 1522  BP: 110/70  Pulse: 79  Temp: 97.8 F (36.6 C)    Body mass index is 38.37 kg/m.  ECG 12/23/15: NSR, no change compared to previous ECG 10/2014.  Physical Examination:  Physical Exam  Constitutional: She is  oriented to person, place, and time. No distress.  HENT:  Right Ear: External ear normal.  Left Ear: External ear normal.  Nose: Nose normal.  Mouth/Throat: Oropharynx is clear and moist. No oropharyngeal exudate.  Eyes: Conjunctivae and EOM are normal. Pupils are equal, round, and reactive to light. No scleral icterus.  Neck: Normal range of motion. Neck supple.  Cardiovascular: Normal rate, regular rhythm and normal heart sounds.   Pulmonary/Chest: Effort normal and breath sounds normal. No respiratory distress.  Abdominal: Soft. Bowel sounds are normal.  Musculoskeletal: She exhibits edema and tenderness.       Right hip: Normal.       Right knee: She exhibits decreased range of motion and swelling. She exhibits no effusion.       Right lower leg: She exhibits tenderness and edema.       Right foot: Normal.  Difficulty with right knee flexion.  Lymphadenopathy:    She has no cervical adenopathy.  Neurological: She is alert and oriented to person, place, and time.  Skin: Skin is warm and dry. No rash noted. No erythema.  Psychiatric: Affect and judgment normal.  Vitals reviewed.   ASSESSMENT and PLAN:  Delenn was seen today for establish care.  Diagnoses and all orders for this visit:  Chest pain on breathing -     DG Chest 2 View; Future -     EKG 12-Lead -     D-dimer, quantitative (not at [facility]); Future  Encounter for preventative adult health care exam with abnormal findings -     CBC w/Diff; Future -     Comprehensive metabolic panel; Future -     Lipid panel; Future -     TSH; Future  Pain and swelling of right lower leg -     VAS [place] LOWER EXTREMITY VENOUS (DVT); Future -     Discontinue: Rivaroxaban (XARELTO) 15 MG TABS tablet; Take 1 tablet (15 mg total) by mouth 2 (two) times daily with a meal. -     EKG 12-Lead -     D-dimer, quantitative (not at [facility]); Future    No problem-specific Assessment & Plan notes found for this encounter.    Recent Results  (from the past 2160 hour(s))  Basic metabolic panel     Status: Abnormal   Collection Time: 11/18/15  6:54 PM  Result Value Ref Range   Sodium 138 135 - 145 mmol/L   Potassium 3.7 3.5 - 5.1 mmol/L   Chloride 105 101 - 111 mmol/L  CO2 26 22 - 32 mmol/L   Glucose, Bld 81 65 - 99 mg/dL   BUN 5 (L) 6 - 20 mg/dL   Creatinine, Ser 0.90 0.44 - 1.00 mg/dL   Calcium 8.8 (L) 8.9 - 10.3 mg/dL   GFR calc non Af Amer >60 >60 mL/min   GFR calc Af Amer >60 >60 mL/min    Comment: (NOTE) The eGFR has been calculated using the CKD EPI equation. This calculation has not been validated in all clinical situations. eGFR's persistently <60 mL/min signify possible Chronic Kidney Disease.    Anion gap 7 5 - 15  CBC     Status: Abnormal   Collection Time: 11/18/15  6:54 PM  Result Value Ref Range   WBC 8.7 4.0 - 10.5 K/uL   RBC 4.74 3.87 - 5.11 MIL/uL   Hemoglobin 12.0 12.0 - 15.0 g/dL   HCT 37.3 36.0 - 46.0 %   MCV 78.7 78.0 - 100.0 fL   MCH 25.3 (L) 26.0 - 34.0 pg   MCHC 32.2 30.0 - 36.0 g/dL   RDW 15.2 11.5 - 15.5 %   Platelets 357 150 - 400 K/uL  I-stat troponin, ED     Status: None   Collection Time: 11/18/15  7:02 PM  Result Value Ref Range   Troponin i, poc 0.00 0.00 - 0.08 ng/mL   Comment 3            Comment: Due to the release kinetics of cTnI, a negative result within the first hours of the onset of symptoms does not rule out myocardial infarction with certainty. If myocardial infarction is still suspected, repeat the test at appropriate intervals.   D-dimer, quantitative     Status: None   Collection Time: 11/18/15  8:42 PM  Result Value Ref Range   D-Dimer, Quant 0.46 0.00 - 0.50 ug/mL-FEU    Comment: (NOTE) At the manufacturer cut-off of 0.50 ug/mL FEU, this assay has been documented to exclude PE with a sensitivity and negative predictive value of 97 to 99%.  At this time, this assay has not been approved by the FDA to exclude DVT/VTE. Results should be correlated with  clinical presentation.   I-stat troponin, ED     Status: None   Collection Time: 11/18/15 10:15 PM  Result Value Ref Range   Troponin i, poc 0.00 0.00 - 0.08 ng/mL   Comment 3            Comment: Due to the release kinetics of cTnI, a negative result within the first hours of the onset of symptoms does not rule out myocardial infarction with certainty. If myocardial infarction is still suspected, repeat the test at appropriate intervals.   CBC w/Diff     Status: Abnormal   Collection Time: 12/23/15  4:32 PM  Result Value Ref Range   WBC 9.8 4.0 - 10.5 K/uL   RBC 4.93 3.87 - 5.11 Mil/uL   Hemoglobin 12.6 12.0 - 15.0 g/dL   HCT 38.5 36.0 - 46.0 %   MCV 78.1 78.0 - 100.0 fl   MCHC 32.7 30.0 - 36.0 g/dL   RDW 16.0 (H) 11.5 - 15.5 %   Platelets 357.0 150.0 - 400.0 K/uL   Neutrophils Relative % 73.3 43.0 - 77.0 %   Lymphocytes Relative 19.2 12.0 - 46.0 %   Monocytes Relative 5.6 3.0 - 12.0 %   Eosinophils Relative 1.7 0.0 - 5.0 %   Basophils Relative 0.2 0.0 - 3.0 %   Neutro Abs  7.2 1.4 - 7.7 K/uL   Lymphs Abs 1.9 0.7 - 4.0 K/uL   Monocytes Absolute 0.5 0.1 - 1.0 K/uL   Eosinophils Absolute 0.2 0.0 - 0.7 K/uL   Basophils Absolute 0.0 0.0 - 0.1 K/uL  Comprehensive metabolic panel     Status: None   Collection Time: 12/23/15  4:32 PM  Result Value Ref Range   Sodium 139 135 - 145 mEq/L   Potassium 4.3 3.5 - 5.1 mEq/L   Chloride 102 96 - 112 mEq/L   CO2 31 19 - 32 mEq/L   Glucose, Bld 95 70 - 99 mg/dL   BUN 12 6 - 23 mg/dL   Creatinine, Ser 0.89 0.40 - 1.20 mg/dL   Total Bilirubin 0.4 0.2 - 1.2 mg/dL   Alkaline Phosphatase 79 39 - 117 U/L   AST 17 0 - 37 U/L   ALT 19 0 - 35 U/L   Total Protein 8.1 6.0 - 8.3 g/dL   Albumin 4.3 3.5 - 5.2 g/dL   Calcium 9.7 8.4 - 10.5 mg/dL   GFR 89.68 >60.00 mL/min  Lipid panel     Status: Abnormal   Collection Time: 12/23/15  4:32 PM  Result Value Ref Range   Cholesterol 211 (H) 0 - 200 mg/dL    Comment: ATP III Classification        Desirable:  < 200 mg/dL               Borderline High:  200 - 239 mg/dL          High:  > = 240 mg/dL   Triglycerides 131.0 0.0 - 149.0 mg/dL    Comment: Normal:  <150 mg/dLBorderline High:  150 - 199 mg/dL   HDL 40.40 >39.00 mg/dL   VLDL 26.2 0.0 - 40.0 mg/dL   LDL Cholesterol 145 (H) 0 - 99 mg/dL   Total CHOL/HDL Ratio 5     Comment:                Men          Women1/2 Average Risk     3.4          3.3Average Risk          5.0          4.42X Average Risk          9.6          7.13X Average Risk          15.0          11.0                       NonHDL 170.92     Comment: NOTE:  Non-HDL goal should be 30 mg/dL higher than patient's LDL goal (i.e. LDL goal of < 70 mg/dL, would have non-HDL goal of < 100 mg/dL)  TSH     Status: None   Collection Time: 12/23/15  4:32 PM  Result Value Ref Range   TSH 1.76 0.35 - 4.50 uIU/mL  D-dimer, quantitative (not at [facility])     Status: None   Collection Time: 12/23/15  4:32 PM  Result Value Ref Range   D-DIMER 0.47 0.00 - 0.49 mg/L FEU    Comment: According to the assay manufacturer's published package insert, a normal (<0.50 mg/L FEU) D-dimer result in conjunction with a non-high clinical probability assessment, excludes deep vein thrombosis (DVT) and pulmonary embolism (PE) with high sensitivity. D-dimer values increase  with age and this can make VTE exclusion of an older population difficult. To address this, the [place], based on best available evidence and recent guidelines, recommends that clinicians use age-adjusted D-dimer thresholds in patients greater than 60 years of age with: a) a low probability of PE who do not meet all Pulmonary Embolism Rule Out Criteria, or b) in those with intermediate probability of PE. The formula for an age-adjusted D-dimer cut-off is "age/100". For example, a 41 year old patient would have an age-adjusted cut-off of 0.60 mg/L FEU and an 41 year old 0.80 mg/L FEU.    Follow up: Return in  about 2 weeks (around 01/06/2016) for anxiety and depression.  [name], NP

## 2015-12-23 NOTE — Progress Notes (Signed)
Normal results, see office note

## 2015-12-23 NOTE — Patient Instructions (Addendum)
Patient notified about lab results and venous doppler results. She is to stop Xarelto. Wear knee sleeve during the day and off at night. Use naproxen as needed for pain. Patient declined referral to ortho at this time Follow up in 2weeks  Bleeding Precautions When on Anticoagulant Therapy WHAT IS ANTICOAGULANT THERAPY? Anticoagulant therapy is taking medicine to prevent or reduce blood clots. It is also called blood thinner therapy. Blood clots that form in your blood vessels can be dangerous. They can break loose and travel to your heart, lungs, or brain. This increases your risk of a heart attack or stroke. Anticoagulant therapy causes blood to clot more slowly. You may need anticoagulant therapy if you have:  A medical condition that increases the likelihood that blood clots will form.  A heart defect or a problem with heart rhythm. It is also a common treatment after heart surgery, such as valve replacement. WHAT ARE COMMON TYPES OF ANTICOAGULANT THERAPY? Anticoagulant medicine can be injected or taken by mouth.If you need anticoagulant therapy quickly at the hospital, the medicine may be injected under your skin or given through an IV tube. Heparin is a common example of an anticoagulant that you may get at the hospital. Most anticoagulant therapy is in the form of pills that you take at home every day. These may include:  Aspirin. This common blood thinner works by preventing blood cells (platelets) from sticking together to form a clot. Aspirin is not as strong as anticoagulants that slow down the time that it takes for your body to form a clot.  Clopidogrel. This is a newer type of drug that affects platelets. It is stronger than aspirin.  Warfarin. This is the most common anticoagulant. It changes the way your body uses vitamin K, a vitamin that helps your blood to clot. The risk of bleeding is higher with warfarin than with aspirin. You will need frequent blood tests to make sure  you are taking the safest amount.  New anticoagulants. Several new drugs have been approved. They are all taken by mouth. Studies show that these drugs work as well as warfarin. They do not require blood testing. They may cause less bleeding risk than warfarin. WHAT DO I NEED TO REMEMBER WHEN TAKING ANTICOAGULANT THERAPY? Anticoagulant therapy decreases your risk of forming a blood clot, but it increases your risk of bleeding. Work closely with your health care provider to make sure you are taking your medicine safely. These tips can help:  Learn ways to reduce your risk of bleeding.  If you are taking warfarin:  Have blood tests as ordered by your health care provider.  Do not make any sudden changes to your diet. Vitamin K in your diet can make warfarin less effective.  Do not get pregnant. This medicine may cause birth defects.  Take your medicine at the same time every day. If you forget to take your medicine, take it as soon as you remember. If you miss a whole day, do not double your dose of medicine. Take your normal dose and call your health care provider to check in.  Do not stop taking your medicine on your own.  Tell your health care provider before you start taking any new medicine, vitamin, or herbal product. Some of these could interfere with your therapy.  Tell all of your health care providers that you are on anticoagulant therapy.  Do not have surgery, medical procedures, or dental work until you tell your health care provider that you are  on anticoagulant therapy. WHAT CAN AFFECT HOW ANTICOAGULANTS WORK? Certain foods, vitamins, medicines, supplements, and herbal medicines change the way that anticoagulant therapy works. They may increase or decrease the effects of your anticoagulant therapy. Either result can be dangerous for you.  Many over-the-counter medicines for pain, colds, or stomach problems interfere with anticoagulant therapy. Take these only as told by your  health care provider.  Do not drink alcohol. It can interfere with your medicine and increase your risk of an injury that causes bleeding.  If you are taking warfarin, do not begin eating more foods that contain vitamin K. These include leafy green vegetables. Ask your health care provider if you should avoid any foods. WHAT ARE SOME WAYS TO PREVENT BLEEDING? You can prevent bleeding by taking certain precautions:  Be extra careful when you use knives, scissors, or other sharp objects.  Use an electric razor instead of a blade.  Do not use toothpicks.  Use a soft toothbrush.  Wear shoes that have nonskid soles.  Use bath mats and handrails in your bathroom.  Wear gloves while you do yard work.  Wear a helmet when you ride a bike.  Wear your seat belt.  Prevent falls by removing loose rugs and extension cords from areas where you walk.  Do not play contact sports or participate in other activities that have a high risk of injury. [place] PROVIDER? Call your health care provider if:  You miss a dose of medicine:  And you are not sure what to do.  For more than one day.  You have:  Menstrual bleeding that is heavier than normal.  Blood in your urine.  A bloody nose or bleeding gums.  Easy bruising.  Blood in your stool (feces) or have black and tarry stool.  Side effects from your medicine.  You feel weak or dizzy.  You become pregnant. Seek immediate medical care if:  You have bleeding that will not stop.  You have sudden and severe headache or belly pain.  You vomit or you cough up bright red blood.  You have a severe blow to your head. WHAT ARE SOME QUESTIONS TO ASK MY HEALTH CARE PROVIDER?  What is the best anticoagulant therapy for my condition?  What side effects should I watch for?  When should I take my medicine? What should I do if I forget to take it?  Will I need to have regular blood tests?  Do I need to  change my diet? Are there foods or drinks that I should avoid?  What activities are safe for me?  What should I do if I want to get pregnant? This information is not intended to replace advice given to you by your health care provider. Make sure you discuss any questions you have with your health care provider. Document Released: 12/21/2014 Document Reviewed: 12/21/2014 Elsevier Interactive Patient Education  2017 [employer].

## 2015-12-23 NOTE — Progress Notes (Signed)
Pre visit review using our clinic review tool, if applicable. No additional management support is needed unless otherwise documented below in the visit note. 

## 2015-12-24 ENCOUNTER — Ambulatory Visit (HOSPITAL_COMMUNITY)
Admission: RE | Admit: 2015-12-24 | Discharge: 2015-12-24 | Disposition: A | Discharge status: Home / Self Care | Payer: Federal, State, Local not specified - PPO | Source: Ambulatory Visit | Attending: Nurse Practitioner | Admitting: Nurse Practitioner

## 2015-12-24 DIAGNOSIS — M7989 Other specified soft tissue disorders: Secondary | ICD-10-CM

## 2015-12-24 DIAGNOSIS — M79661 Pain in right lower leg: Secondary | ICD-10-CM

## 2015-12-24 LAB — D-DIMER, QUANTITATIVE (NOT AT ARMC): D-DIMER: 0.47 mg{FEU}/L (ref 0.00–0.49)

## 2015-12-24 NOTE — Progress Notes (Signed)
VASCULAR LAB PRELIMINARY  PRELIMINARY  PRELIMINARY  PRELIMINARY  Right lower extremity venous duplex completed.    Preliminary report:  Right:  No evidence of DVT, superficial thrombosis, or Baker's cyst.  Sakina Briones, RVS 12/24/2015, 10:29 AM

## 2015-12-28 DIAGNOSIS — Z6839 Body mass index (BMI) 39.0-39.9, adult: Secondary | ICD-10-CM | POA: Diagnosis not present

## 2015-12-28 DIAGNOSIS — N760 Acute vaginitis: Secondary | ICD-10-CM | POA: Diagnosis not present

## 2015-12-28 DIAGNOSIS — Z01419 Encounter for gynecological examination (general) (routine) without abnormal findings: Secondary | ICD-10-CM | POA: Diagnosis not present

## 2015-12-28 DIAGNOSIS — Z139 Encounter for screening, unspecified: Secondary | ICD-10-CM | POA: Diagnosis not present

## 2015-12-28 DIAGNOSIS — E559 Vitamin D deficiency, unspecified: Secondary | ICD-10-CM | POA: Diagnosis not present

## 2015-12-28 DIAGNOSIS — Z1231 Encounter for screening mammogram for malignant neoplasm of breast: Secondary | ICD-10-CM | POA: Diagnosis not present

## 2015-12-28 DIAGNOSIS — R300 Dysuria: Secondary | ICD-10-CM | POA: Diagnosis not present

## 2016-01-04 ENCOUNTER — Encounter (HOSPITAL_COMMUNITY): Payer: Self-pay | Admitting: Emergency Medicine

## 2016-01-04 ENCOUNTER — Ambulatory Visit (HOSPITAL_COMMUNITY)
Admission: EM | Admit: 2016-01-04 | Discharge: 2016-01-04 | Disposition: A | Discharge status: Home / Self Care | Payer: Federal, State, Local not specified - PPO | Attending: Emergency Medicine | Admitting: Emergency Medicine

## 2016-01-04 ENCOUNTER — Ambulatory Visit: Payer: Federal, State, Local not specified - PPO | Admitting: Nurse Practitioner

## 2016-01-04 DIAGNOSIS — J0100 Acute maxillary sinusitis, unspecified: Secondary | ICD-10-CM

## 2016-01-04 DIAGNOSIS — R69 Illness, unspecified: Secondary | ICD-10-CM | POA: Diagnosis not present

## 2016-01-04 DIAGNOSIS — J111 Influenza due to unidentified influenza virus with other respiratory manifestations: Secondary | ICD-10-CM

## 2016-01-04 MED ORDER — OSELTAMIVIR PHOSPHATE 75 MG PO CAPS: 75.0000 mg | ORAL_CAPSULE | Freq: Two times a day (BID) | ORAL | 0 refills | Status: DC

## 2016-01-04 MED ORDER — HYDROCOD POLST-CPM POLST ER 10-8 MG/5ML PO SUER: 5.0000 mL | Freq: Two times a day (BID) | ORAL | 0 refills | Status: DC | PRN

## 2016-01-04 MED ORDER — MOMETASONE FUROATE 50 MCG/ACT NA SUSP: 2.0000 | Freq: Every day | NASAL | 0 refills | Status: DC

## 2016-01-04 NOTE — ED Triage Notes (Signed)
Pt was experiencing a sore throat/neck yesterday.  She has since developed nasal/head congestion, cough, body aches and fatigue.  She does not know if she has had a fever at home but she is afebrile here today.  Her last does of Motrin was this morning about 1000.

## 2016-01-04 NOTE — Discharge Instructions (Signed)
You may take 800 mg of motrin with 1 gram of tylenol up to 3 times a day as needed for pain. This is an effective combination for pain.  Most sinus infections are viral and do not need antibiotics unless you have a high fever, have had this for 10 days, or you get better and then get sick again. Use a neti pot or the NeilMed sinus rinse as often as you want to to reduce nasal congestion. Follow the directions on the box.   Go to www.goodrx.com to look up your medications. This will give you a list of where you can find your prescriptions at the most affordable prices.

## 2016-01-04 NOTE — ED Provider Notes (Signed)
HPI  SUBJECTIVE:  Tiffany [name] is a 40 y.o. female who presents with the acute onset of body aches, frontal headache, sinus pain or pressure, sore throat, nasal congestion, rhinorrhea, post nasal drip, fatigue and cough starting yesterday. Patient states that she feels hot with chills, but has no documented fevers at home. She tried ibuprofen 600 mg and Mucinex without improvement in her symptoms. There are no other aggravating or alleviating factors. She reports 2 loose stools today associated with crampy abdominal pain before having a bowel movement which then resolves, no other abdominal pain.  states that she can't sleep at night secondary to the cough. She denies wheezing, chest pain, shortness of breath, ear pain, neck stiffness, rash, upper dental pain. No nausea, vomiting. She did not get a flu shot this year. Husband with identical symptoms several days ago. Past medical history of anxiety. No history of asthma, emphysema, COPD, smoking, diabetes, hypertension. LMP: Hysterectomy. [id number] Nche, NP    Past Medical History:  Diagnosis Date  . Anemia   . Anxiety   . Chicken pox 1981  . Depression   . GERD (gastroesophageal reflux disease)   . Hay fever 2015  . PTSD (post-traumatic stress disorder)   . Umbilical hernia     Past Surgical History:  Procedure Laterality Date  . ABDOMINAL HYSTERECTOMY Bilateral 01/06/2014   Procedure: HYSTERECTOMY ABDOMINAL WITH BILATERAL SALPINGECTOMY ;  Surgeon: Delice Lesch, MD;  Location: [facility];  Service: Gynecology;  Laterality: Bilateral;  . ABDOMINAL HYSTERECTOMY  2016  . APPENDECTOMY     2002  . CESAREAN SECTION     2003  . LAPAROSCOPIC HYSTERECTOMY N/A 01/06/2014   Procedure: Attempted  TOTAL LAPAROSCOPIC Hysterectomy,;  Surgeon: Delice Lesch, MD;  Location: [facility];  Service: Gynecology;  Laterality: N/A;  . TRANSVERSE COLON RESECTION N/A 01/06/2014   Procedure: OVERSEW OF SEROSA TEAR OF THE TRANSVERSE COLON ;  Surgeon: Alphonsa Overall, MD;  Location: [facility];  Service: General;  Laterality: N/A;  . TUBAL LIGATION     2013    Family History  Problem Relation Age of Onset  . Heart disease Mother 71    heart attack  . Depression Mother   . Hyperlipidemia Mother   . Hypertension Father 110  . Drug abuse Father   . Depression Maternal Aunt   . Anxiety disorder Maternal Aunt   . Anxiety disorder Paternal Grandmother   . Arthritis Paternal Grandmother   . Depression Paternal Grandmother   . Depression Maternal Grandmother   . Depression Maternal Grandfather   . Depression Paternal Grandfather     Social History  Substance Use Topics  . Smoking status: Never Smoker  . Smokeless tobacco: Never Used  . Alcohol use No    No current facility-administered medications for this encounter.   Current Outpatient Prescriptions:  .  buPROPion (WELLBUTRIN SR) 150 MG 12 hr tablet, Take 150 mg by mouth 2 (two) times daily., Disp: , Rfl:  .  busPIRone (BUSPAR) 15 MG tablet, Take 1 tablet by mouth 2 (two) times daily., Disp: , Rfl:  .  clonazePAM (KLONOPIN) 1 MG tablet, Take 1 mg by mouth daily as needed for anxiety., Disp: , Rfl:  .  Multiple Vitamin (MULTIVITAMIN) capsule, Take 1 capsule by mouth daily., Disp: , Rfl:  .  traZODone (DESYREL) 50 MG tablet, Take 1 tablet by mouth at bedtime., Disp: , Rfl:  .  chlorpheniramine-HYDROcodone (TUSSIONEX PENNKINETIC ER) 10-8 MG/5ML SUER, Take 5 mLs by mouth every 12 (  twelve) hours as needed for cough., Disp: 120 mL, Rfl: 0 .  clobetasol ointment (TEMOVATE) AB-123456789 %, Apply 1 application topically daily. , Disp: , Rfl:  .  DULoxetine (CYMBALTA) 30 MG capsule, Take 1 capsule (30 mg total) by mouth 2 (two) times daily. (Patient not taking: Reported on 12/23/2015), Disp: 60 capsule, Rfl: 0 .  mometasone (NASONEX) 50 MCG/ACT nasal spray, Place 2 sprays into the nose daily., Disp: 17 g, Rfl: 0 .  oseltamivir (TAMIFLU) 75 MG capsule, Take 1 capsule (75 mg total) by mouth 2 (two) times daily.  X 5 days, Disp: 10 capsule, Rfl: 0  Allergies  Allergen Reactions  . Mango Flavor Anaphylaxis and Swelling  . Amoxicillin Hives and Swelling  . Penicillins Itching and Swelling     ROS  As noted in HPI.   Physical Exam  BP 126/77 (BP Location: Right Arm)   Pulse 92   Temp 98.5 F (36.9 C) (Oral)   LMP 10/24/2013   SpO2 97%   Constitutional: Well developed, well nourished, no acute distress Eyes:  EOMI, conjunctiva normal bilaterally HENT: Normocephalic, atraumatic,mucus membranes mois. tPositive clear rhinorrhea with erythematous, swollen turbinates. Positive maxillary sinus tenderness bilaterally. Mild frontal sinus tenderness. Normal oropharynx. Positive postnasal drip. Neck: No cervical lymphadenopathy, meningismus. Positive bilateral trapezial tenderness. Respiratory: Normal inspiratory effort, lungs clear bilaterally Cardiovascular: Normal rate regular rhythm no murmurs rubs or gallops GI: nondistended Back: No CVAT skin: No rash, skin intact Musculoskeletal: no deformities Neurologic: Alert & oriented x 3, no focal neuro deficits Psychiatric: Speech and behavior appropriate   ED Course   Medications - No data to display  No orders of the defined types were placed in this encounter.   No results found for this or any previous visit (from the past 24 hour(s)). No results found.  ED Clinical Impression  Influenza-like illness  Acute maxillary sinusitis, recurrence not specified   ED Assessment/Plan  Presentation consistent with viral URI/influenza-like illness. Also has some sinus tenderness, but she does not have any indications for antibiotics at this time. Afebrile here, but pt Took  ibuprofen within 8 hours prior to evaluation. Plan to send home with ibuprofen 800 mg 1 g of Tylenol 3 times a day, patient to start Mucinex D, saline nasal irrigation. Will give prescription of Nasonex, Tussionex, Tamiflu. Work note for 2 days. Follow-up with PMD as needed.  To the ER if gets worse.  Discussed  MDM, plan and followup with patient. Discussed sn/sx that should prompt return to the ED. Patient agrees with plan.   Meds ordered this encounter  Medications  . mometasone (NASONEX) 50 MCG/ACT nasal spray    Sig: Place 2 sprays into the nose daily.    Dispense:  17 g    Refill:  0  . oseltamivir (TAMIFLU) 75 MG capsule    Sig: Take 1 capsule (75 mg total) by mouth 2 (two) times daily. X 5 days    Dispense:  10 capsule    Refill:  0  . chlorpheniramine-HYDROcodone (TUSSIONEX PENNKINETIC ER) 10-8 MG/5ML SUER    Sig: Take 5 mLs by mouth every 12 (twelve) hours as needed for cough.    Dispense:  120 mL    Refill:  0    *This clinic note was created using [employer]. Therefore, there may be occasional mistakes despite careful proofreading.  ?   Melynda Ripple, MD 01/04/16 1810

## 2016-01-06 ENCOUNTER — Encounter: Payer: Self-pay | Admitting: Nurse Practitioner

## 2016-01-06 ENCOUNTER — Ambulatory Visit (INDEPENDENT_AMBULATORY_CARE_PROVIDER_SITE_OTHER)
Admission: RE | Admit: 2016-01-06 | Discharge: 2016-01-06 | Disposition: A | Discharge status: Home / Self Care | Payer: Federal, State, Local not specified - PPO | Source: Ambulatory Visit | Attending: Nurse Practitioner | Admitting: Nurse Practitioner

## 2016-01-06 ENCOUNTER — Ambulatory Visit (INDEPENDENT_AMBULATORY_CARE_PROVIDER_SITE_OTHER): Payer: Federal, State, Local not specified - PPO | Admitting: Nurse Practitioner

## 2016-01-06 VITALS — BP 108/78 | HR 82 | Temp 98.7°F | Ht 67.0 in | Wt 253.0 lb

## 2016-01-06 DIAGNOSIS — M7989 Other specified soft tissue disorders: Secondary | ICD-10-CM

## 2016-01-06 DIAGNOSIS — F331 Major depressive disorder, recurrent, moderate: Secondary | ICD-10-CM

## 2016-01-06 DIAGNOSIS — M25561 Pain in right knee: Secondary | ICD-10-CM

## 2016-01-06 DIAGNOSIS — F410 Panic disorder [episodic paroxysmal anxiety] without agoraphobia: Secondary | ICD-10-CM

## 2016-01-06 DIAGNOSIS — F411 Generalized anxiety disorder: Secondary | ICD-10-CM | POA: Diagnosis not present

## 2016-01-06 DIAGNOSIS — M179 Osteoarthritis of knee, unspecified: Secondary | ICD-10-CM | POA: Diagnosis not present

## 2016-01-06 DIAGNOSIS — M79661 Pain in right lower leg: Secondary | ICD-10-CM

## 2016-01-06 MED ORDER — MELOXICAM 7.5 MG PO TABS: 7.5000 mg | ORAL_TABLET | Freq: Every day | ORAL | 0 refills | Status: DC

## 2016-01-06 NOTE — Assessment & Plan Note (Addendum)
Stopped wellbutrin (increased anxiety and stuttering), cymbalta (increased somnolence), zoloft and exapro (decreased sex drive), buspar (ineffective) and increased somnolence due to side effects. Using klonopin prn at this time (about once or twice a month).  If anxiety worsens, she is willing to retry wellbutrin at lower dose in future

## 2016-01-06 NOTE — Progress Notes (Signed)
Pre visit review using our clinic review tool, if applicable. No additional management support is needed unless otherwise documented below in the visit note. 

## 2016-01-06 NOTE — Progress Notes (Signed)
Subjective:  Patient ID: Tiffany [name], female    DOB: Oct 11, 1975  Age: 40 y.o. MRN: [id number]  CC: Follow-up (2 wk follow up/stating no pain anymore/)   Knee Pain   The incident occurred more than 1 week ago. There was no injury mechanism. The quality of the pain is described as aching. The pain has been fluctuating since onset. Pertinent negatives include no inability to bear weight, loss of sensation or muscle weakness. The symptoms are aggravated by movement and weight bearing. She has tried NSAIDs (ibuprofen and meloxicam, knee sleeve.) for the symptoms. The treatment provided moderate relief.  Anxiety  Presents for follow-up visit. Symptoms include chest pain, depressed mood, hyperventilation, irritability, muscle tension, nervous/anxious behavior, palpitations, panic and restlessness. Patient reports no suicidal ideas. Symptoms occur occasionally. The severity of symptoms is causing significant distress.      Outpatient Medications Prior to Visit  Medication Sig Dispense Refill  . chlorpheniramine-HYDROcodone (TUSSIONEX PENNKINETIC ER) 10-8 MG/5ML SUER Take 5 mLs by mouth every 12 (twelve) hours as needed for cough. 120 mL 0  . clobetasol ointment (TEMOVATE) AB-123456789 % Apply 1 application topically daily.     . mometasone (NASONEX) 50 MCG/ACT nasal spray Place 2 sprays into the nose daily. 17 g 0  . Multiple Vitamin (MULTIVITAMIN) capsule Take 1 capsule by mouth daily.    Marland Kitchen oseltamivir (TAMIFLU) 75 MG capsule Take 1 capsule (75 mg total) by mouth 2 (two) times daily. X 5 days 10 capsule 0  . traZODone (DESYREL) 50 MG tablet Take 1 tablet by mouth at bedtime.    Marland Kitchen buPROPion (WELLBUTRIN SR) 150 MG 12 hr tablet Take 150 mg by mouth 2 (two) times daily.    . busPIRone (BUSPAR) 15 MG tablet Take 1 tablet by mouth 2 (two) times daily.    . clonazePAM (KLONOPIN) 1 MG tablet Take 1 mg by mouth daily as needed for anxiety.    . DULoxetine (CYMBALTA) 30 MG capsule Take 1 capsule (30 mg total) by  mouth 2 (two) times daily. (Patient not taking: Reported on 01/06/2016) 60 capsule 0   No facility-administered medications prior to visit.     ROS See HPI  Objective:  BP 108/78   Pulse 82   Temp 98.7 F (37.1 C)   Ht 5\' 7"  (1.702 m)   Wt 253 lb (114.8 kg)   LMP 10/24/2013   SpO2 99%   BMI 39.63 kg/m   BP Readings from Last 3 Encounters:  01/06/16 108/78  01/04/16 126/77  12/23/15 110/70    Wt Readings from Last 3 Encounters:  01/06/16 253 lb (114.8 kg)  12/23/15 245 lb (111.1 kg)  09/05/14 237 lb (107.5 kg)    Physical Exam  Constitutional: She is oriented to person, place, and time. No distress.  Cardiovascular: Normal rate and normal heart sounds.   Pulmonary/Chest: Effort normal and breath sounds normal.  Musculoskeletal: She exhibits tenderness. She exhibits no edema.       Right hip: Normal.       Right knee: She exhibits effusion. She exhibits normal range of motion and no erythema. Tenderness found. No medial joint line, no lateral joint line and no patellar tendon tenderness noted.       Right ankle: Normal.  Neurological: She is alert and oriented to person, place, and time.  Skin: Skin is warm and dry.  Vitals reviewed.   Lab Results  Component Value Date   WBC 9.8 12/23/2015   HGB 12.6 12/23/2015   HCT  38.5 12/23/2015   PLT 357.0 12/23/2015   GLUCOSE 95 12/23/2015   CHOL 211 (H) 12/23/2015   TRIG 131.0 12/23/2015   HDL 40.40 12/23/2015   LDLCALC 145 (H) 12/23/2015   ALT 19 12/23/2015   AST 17 12/23/2015   NA 139 12/23/2015   K 4.3 12/23/2015   CL 102 12/23/2015   CREATININE 0.89 12/23/2015   BUN 12 12/23/2015   CO2 31 12/23/2015   TSH 1.76 12/23/2015    No results found.  Assessment & Plan:   [name] was seen today for follow-up.  Diagnoses and all orders for this visit:  Acute pain of right knee -     DG Knee Complete 4 Views Right; Future -     meloxicam (MOBIC) 7.5 MG tablet; Take 1 tablet (7.5 mg total) by mouth  daily.  Pain and swelling of right lower leg -     DG Knee Complete 4 Views Right; Future -     meloxicam (MOBIC) 7.5 MG tablet; Take 1 tablet (7.5 mg total) by mouth daily.  Generalized anxiety disorder  Depression, major, recurrent, moderate (HCC)  Panic disorder without agoraphobia with severe panic attacks   I have discontinued [name]'s DULoxetine, busPIRone, buPROPion, clonazePAM, and ibuprofen. I am also having her start on meloxicam. Additionally, I am having her maintain her multivitamin, clobetasol ointment, traZODone, mometasone, oseltamivir, and chlorpheniramine-HYDROcodone.  Meds ordered this encounter  Medications  . DISCONTD: ibuprofen (ADVIL,MOTRIN) 800 MG tablet    Sig: Take 800 mg by mouth every 8 (eight) hours as needed.  . meloxicam (MOBIC) 7.5 MG tablet    Sig: Take 1 tablet (7.5 mg total) by mouth daily.    Dispense:  30 tablet    Refill:  0    Order Specific Question:   Supervising Provider    Answer:   Cassandria Anger [[id number]]    Follow-up: No Follow-up on file.  Tiffany Lacy, NP

## 2016-01-07 ENCOUNTER — Telehealth: Payer: Self-pay | Admitting: Nurse Practitioner

## 2016-01-07 NOTE — Telephone Encounter (Signed)
Gave x ray results

## 2016-01-12 DIAGNOSIS — M2550 Pain in unspecified joint: Secondary | ICD-10-CM | POA: Diagnosis not present

## 2016-01-12 DIAGNOSIS — F419 Anxiety disorder, unspecified: Secondary | ICD-10-CM | POA: Diagnosis not present

## 2016-01-12 DIAGNOSIS — Z6841 Body Mass Index (BMI) 40.0 and over, adult: Secondary | ICD-10-CM | POA: Diagnosis not present

## 2016-01-12 DIAGNOSIS — Z713 Dietary counseling and surveillance: Secondary | ICD-10-CM | POA: Diagnosis not present

## 2016-01-26 DIAGNOSIS — F419 Anxiety disorder, unspecified: Secondary | ICD-10-CM | POA: Diagnosis not present

## 2016-01-26 DIAGNOSIS — R5383 Other fatigue: Secondary | ICD-10-CM | POA: Diagnosis not present

## 2016-01-26 DIAGNOSIS — Z6841 Body Mass Index (BMI) 40.0 and over, adult: Secondary | ICD-10-CM | POA: Diagnosis not present

## 2016-01-26 DIAGNOSIS — M2550 Pain in unspecified joint: Secondary | ICD-10-CM | POA: Diagnosis not present

## 2016-01-26 DIAGNOSIS — Z713 Dietary counseling and surveillance: Secondary | ICD-10-CM | POA: Diagnosis not present

## 2016-01-27 DIAGNOSIS — L700 Acne vulgaris: Secondary | ICD-10-CM | POA: Diagnosis not present

## 2016-02-01 DIAGNOSIS — L668 Other cicatricial alopecia: Secondary | ICD-10-CM | POA: Diagnosis not present

## 2016-02-02 DIAGNOSIS — F419 Anxiety disorder, unspecified: Secondary | ICD-10-CM | POA: Diagnosis not present

## 2016-02-02 DIAGNOSIS — Z6839 Body mass index (BMI) 39.0-39.9, adult: Secondary | ICD-10-CM | POA: Diagnosis not present

## 2016-02-02 DIAGNOSIS — Z713 Dietary counseling and surveillance: Secondary | ICD-10-CM | POA: Diagnosis not present

## 2016-02-15 DIAGNOSIS — Z6838 Body mass index (BMI) 38.0-38.9, adult: Secondary | ICD-10-CM | POA: Diagnosis not present

## 2016-02-15 DIAGNOSIS — R5383 Other fatigue: Secondary | ICD-10-CM | POA: Diagnosis not present

## 2016-02-15 DIAGNOSIS — Z713 Dietary counseling and surveillance: Secondary | ICD-10-CM | POA: Diagnosis not present

## 2016-02-27 IMAGING — CR DG CHEST 2V
2 series · 2 of 2 positions shown · non-contrast
Comparison: Radiograph dated 07/07/2014

CLINICAL DATA: 39-year-old female with cough and congestion

EXAM:
CHEST  2 VIEW

[chest pa]
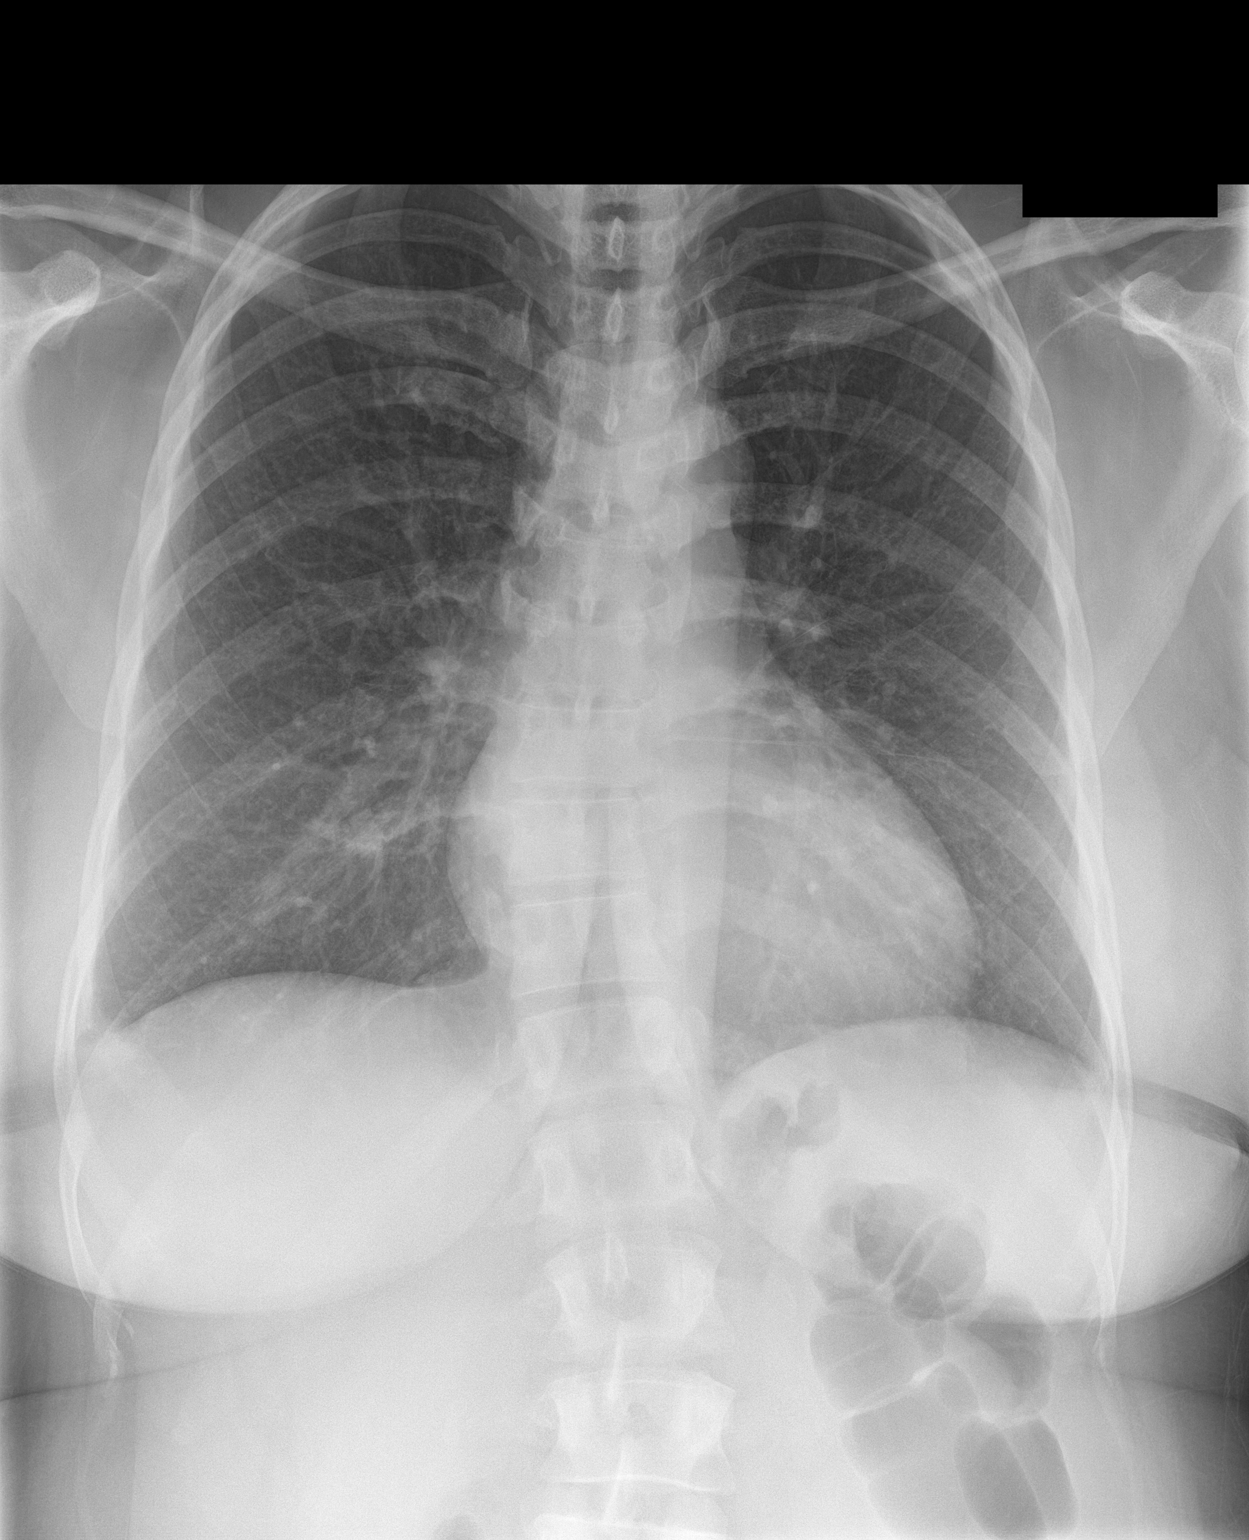

[chest lat]
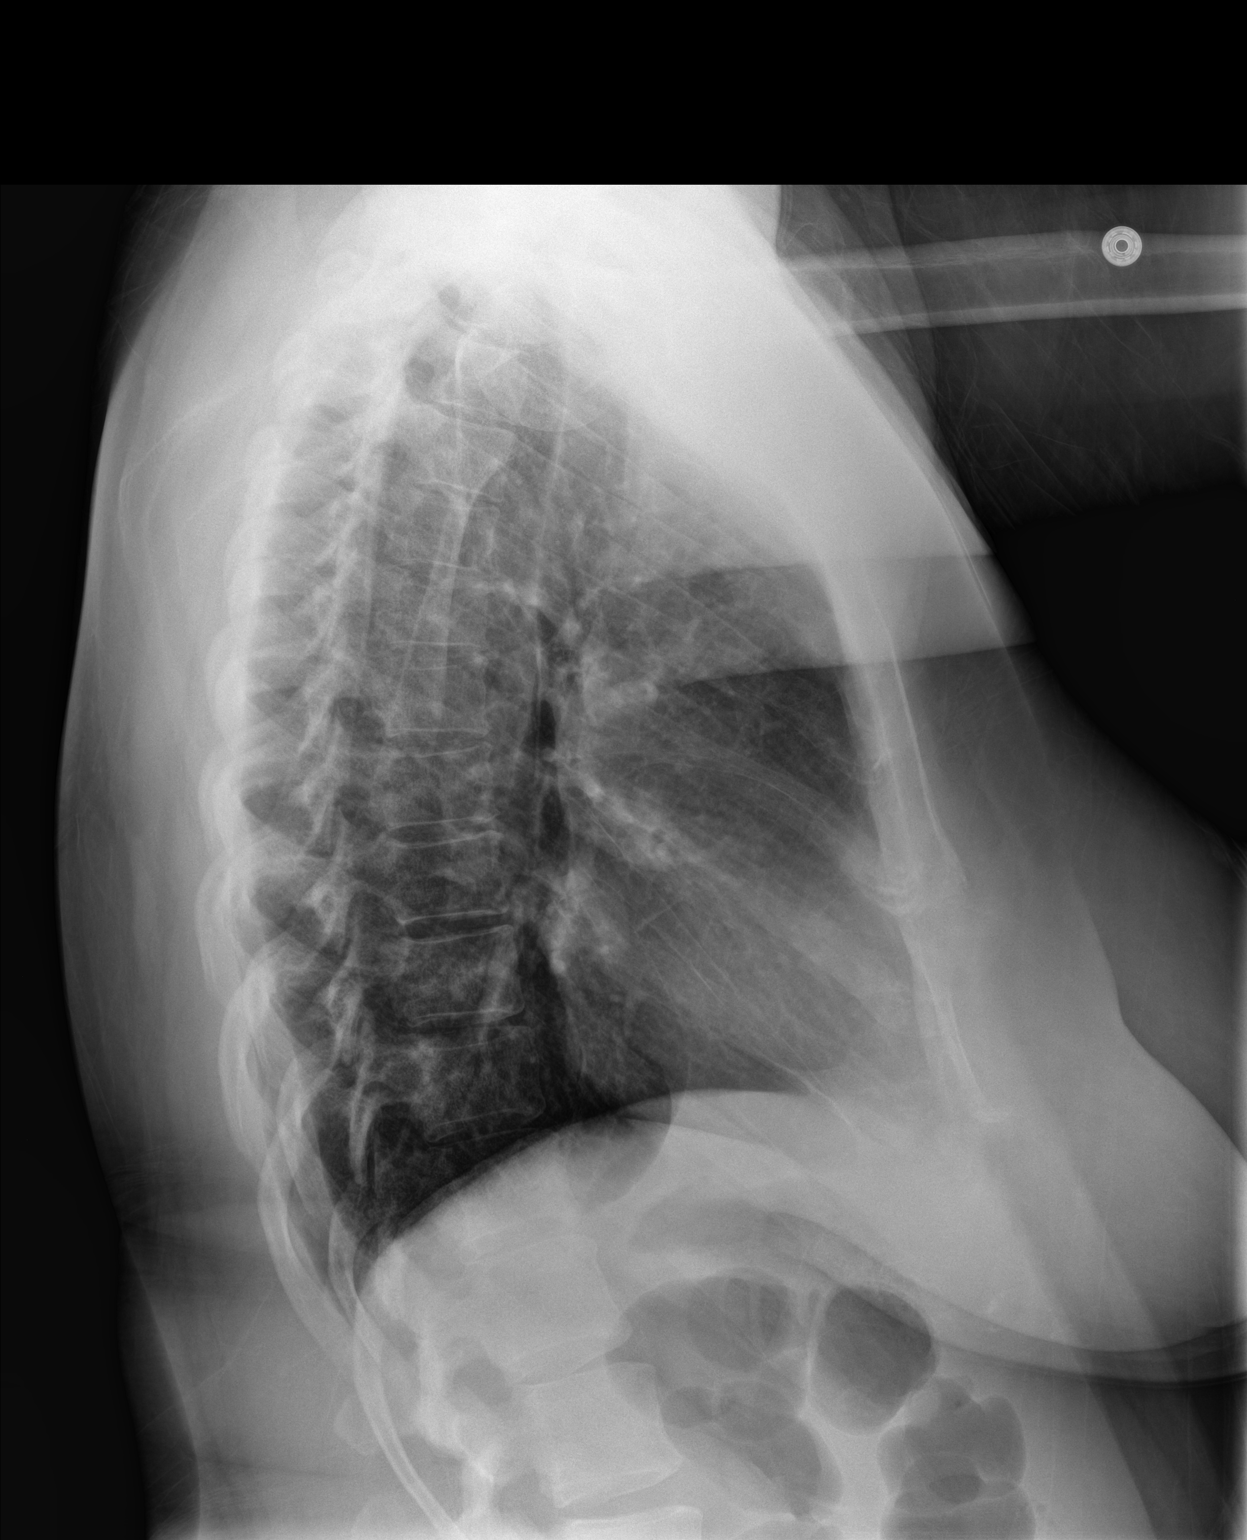

[2 of 2 positions shown; findings below may reference images not displayed]

FINDINGS: The heart size and mediastinal contours are within normal limits.
Both lungs are clear. The visualized skeletal structures are
unremarkable.
IMPRESSION: No active cardiopulmonary disease.

## 2016-03-01 NOTE — Progress Notes (Deleted)
[facility] [address] [place], [place] [address] Phone: [phone] Subjective:    I'm seeing this patient by the request  of:  [name], NP   CC:  Right knee pain  [id number]  Tiffany [name] is a 41 y.o. female coming in with complaint of right knee pain been going on for 5-6 weeks. Does not remember any true injury. Describes the pain as more of an aching sensation. Patient was seen by primary care provider [place] one month ago and given meloxicam.   patient did have x-rays 01/06/2016. These were independently visualized by me. Right knee x-rays show mild medial and patellofemoral arthritis but otherwise fairly unremarkable.  Past Medical History:  Diagnosis Date  . Anemia   . Anxiety   . Chicken pox 1981  . Depression   . GERD (gastroesophageal reflux disease)   . Hay fever 2015  . PTSD (post-traumatic stress disorder)   . Umbilical hernia    Past Surgical History:  Procedure Laterality Date  . ABDOMINAL HYSTERECTOMY Bilateral 01/06/2014   Procedure: HYSTERECTOMY ABDOMINAL WITH BILATERAL SALPINGECTOMY ;  Surgeon: Delice Lesch, MD;  Location: [facility];  Service: Gynecology;  Laterality: Bilateral;  . ABDOMINAL HYSTERECTOMY  2016  . APPENDECTOMY     2002  . CESAREAN SECTION     2003  . LAPAROSCOPIC HYSTERECTOMY N/A 01/06/2014   Procedure: Attempted  TOTAL LAPAROSCOPIC Hysterectomy,;  Surgeon: Delice Lesch, MD;  Location: [facility];  Service: Gynecology;  Laterality: N/A;  . TRANSVERSE COLON RESECTION N/A 01/06/2014   Procedure: OVERSEW OF SEROSA TEAR OF THE TRANSVERSE COLON ;  Surgeon: Alphonsa Overall, MD;  Location: [facility];  Service: General;  Laterality: N/A;  . TUBAL LIGATION     2013   Social History   Social History  . Marital status: Married    Spouse name: N/A  . Number of children: N/A  . Years of education: N/A   Social History Main Topics  . Smoking status: Never Smoker  . Smokeless tobacco: Never Used  .  Alcohol use No  . Drug use: No  . Sexual activity: Yes   Other Topics Concern  . Not on file   Social History Narrative   Work or School: works for [employer]: lives with husband and 4 children (12-20yo), husband is supportive "wonderful"      Spiritual Beliefs: Christian      Lifestyle: walks 3 times per week; working on diet            Allergies  Allergen Reactions  . Mango Flavor Anaphylaxis and Swelling  . Amoxicillin Hives and Swelling  . Penicillins Itching and Swelling   Family History  Problem Relation Age of Onset  . Heart disease Mother 52    heart attack  . Depression Mother   . Hyperlipidemia Mother   . Hypertension Father 44  . Drug abuse Father   . Depression Maternal Aunt   . Anxiety disorder Maternal Aunt   . Anxiety disorder Paternal Grandmother   . Arthritis Paternal Grandmother   . Depression Paternal Grandmother   . Depression Maternal Grandmother   . Depression Maternal Grandfather   . Depression Paternal Grandfather     Past medical history, social, surgical and family history all reviewed in electronic medical record.  No pertanent information unless stated regarding to the chief complaint.   Review of Systems:Review of systems updated  and as accurate as of 03/01/16  No headache, visual changes, nausea, vomiting, diarrhea, constipation, dizziness, abdominal pain, skin rash, fevers, chills, night sweats, weight loss, swollen lymph nodes, body aches, joint swelling, muscle aches, chest pain, shortness of breath, mood changes.   Objective  Last menstrual period 10/24/2013. Systems examined below as of 03/01/16   General: No apparent distress alert and oriented x3 mood and affect normal, dressed appropriately.  HEENT: Pupils equal, extraocular movements intact  Respiratory: Patient's speak in full sentences and does not appear short of breath  Cardiovascular: No lower  extremity edema, non tender, no erythema  Skin: Warm dry intact with no signs of infection or rash on extremities or on axial skeleton.  Abdomen: Soft nontender  Neuro: Cranial nerves II through XII are intact, neurovascularly intact in all extremities with 2+ DTRs and 2+ pulses.  Lymph: No lymphadenopathy of posterior or anterior cervical chain or axillae bilaterally.  Gait normal with good balance and coordination.  MSK:  Non tender with full range of motion and good stability and symmetric strength and tone of shoulders, elbows, wrist, hip, and ankles bilaterally.  Knee: Normal to inspection with no erythema or effusion or obvious bony abnormalities. Palpation normal with no warmth, joint line tenderness, patellar tenderness, or condyle tenderness. ROM full in flexion and extension and lower leg rotation. Ligaments with solid consistent endpoints including ACL, PCL, LCL, MCL. Negative Mcmurray's, Apley's, and Thessalonian tests. Non painful patellar compression. Patellar glide without crepitus. Patellar and quadriceps tendons unremarkable. Hamstring and quadriceps strength is normal.    MSK US performed of: *** This study was ordered, performed, and interpreted by Charlann Boxer D.O.  Knee: All structures visualized. Anteromedial, anterolateral, posteromedial, and posterolateral menisci unremarkable without tearing, fraying, effusion, or displacement. Patellar Tendon unremarkable on long and transverse views without effusion. No abnormality of prepatellar bursa. LCL and MCL unremarkable on long and transverse views. No abnormality of origin of medial or lateral head of the gastrocnemius.  IMPRESSION:  NORMAL ULTRASONOGRAPHIC EXAMINATION OF THE KNEE.    Impression and Recommendations:     This case required medical decision making of moderate complexity.      Note: This dictation was prepared with Dragon dictation along with smaller phrase technology. Any transcriptional  errors that result from this process are unintentional.

## 2016-03-02 ENCOUNTER — Ambulatory Visit: Payer: Self-pay | Admitting: Family Medicine

## 2016-03-08 DIAGNOSIS — K080 Exfoliation of teeth due to systemic causes: Secondary | ICD-10-CM | POA: Diagnosis not present

## 2016-03-09 DIAGNOSIS — Z6838 Body mass index (BMI) 38.0-38.9, adult: Secondary | ICD-10-CM | POA: Diagnosis not present

## 2016-03-09 DIAGNOSIS — Z713 Dietary counseling and surveillance: Secondary | ICD-10-CM | POA: Diagnosis not present

## 2016-03-17 DIAGNOSIS — R5383 Other fatigue: Secondary | ICD-10-CM | POA: Diagnosis not present

## 2016-03-17 DIAGNOSIS — Z6839 Body mass index (BMI) 39.0-39.9, adult: Secondary | ICD-10-CM | POA: Diagnosis not present

## 2016-03-17 DIAGNOSIS — Z713 Dietary counseling and surveillance: Secondary | ICD-10-CM | POA: Diagnosis not present

## 2016-03-24 DIAGNOSIS — Z713 Dietary counseling and surveillance: Secondary | ICD-10-CM | POA: Diagnosis not present

## 2016-03-24 DIAGNOSIS — Z6839 Body mass index (BMI) 39.0-39.9, adult: Secondary | ICD-10-CM | POA: Diagnosis not present

## 2016-03-24 DIAGNOSIS — M2550 Pain in unspecified joint: Secondary | ICD-10-CM | POA: Diagnosis not present

## 2016-03-29 DIAGNOSIS — L668 Other cicatricial alopecia: Secondary | ICD-10-CM | POA: Diagnosis not present

## 2016-03-29 DIAGNOSIS — L700 Acne vulgaris: Secondary | ICD-10-CM | POA: Diagnosis not present

## 2016-03-31 DIAGNOSIS — Z713 Dietary counseling and surveillance: Secondary | ICD-10-CM | POA: Diagnosis not present

## 2016-03-31 DIAGNOSIS — M2550 Pain in unspecified joint: Secondary | ICD-10-CM | POA: Diagnosis not present

## 2016-03-31 DIAGNOSIS — Z6838 Body mass index (BMI) 38.0-38.9, adult: Secondary | ICD-10-CM | POA: Diagnosis not present

## 2016-04-14 DIAGNOSIS — R5383 Other fatigue: Secondary | ICD-10-CM | POA: Diagnosis not present

## 2016-04-14 DIAGNOSIS — Z713 Dietary counseling and surveillance: Secondary | ICD-10-CM | POA: Diagnosis not present

## 2016-04-14 DIAGNOSIS — M2550 Pain in unspecified joint: Secondary | ICD-10-CM | POA: Diagnosis not present

## 2016-04-19 DIAGNOSIS — F411 Generalized anxiety disorder: Secondary | ICD-10-CM | POA: Diagnosis not present

## 2016-04-20 ENCOUNTER — Ambulatory Visit (INDEPENDENT_AMBULATORY_CARE_PROVIDER_SITE_OTHER): Payer: Federal, State, Local not specified - PPO | Admitting: Family Medicine

## 2016-04-20 ENCOUNTER — Encounter: Payer: Self-pay | Admitting: Family Medicine

## 2016-04-20 VITALS — BP 100/80 | HR 66 | Temp 98.8°F | Ht 67.0 in | Wt 240.5 lb

## 2016-04-20 DIAGNOSIS — R300 Dysuria: Secondary | ICD-10-CM

## 2016-04-20 DIAGNOSIS — K5900 Constipation, unspecified: Secondary | ICD-10-CM

## 2016-04-20 DIAGNOSIS — R1084 Generalized abdominal pain: Secondary | ICD-10-CM | POA: Diagnosis not present

## 2016-04-20 DIAGNOSIS — F330 Major depressive disorder, recurrent, mild: Secondary | ICD-10-CM | POA: Diagnosis not present

## 2016-04-20 DIAGNOSIS — F411 Generalized anxiety disorder: Secondary | ICD-10-CM | POA: Diagnosis not present

## 2016-04-20 LAB — POCT URINALYSIS DIPSTICK
Bilirubin, UA: NEGATIVE
Blood, UA: NEGATIVE
Glucose, UA: NEGATIVE
Ketones, UA: NEGATIVE
Leukocytes, UA: NEGATIVE
Nitrite, UA: NEGATIVE
PH UA: 6.5 (ref 5.0–8.0)
PROTEIN UA: NEGATIVE
Spec Grav, UA: 1.02 (ref 1.030–1.035)
Urobilinogen, UA: 0.2 (ref ?–2.0)

## 2016-04-20 NOTE — Patient Instructions (Signed)
Start mirilax once daily for the next 3 days, then twice daily for 3 days if needed to achieve daily soft bowel movements.   Start daily fiber supplement such as metamucil.  We sent the urine for a culture.  I hope you are feeling better soon! Seek care immediately if worsening, new concerns or you are not improving with treatment.

## 2016-04-20 NOTE — Addendum Note (Signed)
Addended by: Agnes Lawrence on: 04/20/2016 05:45 PM   Modules accepted: Orders

## 2016-04-20 NOTE — Progress Notes (Signed)
HPI:  Acute visit for:  Dysuria: -started 2 days ago -having dysuria, lower abd pressure when urinates, mild dull low back pain and some mild lower abd discomfort, constipation without BM in 2 days - last BM hard with straining -has had issues with constipation in the past -wanted to check urine for UTI -no fevers, vomiting, diarrhea, melena, hematochezia, vaginal discharge, hematuria, flank pain, concern for STI - s/p hysterectomy -recently started cymbalta with her psychiatrist 3 days ago- reports lots of anxiety about taking meds and often does not tolerate - wonders if this is causing her symptoms  ROS: See pertinent positives and negatives per HPI.  Past Medical History:  Diagnosis Date  . Anemia   . Anxiety   . Chicken pox 1981  . Depression   . GERD (gastroesophageal reflux disease)   . Hay fever 2015  . PTSD (post-traumatic stress disorder)   . Umbilical hernia     Past Surgical History:  Procedure Laterality Date  . ABDOMINAL HYSTERECTOMY Bilateral 01/06/2014   Procedure: HYSTERECTOMY ABDOMINAL WITH BILATERAL SALPINGECTOMY ;  Surgeon: Delice Lesch, MD;  Location: [facility];  Service: Gynecology;  Laterality: Bilateral;  . ABDOMINAL HYSTERECTOMY  2016  . APPENDECTOMY     2002  . CESAREAN SECTION     2003  . LAPAROSCOPIC HYSTERECTOMY N/A 01/06/2014   Procedure: Attempted  TOTAL LAPAROSCOPIC Hysterectomy,;  Surgeon: Delice Lesch, MD;  Location: [facility];  Service: Gynecology;  Laterality: N/A;  . TRANSVERSE COLON RESECTION N/A 01/06/2014   Procedure: OVERSEW OF SEROSA TEAR OF THE TRANSVERSE COLON ;  Surgeon: Alphonsa Overall, MD;  Location: [facility];  Service: General;  Laterality: N/A;  . TUBAL LIGATION     2013    Family History  Problem Relation Age of Onset  . Heart disease Mother 77    heart attack  . Depression Mother   . Hyperlipidemia Mother   . Hypertension Father 87  . Drug abuse Father   . Depression Maternal Aunt   . Anxiety disorder Maternal Aunt    . Anxiety disorder Paternal Grandmother   . Arthritis Paternal Grandmother   . Depression Paternal Grandmother   . Depression Maternal Grandmother   . Depression Maternal Grandfather   . Depression Paternal Grandfather     Social History   Social History  . Marital status: Married    Spouse name: N/A  . Number of children: N/A  . Years of education: N/A   Social History Main Topics  . Smoking status: Never Smoker  . Smokeless tobacco: Never Used  . Alcohol use No  . Drug use: No  . Sexual activity: Yes   Other Topics Concern  . None   Social History Narrative   Work or School: works for [employer]: lives with husband and 4 children (12-20yo), husband is supportive "wonderful"      Spiritual Beliefs: Christian      Lifestyle: walks 3 times per week; working on diet              Current Outpatient Prescriptions:  .  clobetasol ointment (TEMOVATE) 2.95 %, Apply 1 application topically daily. , Disp: , Rfl:  .  meloxicam (MOBIC) 7.5 MG tablet, Take 1 tablet (7.5 mg total) by mouth daily., Disp: 30 tablet, Rfl: 0 .  mometasone (NASONEX) 50 MCG/ACT nasal spray, Place 2 sprays into the nose daily., Disp: 17 g, Rfl: 0 .  Multiple  Vitamin (MULTIVITAMIN) capsule, Take 1 capsule by mouth daily., Disp: , Rfl:  .  traZODone (DESYREL) 50 MG tablet, Take 1 tablet by mouth at bedtime., Disp: , Rfl:   EXAM:  Vitals:   04/20/16 1708  BP: 100/80  Pulse: 66  Temp: 98.8 F (37.1 C)    Body mass index is 37.67 kg/m.  GENERAL: vitals reviewed and listed above, alert, oriented, appears well hydrated and in no acute distress  HEENT: atraumatic, conjunttiva clear, no obvious abnormalities on inspection of external nose and ears  NECK: no obvious masses on inspection  LUNGS: clear to auscultation bilaterally, no wheezes, rales or rhonchi, good air movement  CV: HRRR, no peripheral edema  ABD: BS+,  soft, mild difuse TTP, no rebound or guarding, no CVA TTP  MS: moves all extremities without noticeable abnormality  PSYCH: pleasant and cooperative, no obvious depression or anxiety  ASSESSMENT AND PLAN:  Discussed the following assessment and plan:  Dysuria - Plan: POC Urinalysis Dipstick  Generalized abdominal pain  Constipation, unspecified constipation type  -we discussed possible serious and likely etiologies, workup and treatment, treatment risks and return precautions -after this discussion, Cindie opted for udip, culture pedning, bowel regimen for constipation and discussing cymbalta concerns with her psychiatrist -follow up advised promptly with PCP, here or elsewhere if worsening, new symptoms or is not improving over the next few days.   Patient Instructions  Start mirilax once daily for the next 3 days, then twice daily for 3 days if needed to achieve daily soft bowel movements.   Start daily fiber supplement such as metamucil.  We sent the urine for a culture.  I hope you are feeling better soon! Seek care immediately if worsening, new concerns or you are not improving with treatment.            Colin Benton R., DO

## 2016-04-20 NOTE — Progress Notes (Signed)
Pre visit review using our clinic review tool, if applicable. No additional management support is needed unless otherwise documented below in the visit note. 

## 2016-04-24 DIAGNOSIS — R1084 Generalized abdominal pain: Secondary | ICD-10-CM | POA: Diagnosis not present

## 2016-04-24 DIAGNOSIS — R300 Dysuria: Secondary | ICD-10-CM | POA: Diagnosis not present

## 2016-04-26 ENCOUNTER — Other Ambulatory Visit: Payer: Self-pay | Admitting: Family Medicine

## 2016-04-27 LAB — URINE CULTURE

## 2016-04-27 MED ORDER — SULFAMETHOXAZOLE-TRIMETHOPRIM 800-160 MG PO TABS: 1.0000 | ORAL_TABLET | Freq: Two times a day (BID) | ORAL | 0 refills | Status: DC

## 2016-04-27 NOTE — Addendum Note (Signed)
Addended by: Agnes Lawrence on: 04/27/2016 08:27 AM   Modules accepted: Orders

## 2016-05-07 ENCOUNTER — Emergency Department (HOSPITAL_BASED_OUTPATIENT_CLINIC_OR_DEPARTMENT_OTHER)
Admit: 2016-05-07 | Discharge: 2016-05-07 | Disposition: A | Discharge status: Home / Self Care | Payer: Federal, State, Local not specified - PPO

## 2016-05-07 ENCOUNTER — Emergency Department (HOSPITAL_COMMUNITY)
Admission: EM | Admit: 2016-05-07 | Discharge: 2016-05-07 | Disposition: A | Discharge status: Home / Self Care | Payer: Federal, State, Local not specified - PPO | Attending: Emergency Medicine | Admitting: Emergency Medicine

## 2016-05-07 ENCOUNTER — Encounter (HOSPITAL_COMMUNITY): Payer: Self-pay | Admitting: Emergency Medicine

## 2016-05-07 DIAGNOSIS — M79609 Pain in unspecified limb: Secondary | ICD-10-CM | POA: Diagnosis not present

## 2016-05-07 DIAGNOSIS — K529 Noninfective gastroenteritis and colitis, unspecified: Secondary | ICD-10-CM | POA: Diagnosis not present

## 2016-05-07 DIAGNOSIS — M79604 Pain in right leg: Secondary | ICD-10-CM

## 2016-05-07 DIAGNOSIS — R112 Nausea with vomiting, unspecified: Secondary | ICD-10-CM | POA: Diagnosis not present

## 2016-05-07 DIAGNOSIS — R197 Diarrhea, unspecified: Secondary | ICD-10-CM

## 2016-05-07 LAB — CBC WITH DIFFERENTIAL/PLATELET
BASOS ABS: 0 10*3/uL (ref 0.0–0.1)
BASOS PCT: 1 %
EOS PCT: 3 %
Eosinophils Absolute: 0.2 10*3/uL (ref 0.0–0.7)
HEMATOCRIT: 37.1 % (ref 36.0–46.0)
Hemoglobin: 11.8 g/dL — ABNORMAL LOW (ref 12.0–15.0)
Lymphocytes Relative: 26 %
Lymphs Abs: 2.2 10*3/uL (ref 0.7–4.0)
MCH: 24.6 pg — ABNORMAL LOW (ref 26.0–34.0)
MCHC: 31.8 g/dL (ref 30.0–36.0)
MCV: 77.3 fL — ABNORMAL LOW (ref 78.0–100.0)
MONO ABS: 0.8 10*3/uL (ref 0.1–1.0)
Monocytes Relative: 9 %
NEUTROS ABS: 5.1 10*3/uL (ref 1.7–7.7)
Neutrophils Relative %: 61 %
PLATELETS: 368 10*3/uL (ref 150–400)
RBC: 4.8 MIL/uL (ref 3.87–5.11)
RDW: 15.4 % (ref 11.5–15.5)
WBC: 8.3 10*3/uL (ref 4.0–10.5)

## 2016-05-07 LAB — COMPREHENSIVE METABOLIC PANEL
ALBUMIN: 3.8 g/dL (ref 3.5–5.0)
ALT: 17 U/L (ref 14–54)
AST: 19 U/L (ref 15–41)
Alkaline Phosphatase: 74 U/L (ref 38–126)
Anion gap: 12 (ref 5–15)
BILIRUBIN TOTAL: 0.4 mg/dL (ref 0.3–1.2)
BUN: 7 mg/dL (ref 6–20)
CHLORIDE: 102 mmol/L (ref 101–111)
CO2: 24 mmol/L (ref 22–32)
Calcium: 8.9 mg/dL (ref 8.9–10.3)
Creatinine, Ser: 0.85 mg/dL (ref 0.44–1.00)
GFR calc Af Amer: >60 mL/min (ref >60)
GFR calc non Af Amer: >60 mL/min (ref >60)
GLUCOSE: 97 mg/dL (ref 65–99)
POTASSIUM: 3.4 mmol/L — AB (ref 3.5–5.1)
Sodium: 138 mmol/L (ref 135–145)
Total Protein: 7.1 g/dL (ref 6.5–8.1)

## 2016-05-07 LAB — LIPASE, BLOOD: Lipase: 18 U/L (ref 11–51)

## 2016-05-07 MED ORDER — ACETAMINOPHEN 500 MG PO TABS
1000.0000 mg | ORAL_TABLET | Freq: Once | ORAL | Status: AC
  Administered 2016-05-07: 1000 mg via ORAL
  Filled 2016-05-07: qty 2

## 2016-05-07 MED ORDER — ONDANSETRON 4 MG PO TBDP
8.0000 mg | ORAL_TABLET | Freq: Once | ORAL | Status: AC
  Administered 2016-05-07: 8 mg via ORAL
  Filled 2016-05-07: qty 2

## 2016-05-07 NOTE — Discharge Instructions (Signed)
It was our pleasure to provide your ER care today - we hope that you feel better.  Rest. Drink plenty of fluids.  Follow up with primary care doctor in the next couple days for recheck if symptoms fail to improve/resolve.  Return to ER if worse, new symptoms, fevers, persistent vomiting, severe abdominal pain, other concern.

## 2016-05-07 NOTE — ED Notes (Signed)
Transported to Vascular. °

## 2016-05-07 NOTE — ED Triage Notes (Signed)
Pt st's she has been taking Cymbalta but stopped taking it on her on 2 days agol.  St's this am she started having abd pain with nausea, vomiting and diarrhea.  Pt also c/o a knot behind right knee.  Just noticed this am

## 2016-05-07 NOTE — ED Notes (Signed)
Vascular tech is aware of patient 

## 2016-05-07 NOTE — Progress Notes (Signed)
VASCULAR LAB PRELIMINARY  PRELIMINARY  PRELIMINARY  PRELIMINARY  Right lower extremity venous duplex completed.    Preliminary report:  There is no DVT, SVT, or Baker's cyst noted in the right lower extremity.   Called report to Dr. Maurie Boettcher, [facility], RVT 05/07/2016, 6:48 PM

## 2016-05-07 NOTE — ED Provider Notes (Signed)
[facility] DEPT Provider Note   CSN: [id number] Arrival date & time: 05/07/16  1726     History   Chief Complaint Chief Complaint  Patient presents with  . Emesis    HPI Tiffany [name] is a 41 y.o. female.  Patient c/o nausea, vomiting and diarrhea onset yesterday. 2-3 episodes of each. Diarrhea loose to watery. Emesis clear, no bloody or bilious emesis. Denies abd pain. Denies dysuria or gu c/o. No vaginal discharge or bleeding. Remote hx hysterectomy. Denies back or flank pain. No chest pain or sob. No known ill contacts, bad food ingestion, or recent abx use. Did recently stop taking cymbalta, denies other medication change. Also c/o pain and swelling right lower leg and posterior to knee. No hx dvt or pe. No trauma or strain to area.    The history is provided by the patient.  Emesis   Associated symptoms include diarrhea. Pertinent negatives include no abdominal pain, no chills, no fever and no headaches.    Past Medical History:  Diagnosis Date  . Anemia   . Anxiety   . Chicken pox 1981  . Depression   . GERD (gastroesophageal reflux disease)   . Hay fever 2015  . PTSD (post-traumatic stress disorder)   . Umbilical hernia     Patient Active Problem List   Diagnosis Date Noted  . Encounter for preventative adult health care exam with abnormal findings 12/23/2015  . Stuttering 12/23/2015  . Allergy to penicillin 10/08/2014  . Panic disorder without agoraphobia with severe panic attacks 08/05/2014  . Depression, major, recurrent, moderate ([place]) 07/02/2014    Class: Chronic  . Generalized anxiety disorder 07/02/2014    Class: Chronic  . Right sided abdominal pain 02/20/2014  . Urinary incontinence in female 02/20/2014  . Post-operative pain 01/28/2014  . Ventral hernia 05/09/2013    Past Surgical History:  Procedure Laterality Date  . ABDOMINAL HYSTERECTOMY Bilateral 01/06/2014   Procedure: HYSTERECTOMY ABDOMINAL WITH BILATERAL SALPINGECTOMY ;  Surgeon:  Delice Lesch, MD;  Location: [facility];  Service: Gynecology;  Laterality: Bilateral;  . ABDOMINAL HYSTERECTOMY  2016  . APPENDECTOMY     2002  . CESAREAN SECTION     2003  . LAPAROSCOPIC HYSTERECTOMY N/A 01/06/2014   Procedure: Attempted  TOTAL LAPAROSCOPIC Hysterectomy,;  Surgeon: Delice Lesch, MD;  Location: [facility];  Service: Gynecology;  Laterality: N/A;  . TRANSVERSE COLON RESECTION N/A 01/06/2014   Procedure: OVERSEW OF SEROSA TEAR OF THE TRANSVERSE COLON ;  Surgeon: Alphonsa Overall, MD;  Location: [facility];  Service: General;  Laterality: N/A;  . TUBAL LIGATION     2013    OB History    Gravida Para Term Preterm AB Living   4 3 3   1 7    SAB TAB Ectopic Multiple Live Births   1       3       Home Medications    Prior to Admission medications   Medication Sig Start Date End Date Taking? Authorizing Provider  clobetasol ointment (TEMOVATE) 9.52 % Apply 1 application topically daily.     Historical Provider, MD  meloxicam (MOBIC) 7.5 MG tablet Take 1 tablet (7.5 mg total) by mouth daily. 01/06/16   Charlene Brooke Nche, NP  mometasone (NASONEX) 50 MCG/ACT nasal spray Place 2 sprays into the nose daily. 01/04/16   Melynda Ripple, MD  Multiple Vitamin (MULTIVITAMIN) capsule Take 1 capsule by mouth daily.    Historical Provider, MD  sulfamethoxazole-trimethoprim (BACTRIM DS) 800-160 MG  tablet Take 1 tablet by mouth 2 (two) times daily. 04/27/16   Lucretia Kern, DO  traZODone (DESYREL) 50 MG tablet Take 1 tablet by mouth at bedtime. 08/24/14   Historical Provider, MD    Family History Family History  Problem Relation Age of Onset  . Heart disease Mother 9    heart attack  . Depression Mother   . Hyperlipidemia Mother   . Hypertension Father 25  . Drug abuse Father   . Depression Maternal Aunt   . Anxiety disorder Maternal Aunt   . Anxiety disorder Paternal Grandmother   . Arthritis Paternal Grandmother   . Depression Paternal Grandmother   . Depression Maternal  Grandmother   . Depression Maternal Grandfather   . Depression Paternal Grandfather     Social History Social History  Substance Use Topics  . Smoking status: Never Smoker  . Smokeless tobacco: Never Used  . Alcohol use No     Allergies   Mango flavor; Amoxicillin; and Penicillins   Review of Systems Review of Systems  Constitutional: Negative for chills and fever.  HENT: Negative for sore throat.   Eyes: Negative for redness.  Respiratory: Negative for shortness of breath.   Cardiovascular: Negative for chest pain.  Gastrointestinal: Positive for diarrhea and vomiting. Negative for abdominal pain.  Genitourinary: Negative for flank pain.  Musculoskeletal: Negative for back pain and neck pain.  Skin: Negative for rash.  Neurological: Negative for headaches.  Hematological: Does not bruise/bleed easily.  Psychiatric/Behavioral: Negative for confusion.     Physical Exam Updated Vital Signs BP 134/87 (BP Location: Left Arm)   Pulse 71   Temp 98.1 F (36.7 C) (Oral)   Resp 18   Ht 5\' 7"  (1.702 m)   Wt 104.3 kg   LMP 10/24/2013   SpO2 100%   BMI 36.02 kg/m   Physical Exam  Constitutional: She appears well-developed and well-nourished. No distress.  HENT:  Mouth/Throat: Oropharynx is clear and moist.  Eyes: Conjunctivae are normal. No scleral icterus.  Neck: Neck supple. No tracheal deviation present.  Cardiovascular: Normal rate, regular rhythm, normal heart sounds and intact distal pulses.  Exam reveals no friction rub.   No murmur heard. Pulmonary/Chest: Effort normal and breath sounds normal. No respiratory distress.  Abdominal: Soft. Normal appearance and bowel sounds are normal. She exhibits no distension and no mass. There is no tenderness. There is no rebound and no guarding. No hernia.  Genitourinary:  Genitourinary Comments: No cva tenderness  Musculoskeletal: She exhibits no edema.  No edema to legs noted. Distal pulses palp. Good rom at right hip,  knee and ankle without pain.   Neurological: She is alert.  Skin: Skin is warm and dry. No rash noted.  Psychiatric: She has a normal mood and affect.  Nursing note and vitals reviewed.    ED Treatments / Results  Labs (all labs ordered are listed, but only abnormal results are displayed)   Results for orders placed or performed during the hospital encounter of 05/07/16  CBC with Differential  Result Value Ref Range   WBC 8.3 4.0 - 10.5 K/uL   RBC 4.80 3.87 - 5.11 MIL/uL   Hemoglobin 11.8 (L) 12.0 - 15.0 g/dL   HCT 37.1 36.0 - 46.0 %   MCV 77.3 (L) 78.0 - 100.0 fL   MCH 24.6 (L) 26.0 - 34.0 pg   MCHC 31.8 30.0 - 36.0 g/dL   RDW 15.4 11.5 - 15.5 %   Platelets 368 150 - 400  K/uL   Neutrophils Relative % 61 %   Neutro Abs 5.1 1.7 - 7.7 K/uL   Lymphocytes Relative 26 %   Lymphs Abs 2.2 0.7 - 4.0 K/uL   Monocytes Relative 9 %   Monocytes Absolute 0.8 0.1 - 1.0 K/uL   Eosinophils Relative 3 %   Eosinophils Absolute 0.2 0.0 - 0.7 K/uL   Basophils Relative 1 %   Basophils Absolute 0.0 0.0 - 0.1 K/uL  Comprehensive metabolic panel  Result Value Ref Range   Sodium 138 135 - 145 mmol/L   Potassium 3.4 (L) 3.5 - 5.1 mmol/L   Chloride 102 101 - 111 mmol/L   CO2 24 22 - 32 mmol/L   Glucose, Bld 97 65 - 99 mg/dL   BUN 7 6 - 20 mg/dL   Creatinine, Ser 0.85 0.44 - 1.00 mg/dL   Calcium 8.9 8.9 - 10.3 mg/dL   Total Protein 7.1 6.5 - 8.1 g/dL   Albumin 3.8 3.5 - 5.0 g/dL   AST 19 15 - 41 U/L   ALT 17 14 - 54 U/L   Alkaline Phosphatase 74 38 - 126 U/L   Total Bilirubin 0.4 0.3 - 1.2 mg/dL   GFR calc non Af Amer >60 >60 mL/min   GFR calc Af Amer >60 >60 mL/min   Anion gap 12 5 - 15  Lipase, blood  Result Value Ref Range   Lipase 18 11 - 51 U/L    EKG  EKG Interpretation None       Radiology No results found.  Procedures Procedures (including critical care time)  Medications Ordered in ED Medications  acetaminophen (TYLENOL) tablet 1,000 mg (not administered)    ondansetron (ZOFRAN-ODT) disintegrating tablet 8 mg (not administered)     Initial Impression / Assessment and Plan / ED Course  I have reviewed the triage vital signs and the nursing notes.  Pertinent labs & imaging results that were available during my care of the patient were reviewed by me and considered in my medical decision making (see chart for details).  Labs sent.   Vascular dopplers ordered.   zofran po. Po fluids.  Tylenol po.  Reviewed nursing notes and prior charts for additional history.   Recheck, tolerating po fluids.  abd soft nt. No further nvd. Vascular doppler neg.  Pt appears stable for d/c.    Final Clinical Impressions(s) / ED Diagnoses   Final diagnoses:  None    New Prescriptions New Prescriptions   No medications on file     Lajean Saver, MD 05/07/16 1925

## 2016-05-09 ENCOUNTER — Other Ambulatory Visit: Payer: Federal, State, Local not specified - PPO

## 2016-05-09 ENCOUNTER — Other Ambulatory Visit (INDEPENDENT_AMBULATORY_CARE_PROVIDER_SITE_OTHER): Payer: Federal, State, Local not specified - PPO

## 2016-05-09 ENCOUNTER — Ambulatory Visit (INDEPENDENT_AMBULATORY_CARE_PROVIDER_SITE_OTHER): Payer: Federal, State, Local not specified - PPO | Admitting: Nurse Practitioner

## 2016-05-09 ENCOUNTER — Encounter: Payer: Self-pay | Admitting: Nurse Practitioner

## 2016-05-09 VITALS — BP 110/78 | HR 69 | Temp 98.5°F | Ht 67.0 in | Wt 239.0 lb

## 2016-05-09 DIAGNOSIS — K529 Noninfective gastroenteritis and colitis, unspecified: Secondary | ICD-10-CM

## 2016-05-09 DIAGNOSIS — R300 Dysuria: Secondary | ICD-10-CM

## 2016-05-09 DIAGNOSIS — M7989 Other specified soft tissue disorders: Secondary | ICD-10-CM

## 2016-05-09 DIAGNOSIS — R197 Diarrhea, unspecified: Secondary | ICD-10-CM | POA: Diagnosis not present

## 2016-05-09 DIAGNOSIS — M79661 Pain in right lower leg: Secondary | ICD-10-CM | POA: Diagnosis not present

## 2016-05-09 DIAGNOSIS — M25561 Pain in right knee: Secondary | ICD-10-CM

## 2016-05-09 LAB — URINALYSIS, ROUTINE W REFLEX MICROSCOPIC
BILIRUBIN URINE: NEGATIVE
HGB URINE DIPSTICK: NEGATIVE
KETONES UR: NEGATIVE
LEUKOCYTES UA: NEGATIVE
NITRITE: NEGATIVE
Specific Gravity, Urine: 1.02 (ref 1.000–1.030)
TOTAL PROTEIN, URINE-UPE24: NEGATIVE
URINE GLUCOSE: NEGATIVE
UROBILINOGEN UA: 0.2 (ref 0.0–1.0)
WBC, UA: NONE SEEN (ref 0–?)
pH: 5.5 (ref 5.0–8.0)

## 2016-05-09 MED ORDER — BISMUTH SUBSALICYLATE 262 MG/15ML PO SUSP: 30.0000 mL | Freq: Four times a day (QID) | ORAL | 0 refills | Status: DC | PRN

## 2016-05-09 MED ORDER — ONDANSETRON HCL 4 MG PO TABS: 4.0000 mg | ORAL_TABLET | Freq: Three times a day (TID) | ORAL | 0 refills | Status: DC | PRN

## 2016-05-09 NOTE — Patient Instructions (Addendum)
You have appt with Dr. Paulla Fore on Thursday 05/11/16 at 11am.  Go to lab to collect sample container for stool and urine.   Diarrhea, Adult Diarrhea is frequent loose and watery bowel movements. Diarrhea can make you feel weak and cause you to become dehydrated. Dehydration can make you tired and thirsty, cause you to have a dry mouth, and decrease how often you urinate. Diarrhea typically lasts 2-3 days. However, it can last longer if it is a sign of something more serious. It is important to treat your diarrhea as told by your health care provider. Follow these instructions at home: Eating and drinking   Follow these recommendations as told by your health care provider:  Take an oral rehydration solution (ORS). This is a drink that is sold at pharmacies and retail stores.  Drink clear fluids, such as water, ice chips, diluted fruit juice, and low-calorie sports drinks.  Eat bland, easy-to-digest foods in small amounts as you are able. These foods include bananas, applesauce, rice, lean meats, toast, and crackers.  Avoid drinking fluids that contain a lot of sugar or caffeine, such as energy drinks, sports drinks, and soda.  Avoid alcohol.  Avoid spicy or fatty foods. General instructions   Drink enough fluid to keep your urine clear or pale yellow.  Wash your hands often. If soap and water are not available, use hand sanitizer.  Make sure that all people in your household wash their hands well and often.  Take over-the-counter and prescription medicines only as told by your health care provider.  Rest at home while you recover.  Watch your condition for any changes.  Take a warm bath to relieve any burning or pain from frequent diarrhea episodes.  Keep all follow-up visits as told by your health care provider. This is important. Contact a health care provider if:  You have a fever.  Your diarrhea gets worse.  You have new symptoms.  You cannot keep fluids down.  You  feel light-headed or dizzy.  You have a headache  You have muscle cramps. Get help right away if:  You have chest pain.  You feel extremely weak or you faint.  You have bloody or black stools or stools that look like tar.  You have severe pain, cramping, or bloating in your abdomen.  You have trouble breathing or you are breathing very quickly.  Your heart is beating very quickly.  Your skin feels cold and clammy.  You feel confused.  You have signs of dehydration, such as:  Dark urine, very little urine, or no urine.  Cracked lips.  Dry mouth.  Sunken eyes.  Sleepiness.  Weakness. This information is not intended to replace advice given to you by your health care provider. Make sure you discuss any questions you have with your health care provider. Document Released: 12/30/2001 Document Revised: 05/20/2015 Document Reviewed: 09/15/2014 Elsevier Interactive Patient Education  2017 [employer].

## 2016-05-09 NOTE — Progress Notes (Signed)
Subjective:  Patient ID: Tiffany [name], female    DOB: 18-Sep-1975  Age: 41 y.o. MRN: [id number]  CC: Hospitalization Follow-up (ER follow up from stomach problem and right legs swelling on behind knee. [facility])   Diarrhea   This is a new problem. The current episode started in the past 7 days. The problem occurs less than 2 times per day. The problem has been gradually improving. The stool consistency is described as watery. The patient states that diarrhea does not awaken her from sleep. Associated symptoms include abdominal pain, bloating, increased flatus and vomiting. Pertinent negatives include no chills, coughing, fever, headaches, myalgias, sweats, URI or weight loss. Risk factors include recent antibiotic use. She has tried increased fluids and change of diet for the symptoms. The treatment provided mild relief. There is no history of bowel resection, inflammatory bowel disease, irritable bowel syndrome, malabsorption, a recent abdominal surgery or short gut syndrome.  Dysuria   This is a new problem. The current episode started 1 to 4 weeks ago. The problem occurs every urination. The problem has been unchanged. The quality of the pain is described as burning. There has been no fever. She is sexually active. There is no history of pyelonephritis. Associated symptoms include nausea and vomiting. Pertinent negatives include no chills, discharge, flank pain, frequency, hematuria, hesitancy, possible pregnancy, sweats or urgency. She has tried antibiotics for the symptoms. The treatment provided no relief.    Outpatient Medications Prior to Visit  Medication Sig Dispense Refill  . clobetasol ointment (TEMOVATE) 1.74 % Apply 1 application topically daily.     . mometasone (NASONEX) 50 MCG/ACT nasal spray Place 2 sprays into the nose daily. 17 g 0  . Multiple Vitamin (MULTIVITAMIN) capsule Take 1 capsule by mouth daily.    . traZODone (DESYREL) 50 MG tablet Take 1 tablet by mouth at  bedtime.    . meloxicam (MOBIC) 7.5 MG tablet Take 1 tablet (7.5 mg total) by mouth daily. (Patient not taking: Reported on 05/09/2016) 30 tablet 0  . sulfamethoxazole-trimethoprim (BACTRIM DS) 800-160 MG tablet Take 1 tablet by mouth 2 (two) times daily. (Patient not taking: Reported on 05/09/2016) 10 tablet 0   No facility-administered medications prior to visit.     ROS See HPI  Objective:  BP 110/78   Pulse 69   Temp 98.5 F (36.9 C)   Ht 5\' 7"  (1.702 m)   Wt 239 lb (108.4 kg)   LMP 10/24/2013   SpO2 99%   BMI 37.43 kg/m   BP Readings from Last 3 Encounters:  05/09/16 110/78  05/07/16 128/78  04/20/16 100/80    Wt Readings from Last 3 Encounters:  05/09/16 239 lb (108.4 kg)  05/07/16 230 lb (104.3 kg)  04/20/16 240 lb 8 oz (109.1 kg)    Physical Exam  Constitutional: She is oriented to person, place, and time.  Cardiovascular: Normal rate.   Pulmonary/Chest: Effort normal.  Abdominal: Soft. Bowel sounds are normal. She exhibits no distension and no mass. There is tenderness. There is no rebound and no guarding.  Neurological: She is alert and oriented to person, place, and time.  Vitals reviewed.   Lab Results  Component Value Date   WBC 8.3 05/07/2016   HGB 11.8 (L) 05/07/2016   HCT 37.1 05/07/2016   PLT 368 05/07/2016   GLUCOSE 97 05/07/2016   CHOL 211 (H) 12/23/2015   TRIG 131.0 12/23/2015   HDL 40.40 12/23/2015   LDLCALC 145 (H) 12/23/2015   ALT  17 05/07/2016   AST 19 05/07/2016   NA 138 05/07/2016   K 3.4 (L) 05/07/2016   CL 102 05/07/2016   CREATININE 0.85 05/07/2016   BUN 7 05/07/2016   CO2 24 05/07/2016   TSH 1.76 12/23/2015    No results found.  Assessment & Plan:   Tiffany [name] was seen today for hospitalization follow-up.  Diagnoses and all orders for this visit:  Gastroenteritis -     C. difficile GDH and Toxin A/B; Future -     Basic metabolic panel; Future -     ondansetron (ZOFRAN) 4 MG tablet; Take 1 tablet (4 mg total) by mouth  every 8 (eight) hours as needed for nausea or vomiting. -     bismuth subsalicylate (PEPTO-BISMOL) 262 MG/15ML suspension; Take 30 mLs by mouth every 6 (six) hours as needed.  Acute pain of right knee  Pain and swelling of right lower leg  Diarrhea, unspecified type -     Basic metabolic panel; Future -     ondansetron (ZOFRAN) 4 MG tablet; Take 1 tablet (4 mg total) by mouth every 8 (eight) hours as needed for nausea or vomiting. -     bismuth subsalicylate (PEPTO-BISMOL) 262 MG/15ML suspension; Take 30 mLs by mouth every 6 (six) hours as needed.  Dysuria -     Urinalysis, Routine w reflex microscopic; Future   I have discontinued Tiffany [name]'s meloxicam and sulfamethoxazole-trimethoprim. I am also having her start on ondansetron and bismuth subsalicylate. Additionally, I am having her maintain her multivitamin, clobetasol ointment, traZODone, mometasone, and DULoxetine.  Meds ordered this encounter  Medications  . DULoxetine (CYMBALTA) 60 MG capsule    Sig: Take 60 mg by mouth daily.  . ondansetron (ZOFRAN) 4 MG tablet    Sig: Take 1 tablet (4 mg total) by mouth every 8 (eight) hours as needed for nausea or vomiting.    Dispense:  20 tablet    Refill:  0    Order Specific Question:   Supervising Provider    Answer:   Cassandria Anger [[id number]]  . bismuth subsalicylate (PEPTO-BISMOL) 262 MG/15ML suspension    Sig: Take 30 mLs by mouth every 6 (six) hours as needed.    Dispense:  360 mL    Refill:  0    Order Specific Question:   Supervising Provider    Answer:   Cassandria Anger [[id number]]    Follow-up: Return if symptoms worsen or fail to improve.  [name], NP

## 2016-05-09 NOTE — Progress Notes (Signed)
Pre visit review using our clinic review tool, if applicable. No additional management support is needed unless otherwise documented below in the visit note. 

## 2016-05-11 ENCOUNTER — Encounter: Payer: Self-pay | Admitting: Sports Medicine

## 2016-05-11 ENCOUNTER — Ambulatory Visit (INDEPENDENT_AMBULATORY_CARE_PROVIDER_SITE_OTHER): Payer: Federal, State, Local not specified - PPO | Admitting: Sports Medicine

## 2016-05-11 ENCOUNTER — Ambulatory Visit: Payer: Self-pay

## 2016-05-11 VITALS — BP 104/76 | HR 86 | Ht 67.0 in | Wt 238.0 lb

## 2016-05-11 DIAGNOSIS — M1711 Unilateral primary osteoarthritis, right knee: Secondary | ICD-10-CM | POA: Insufficient documentation

## 2016-05-11 DIAGNOSIS — M25561 Pain in right knee: Secondary | ICD-10-CM | POA: Diagnosis not present

## 2016-05-11 NOTE — Assessment & Plan Note (Signed)
Moderate patellofemoral and medial compartment degenerative change with what sounds like intermittent effusions.  Injection performed today with steroid. Reevaluation in 8 weeks for consideration of Visco supplementation.  If any lack of improvement could consider further diagnostic workup suspect underlying degenerative changes are the extent of her issues.

## 2016-05-11 NOTE — Patient Instructions (Signed)
Please perform the exercise program that Jeneen Rinks has prepared for you and gone over in detail on a daily basis.  In addition to the handout you were provided you can access your program through: [email or web address]   Your unique program code is: [facility]

## 2016-05-11 NOTE — Progress Notes (Signed)
OFFICE VISIT NOTE Tiffany [name]. Tiffany [name], [place] [facility] [phone]  Tiffany [name] - 41 y.o. female MRN [id number]  Date of birth: 1975-03-24  Visit Date: 05/11/2016  PCP: [name], NP   Referred by: Flossie Buffy, NP  Burlene Arnt, CMA acting as scribe for Dr. Paulla Fore.  SUBJECTIVE:   Chief Complaint  Patient presents with  . pain in right knee   Right knee pain, anterior and posterior Pain has been off and on since 12/2015.  Pt had xray done 12/2015 dx with arthritis (report reads Mild osteoarthritic change centered on the medial and patellofemoral compartments. There is no acute bony abnormality). She recently lost 15 lbs in hopes that it would help with her knee pain. Over the weekend she ended up in the ED d/t the pain from the "buldge" on the posterior aspect of the knee. They ran tests to make sure she didn't have a blood clot and she reports that she did not.   The pain is described as throbbing "like a tooth ache" and is rated as 7/10.  Worsened with bending and extending, stairs Improves with heat Therapies tried include Motrin 800 mg, got some relief from that. She has also tried Meloxicam with some relief.   Other associated symptoms include: at night there is radiating pain down the lateral side of the right leg.  Not into the foot.  Denies any catching, locking or giving way.  Otherwise ROS as it pertains to the Chief Complaint is as below:      Review of Systems  Constitutional: Positive for chills (stomach flu) and fever (recent diagnosis of "stomach flu" - now improved).  Cardiovascular: Positive for leg swelling.  Musculoskeletal: Positive for joint pain.    Otherwise per HPI.  HISTORY & PERTINENT PRIOR DATA:  No specialty comments available. She reports that she has never smoked. She has never used smokeless tobacco. No results for input(s): HGBA1C, LABURIC in the last 8760 hours. Medications &  Allergies reviewed per EMR Patient Active Problem List   Diagnosis Date Noted  . Primary osteoarthritis of right knee 05/11/2016  . Encounter for preventative adult health care exam with abnormal findings 12/23/2015  . Stuttering 12/23/2015  . Allergy to penicillin 10/08/2014  . Panic disorder without agoraphobia with severe panic attacks 08/05/2014  . Depression, major, recurrent, moderate ([place]) 07/02/2014    Class: Chronic  . Generalized anxiety disorder 07/02/2014    Class: Chronic  . Right sided abdominal pain 02/20/2014  . Urinary incontinence in female 02/20/2014  . Post-operative pain 01/28/2014  . Ventral hernia 05/09/2013   Past Medical History:  Diagnosis Date  . Anemia   . Anxiety   . Chicken pox 1981  . Depression   . GERD (gastroesophageal reflux disease)   . Hay fever 2015  . PTSD (post-traumatic stress disorder)   . Umbilical hernia    Family History  Problem Relation Age of Onset  . Heart disease Mother 40    heart attack  . Depression Mother   . Hyperlipidemia Mother   . Hypertension Father 73  . Drug abuse Father   . Depression Maternal Aunt   . Anxiety disorder Maternal Aunt   . Anxiety disorder Paternal Grandmother   . Arthritis Paternal Grandmother   . Depression Paternal Grandmother   . Depression Maternal Grandmother   . Depression Maternal Grandfather   . Depression Paternal Grandfather    Past Surgical History:  Procedure Laterality  Date  . ABDOMINAL HYSTERECTOMY Bilateral 01/06/2014   Procedure: HYSTERECTOMY ABDOMINAL WITH BILATERAL SALPINGECTOMY ;  Surgeon: Delice Lesch, MD;  Location: [facility];  Service: Gynecology;  Laterality: Bilateral;  . ABDOMINAL HYSTERECTOMY  2016  . APPENDECTOMY     2002  . CESAREAN SECTION     2003  . LAPAROSCOPIC HYSTERECTOMY N/A 01/06/2014   Procedure: Attempted  TOTAL LAPAROSCOPIC Hysterectomy,;  Surgeon: Delice Lesch, MD;  Location: [facility];  Service: Gynecology;  Laterality: N/A;  . TRANSVERSE  COLON RESECTION N/A 01/06/2014   Procedure: OVERSEW OF SEROSA TEAR OF THE TRANSVERSE COLON ;  Surgeon: Alphonsa Overall, MD;  Location: [facility];  Service: General;  Laterality: N/A;  . TUBAL LIGATION     2013   Social History   Occupational History  . Not on file.   Social History Main Topics  . Smoking status: Never Smoker  . Smokeless tobacco: Never Used  . Alcohol use No  . Drug use: No  . Sexual activity: Yes    OBJECTIVE:  VS:  HT:5\' 7"  (170.2 cm)   WT:238 lb (108 kg)  BMI:37.4    BP:104/76  HR:86bpm  TEMP: ( )  RESP:97 % Physical Exam  Constitutional: She appears well-developed and well-nourished. She is cooperative.  Non-toxic appearance.  HENT:  Head: Normocephalic and atraumatic.  Cardiovascular: Intact distal pulses.   Pulmonary/Chest: No accessory muscle usage. No respiratory distress.  Neurological: She is alert. She is not disoriented. She displays normal reflexes. No sensory deficit.  Skin: Skin is warm, dry and intact. Capillary refill takes less than 2 seconds. No abrasion and no rash noted.  Psychiatric: She has a normal mood and affect. Her speech is normal and behavior is normal. Thought content normal.   Right knee:  No significant rashes/lesions/ulcerations overlying the legs.  No significant bruising or scarring  No significant pretibial edema.  No clubbing or cyanosis.  DP & PT pulses 2+/4.  LE Sensation intact to light touch.  Slight genu valgus.  Otherwise well aligned..    Generalized synovitis with no significant effusion.    ROM: 0 to 120.    Extensor mechanism intact  Small amount of medial joint line pain.Marland Kitchen    3-4 mm opening with valgus testing.  Stable to anterior and posterior drawer.. Normal Lachman's.    Pain with McMurray's without mechanical clicking.   IMAGING & PROCEDURES: No results found.   Findings:  X-rays revealed mild degenerative change within the patellofemoral and medial compartment. Lower extremity venous  Doppler was negative for DVT.  No evidence of Baker's cyst.    PROCEDURE NOTE: ULTRASOUND GUIDED RIGHT KNEEINJECTION  Images were obtained and interpreted by myself, Teresa Coombs, DO  Images have been saved and stored to PACS system. Images obtained on: GE S7 Ultrasound machine  DESCRIPTION OF PROCEDURE:  The patient's clinical condition is marked by substantial pain and/or significant functional disability. Other conservative therapy has not provided relief, is contraindicated, or not appropriate. There is a reasonable likelihood that injection will significantly improve the patient's pain and/or functional impairment. After discussing the risks, benefits and expected outcomes of the injection and all questions were reviewed and answered, the patient wished to undergo the above named procedure. Verbal consent was obtained. The ultrasound was used to identify the target structure and adjacent neurovascular structures. The skin was then prepped in sterile fashion and the target structure was injected under direct visualization using sterile technique as below: PREP: Alcohol, Ethel Chloride APPROACH: Lateral, 21g  2" needle INJECTATE: 2cc 0.5% marcaine, 2cc 40mg  DepoMedrol ASPIRATE: N/A DRESSING: Band-Aid reaction knee brace  Post procedural instructions including recommending icing and warning signs for infection were reviewed. This procedure was well tolerated and there were no complications.   IMPRESSION: Succesful US Guided Injection   ASSESSMENT & PLAN:  Visit Diagnoses:  1. Right knee pain, unspecified chronicity   2. Primary osteoarthritis of right knee    Meds: No orders of the defined types were placed in this encounter.   Orders:  Orders Placed This Encounter  Procedures  . [place] LIMITED JOINT SPACE STRUCTURES LOW RIGHT(NO LINKED CHARGES)    Follow-up: Return in about 8 weeks (around 07/06/2016) for repeat clinical exam.  Otherwise please see problem oriented charting as  below.  CMA/ATC served as Education administrator during this visit. History, Physical, and Plan performed by medical provider. Documentation and orders reviewed and attested to.      Teresa Coombs, [facility] Physician    05/12/2016 6:03 AM

## 2016-05-18 DIAGNOSIS — F411 Generalized anxiety disorder: Secondary | ICD-10-CM | POA: Diagnosis not present

## 2016-05-18 DIAGNOSIS — F330 Major depressive disorder, recurrent, mild: Secondary | ICD-10-CM | POA: Diagnosis not present

## 2016-05-25 DIAGNOSIS — F330 Major depressive disorder, recurrent, mild: Secondary | ICD-10-CM | POA: Diagnosis not present

## 2016-05-25 DIAGNOSIS — F411 Generalized anxiety disorder: Secondary | ICD-10-CM | POA: Diagnosis not present

## 2016-06-01 DIAGNOSIS — F330 Major depressive disorder, recurrent, mild: Secondary | ICD-10-CM | POA: Diagnosis not present

## 2016-06-01 DIAGNOSIS — F411 Generalized anxiety disorder: Secondary | ICD-10-CM | POA: Diagnosis not present

## 2016-06-08 DIAGNOSIS — L700 Acne vulgaris: Secondary | ICD-10-CM | POA: Diagnosis not present

## 2016-06-08 DIAGNOSIS — L658 Other specified nonscarring hair loss: Secondary | ICD-10-CM | POA: Diagnosis not present

## 2016-06-29 ENCOUNTER — Ambulatory Visit (HOSPITAL_COMMUNITY)
Admission: EM | Admit: 2016-06-29 | Discharge: 2016-06-29 | Disposition: A | Discharge status: Home / Self Care | Payer: Federal, State, Local not specified - PPO | Attending: Internal Medicine | Admitting: Internal Medicine

## 2016-06-29 ENCOUNTER — Encounter (HOSPITAL_COMMUNITY): Payer: Self-pay | Admitting: Emergency Medicine

## 2016-06-29 DIAGNOSIS — F411 Generalized anxiety disorder: Secondary | ICD-10-CM | POA: Diagnosis not present

## 2016-06-29 DIAGNOSIS — F440 Dissociative amnesia: Secondary | ICD-10-CM | POA: Diagnosis not present

## 2016-06-29 DIAGNOSIS — M6530 Trigger finger, unspecified finger: Secondary | ICD-10-CM

## 2016-06-29 MED ORDER — NAPROXEN 500 MG PO TABS: 500.0000 mg | ORAL_TABLET | Freq: Two times a day (BID) | ORAL | 0 refills | Status: DC

## 2016-06-29 NOTE — Discharge Instructions (Addendum)
No danger signs on exam.  Finger findings seem most consistent with trigger finger or possibly a ganglion cyst.  Wear splint to rest finger and let inflammation/irritation around joint settle down.  Prescription for anti inflammatory/pain reliever, naproxen, was sent to the pharmacy.  Ice for 5-10 minutes several times daily may reduce pain/irritation at joint.  Anticipate gradual improvement over the next several days.  Recheck or followup with a sports med provider to discuss further options for managing finger.

## 2016-06-29 NOTE — ED Triage Notes (Signed)
Pt reports a painful bump on right 4th digit and pain radiates to entire hand  Denies inj/trauma, strenuous activity   A&O x4... NAD... Ambulatory

## 2016-06-29 NOTE — ED Provider Notes (Signed)
[facility]    CSN: [id number] Arrival date & time: 06/29/16  1919     History   Chief Complaint Chief Complaint  Patient presents with  . Mass    HPI Tiffany [name] is a 41 y.o. female. She presents today with the fairly abrupt appearance of a tiny painful cystic nodule at the palmar surface of her right fourth PIP, just proximal to the joint. This catches when she flexes her finger, and makes it difficult to fully extend her finger. Pain radiates down into her hand. No recent injury, no animal bite. 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    HPI  Past Medical History:  Diagnosis Date  . Anemia   . Anxiety   . Chicken pox 1981  . Depression   . GERD (gastroesophageal reflux disease)   . Hay fever 2015  . PTSD (post-traumatic stress disorder)   . Umbilical hernia     Patient Active Problem List   Diagnosis Date Noted  . Primary osteoarthritis of right knee 05/11/2016  . Encounter for preventative adult health care exam with abnormal findings 12/23/2015  . Stuttering 12/23/2015  . Allergy to penicillin 10/08/2014  . Panic disorder without agoraphobia with severe panic attacks 08/05/2014  . Depression, major, recurrent, moderate ([place]) 07/02/2014    Class: Chronic  . Generalized anxiety disorder 07/02/2014    Class: Chronic  . Right sided abdominal pain 02/20/2014  . Urinary incontinence in female 02/20/2014  . Post-operative pain 01/28/2014  . Ventral hernia 05/09/2013    Past Surgical History:  Procedure Laterality Date  . ABDOMINAL HYSTERECTOMY Bilateral 01/06/2014   Procedure: HYSTERECTOMY ABDOMINAL WITH BILATERAL SALPINGECTOMY ;  Surgeon: Delice Lesch, MD;  Location: [facility];  Service: Gynecology;  Laterality: Bilateral;  . ABDOMINAL HYSTERECTOMY  2016  . APPENDECTOMY     2002  . CESAREAN SECTION     2003  . LAPAROSCOPIC HYSTERECTOMY N/A 01/06/2014   Procedure: Attempted  TOTAL LAPAROSCOPIC Hysterectomy,;  Surgeon: Delice Lesch, MD;  Location:  [facility];  Service: Gynecology;  Laterality: N/A;  . TRANSVERSE COLON RESECTION N/A 01/06/2014   Procedure: OVERSEW OF SEROSA TEAR OF THE TRANSVERSE COLON ;  Surgeon: Alphonsa Overall, MD;  Location: [facility];  Service: General;  Laterality: N/A;  . TUBAL LIGATION     2013    OB History    Gravida Para Term Preterm AB Living   4 3 3   1 7    SAB TAB Ectopic Multiple Live Births   1       3       Home Medications    Prior to Admission medications   Medication Sig Start Date End Date Taking? Authorizing Provider  buPROPion (WELLBUTRIN XL) 150 MG 24 hr tablet Take 150 mg by mouth daily.   Yes [provider]  busPIRone (BUSPAR) 15 MG tablet Take 15 mg by mouth 3 (three) times daily.   Yes [provider]  clobetasol ointment (TEMOVATE) 4.09 % Apply 1 application topically daily.    Yes [provider]  Multiple Vitamin (MULTIVITAMIN) capsule Take 1 capsule by mouth daily.   Yes [provider]  traZODone (DESYREL) 50 MG tablet Take 1 tablet by mouth at bedtime. 08/24/14  Yes [provider]  bismuth subsalicylate (PEPTO-BISMOL) 262 MG/15ML suspension Take 30 mLs by mouth every 6 (six) hours as needed. 05/09/16   Nche, Charlene Brooke, NP  mometasone (NASONEX) 50 MCG/ACT nasal spray Place 2 sprays into the  nose daily. 01/04/16   Melynda Ripple, MD  naproxen (NAPROSYN) 500 MG tablet Take 1 tablet (500 mg total) by mouth 2 (two) times daily. 06/29/16   Sherlene Shams, MD  ondansetron (ZOFRAN) 4 MG tablet Take 1 tablet (4 mg total) by mouth every 8 (eight) hours as needed for nausea or vomiting. 05/09/16   Nche, Charlene Brooke, NP    Family History Family History  Problem Relation Age of Onset  . Heart disease Mother 21       heart attack  . Depression Mother   . Hyperlipidemia Mother   . Hypertension Father 91  . Drug abuse Father   . Depression Maternal Aunt   . Anxiety disorder Maternal Aunt   . Anxiety disorder Paternal Grandmother   . Arthritis  Paternal Grandmother   . Depression Paternal Grandmother   . Depression Maternal Grandmother   . Depression Maternal Grandfather   . Depression Paternal Grandfather     Social History Social History  Substance Use Topics  . Smoking status: Never Smoker  . Smokeless tobacco: Never Used  . Alcohol use No     Allergies   Mango flavor; Amoxicillin; and Penicillins   Review of Systems Review of Systems  All other systems reviewed and are negative.    Physical Exam Triage Vital Signs ED Triage Vitals  Enc Vitals Group     BP 06/29/16 2018 129/68     Pulse Rate 06/29/16 2018 79     Resp 06/29/16 2018 20     Temp 06/29/16 2018 98.4 F (36.9 C)     Temp Source 06/29/16 2018 Oral     SpO2 06/29/16 2018 100 %     Weight --      Height --      Pain Score 06/29/16 2016 5     Pain Loc --    Updated Vital Signs BP 129/68 (BP Location: Right Arm)   Pulse 79   Temp 98.4 F (36.9 C) (Oral)   Resp 20   LMP 10/24/2013   SpO2 100%   Physical Exam  Constitutional: She is oriented to person, place, and time. No distress.  HENT:  Head: Atraumatic.  Eyes:  Conjugate gaze observed, no eye redness/discharge  Neck: Neck supple.  Cardiovascular: Normal rate.   Pulmonary/Chest: No respiratory distress.  Abdominal: She exhibits no distension.  Musculoskeletal: Normal range of motion.  Palmar surface of the right hand, just proximal to the fourth PIP, there is a 3 mm cystic swelling, somewhat tender to touch, with slight puffiness of the proximal phalanx. No erythema. No skin lesion. Able to make a fist, able to fully flex her finger and fully extend, although it is painful.  Neurological: She is alert and oriented to person, place, and time.  Skin: Skin is warm and dry.  Nursing note and vitals reviewed.    UC Treatments / Results   Procedures Procedures (including critical care time) Splinted in position of function by clinical staff  Final Clinical Impressions(s) / UC  Diagnoses   Final diagnoses:  Trigger finger, acquired   No danger signs on exam.  Finger findings seem most consistent with trigger finger or possibly a ganglion cyst.  Wear splint to rest finger and let inflammation/irritation around joint settle down.  Prescription for anti inflammatory/pain reliever, naproxen, was sent to the pharmacy.  Ice for 5-10 minutes several times daily may reduce pain/irritation at joint.  Anticipate gradual improvement over the next several days.  Recheck or followup  with a sports med provider to discuss further options for managing finger.    New Prescriptions New Prescriptions   NAPROXEN (NAPROSYN) 500 MG TABLET    Take 1 tablet (500 mg total) by mouth 2 (two) times daily.     Sherlene Shams, MD 07/01/16 1044

## 2016-07-01 ENCOUNTER — Emergency Department (HOSPITAL_COMMUNITY): Payer: Federal, State, Local not specified - PPO

## 2016-07-01 ENCOUNTER — Emergency Department (HOSPITAL_COMMUNITY)
Admission: EM | Admit: 2016-07-01 | Discharge: 2016-07-01 | Disposition: A | Discharge status: Home / Self Care | Payer: Federal, State, Local not specified - PPO | Attending: Emergency Medicine | Admitting: Emergency Medicine

## 2016-07-01 ENCOUNTER — Emergency Department (HOSPITAL_COMMUNITY)
Admission: EM | Admit: 2016-07-01 | Discharge: 2016-07-01 | Disposition: A | Discharge status: Home / Self Care | Payer: Federal, State, Local not specified - PPO

## 2016-07-01 ENCOUNTER — Encounter (HOSPITAL_COMMUNITY): Payer: Self-pay

## 2016-07-01 DIAGNOSIS — M25571 Pain in right ankle and joints of right foot: Secondary | ICD-10-CM

## 2016-07-01 DIAGNOSIS — M545 Low back pain, unspecified: Secondary | ICD-10-CM

## 2016-07-01 DIAGNOSIS — Y929 Unspecified place or not applicable: Secondary | ICD-10-CM | POA: Insufficient documentation

## 2016-07-01 DIAGNOSIS — Y9301 Activity, walking, marching and hiking: Secondary | ICD-10-CM | POA: Insufficient documentation

## 2016-07-01 DIAGNOSIS — Y999 Unspecified external cause status: Secondary | ICD-10-CM | POA: Diagnosis not present

## 2016-07-01 DIAGNOSIS — Z79899 Other long term (current) drug therapy: Secondary | ICD-10-CM | POA: Diagnosis not present

## 2016-07-01 DIAGNOSIS — M62830 Muscle spasm of back: Secondary | ICD-10-CM | POA: Diagnosis not present

## 2016-07-01 DIAGNOSIS — S93401A Sprain of unspecified ligament of right ankle, initial encounter: Secondary | ICD-10-CM

## 2016-07-01 DIAGNOSIS — W0110XA Fall on same level from slipping, tripping and stumbling with subsequent striking against unspecified object, initial encounter: Secondary | ICD-10-CM | POA: Insufficient documentation

## 2016-07-01 DIAGNOSIS — W19XXXA Unspecified fall, initial encounter: Secondary | ICD-10-CM

## 2016-07-01 DIAGNOSIS — S99911A Unspecified injury of right ankle, initial encounter: Secondary | ICD-10-CM | POA: Diagnosis not present

## 2016-07-01 MED ORDER — CYCLOBENZAPRINE HCL 10 MG PO TABS: 10.0000 mg | ORAL_TABLET | Freq: Three times a day (TID) | ORAL | 0 refills | Status: DC | PRN

## 2016-07-01 MED ORDER — NAPROXEN 500 MG PO TABS: 500.0000 mg | ORAL_TABLET | Freq: Two times a day (BID) | ORAL | 0 refills | Status: DC | PRN

## 2016-07-01 MED ORDER — HYDROCODONE-ACETAMINOPHEN 5-325 MG PO TABS
1.0000 | ORAL_TABLET | Freq: Once | ORAL | Status: AC
  Administered 2016-07-01: 1 via ORAL
  Filled 2016-07-01: qty 1

## 2016-07-01 NOTE — ED Triage Notes (Signed)
PT C/O RIGHT ANKLE AND RIGHT LOWER BACK PAIN X3 HOURS AGO.PT STS SHE WAS WALKING THROUGH [place], WHEN SHE STEPPED INTO A HOLE AND TWISTED THE ANKLE AND INJURING HER LOWER BACK. DENIES HEAD INJURY OR LOC.

## 2016-07-01 NOTE — ED Provider Notes (Signed)
[facility] DEPT Provider Note   CSN: [id number] Arrival date & time: 07/01/16  2022     History   Chief Complaint Chief Complaint  Patient presents with  . Fall    HPI Tiffany [name] is a 41 y.o. female with a PMHx of GERD, PTSD, anemia, anxiety, and depression, and PSHx of hysterectomy and tubal ligation, who presents to the ED with complaints of a chemical fall that occurred 3 hours ago when she was walking in the park and her right foot went into a hole causing her right ankle to twist and causing her to "tweak" her right lower back. She reports right ankle and low back pain. She denies head injury or LOC. She describes the pain in her ankle is 8/10 constant throbbing right ankle pain that radiates up to the right lower back, worse with movement and trying to stand, and with no treatments tried prior to arrival. She reports associated right lower back pain and right ankle swelling. She is not on any blood thinners. She denies any bruising or abrasions, incontinence of urine or stool, saddle anesthesia or cauda equina symptoms, numbness, tingling, focal weakness, or any other injuries or complaints at this time.   The history is provided by the patient and medical records. No language interpreter was used.  Ankle Pain   The incident occurred 3 to 5 hours ago. The incident occurred at the park. The injury mechanism was a fall. The pain is present in the right ankle. The quality of the pain is described as throbbing. The pain is at a severity of 8/10. The pain is moderate. The pain has been constant since onset. Pertinent negatives include no numbness, no muscle weakness, no loss of sensation and no tingling. She reports no foreign bodies present. The symptoms are aggravated by bearing weight and activity. She has tried nothing for the symptoms. The treatment provided no relief.    Past Medical History:  Diagnosis Date  . Anemia   . Anxiety   . Chicken pox 1981  . Depression   . GERD  (gastroesophageal reflux disease)   . Hay fever 2015  . PTSD (post-traumatic stress disorder)   . Umbilical hernia     Patient Active Problem List   Diagnosis Date Noted  . Primary osteoarthritis of right knee 05/11/2016  . Encounter for preventative adult health care exam with abnormal findings 12/23/2015  . Stuttering 12/23/2015  . Allergy to penicillin 10/08/2014  . Panic disorder without agoraphobia with severe panic attacks 08/05/2014  . Depression, major, recurrent, moderate ([place]) 07/02/2014    Class: Chronic  . Generalized anxiety disorder 07/02/2014    Class: Chronic  . Right sided abdominal pain 02/20/2014  . Urinary incontinence in female 02/20/2014  . Post-operative pain 01/28/2014  . Ventral hernia 05/09/2013    Past Surgical History:  Procedure Laterality Date  . ABDOMINAL HYSTERECTOMY Bilateral 01/06/2014   Procedure: HYSTERECTOMY ABDOMINAL WITH BILATERAL SALPINGECTOMY ;  Surgeon: Delice Lesch, MD;  Location: [facility];  Service: Gynecology;  Laterality: Bilateral;  . ABDOMINAL HYSTERECTOMY  2016  . APPENDECTOMY     2002  . CESAREAN SECTION     2003  . LAPAROSCOPIC HYSTERECTOMY N/A 01/06/2014   Procedure: Attempted  TOTAL LAPAROSCOPIC Hysterectomy,;  Surgeon: Delice Lesch, MD;  Location: [facility];  Service: Gynecology;  Laterality: N/A;  . TRANSVERSE COLON RESECTION N/A 01/06/2014   Procedure: OVERSEW OF SEROSA TEAR OF THE TRANSVERSE COLON ;  Surgeon: Alphonsa Overall, MD;  Location: [facility]  [facility];  Service: General;  Laterality: N/A;  . TUBAL LIGATION     2013    OB History    Gravida Para Term Preterm AB Living   4 3 3   1 7    SAB TAB Ectopic Multiple Live Births   1       3       Home Medications    Prior to Admission medications   Medication Sig Start Date End Date Taking? Authorizing Provider  bismuth subsalicylate (PEPTO-BISMOL) 262 MG/15ML suspension Take 30 mLs by mouth every 6 (six) hours as needed. 05/09/16   Nche, Charlene Brooke, NP  buPROPion  (WELLBUTRIN XL) 150 MG 24 hr tablet Take 150 mg by mouth daily.    [provider]  busPIRone (BUSPAR) 15 MG tablet Take 15 mg by mouth 3 (three) times daily.    [provider]  clobetasol ointment (TEMOVATE) 4.70 % Apply 1 application topically daily.     [provider]  mometasone (NASONEX) 50 MCG/ACT nasal spray Place 2 sprays into the nose daily. 01/04/16   Melynda Ripple, MD  Multiple Vitamin (MULTIVITAMIN) capsule Take 1 capsule by mouth daily.    [provider]  naproxen (NAPROSYN) 500 MG tablet Take 1 tablet (500 mg total) by mouth 2 (two) times daily. 06/29/16   Sherlene Shams, MD  ondansetron (ZOFRAN) 4 MG tablet Take 1 tablet (4 mg total) by mouth every 8 (eight) hours as needed for nausea or vomiting. 05/09/16   Nche, Charlene Brooke, NP  traZODone (DESYREL) 50 MG tablet Take 1 tablet by mouth at bedtime. 08/24/14   [provider]    Family History Family History  Problem Relation Age of Onset  . Heart disease Mother 4       heart attack  . Depression Mother   . Hyperlipidemia Mother   . Hypertension Father 45  . Drug abuse Father   . Depression Maternal Aunt   . Anxiety disorder Maternal Aunt   . Anxiety disorder Paternal Grandmother   . Arthritis Paternal Grandmother   . Depression Paternal Grandmother   . Depression Maternal Grandmother   . Depression Maternal Grandfather   . Depression Paternal Grandfather     Social History Social History  Substance Use Topics  . Smoking status: Never Smoker  . Smokeless tobacco: Never Used  . Alcohol use No     Allergies   Mango flavor; Amoxicillin; and Penicillins   Review of Systems Review of Systems  HENT: Negative for facial swelling (no head inj).   Genitourinary: Negative for difficulty urinating (no incontinence).  Musculoskeletal: Positive for arthralgias, back pain and joint swelling.  Skin: Negative for color change and wound.  Allergic/Immunologic: Negative for  immunocompromised state.  Neurological: Negative for tingling, syncope, weakness and numbness.  Hematological: Does not bruise/bleed easily.  Psychiatric/Behavioral: Negative for confusion.     Physical Exam Updated Vital Signs BP 138/81 (BP Location: Left Arm)   Pulse 74   Temp 98 F (36.7 C) (Oral)   Resp 18   Ht 5\' 7"  (1.702 m)   Wt 105.2 kg (232 lb)   LMP 10/24/2013   SpO2 100%   BMI 36.34 kg/m   Physical Exam  Constitutional: She is oriented to person, place, and time. Vital signs are normal. She appears well-developed and well-nourished.  Non-toxic appearance. No distress.  Afebrile, nontoxic, NAD  HENT:  Head: Normocephalic and atraumatic.  Mouth/Throat: Mucous membranes are normal.  Eyes: Conjunctivae and EOM are normal.  Right eye exhibits no discharge. Left eye exhibits no discharge.  Neck: Normal range of motion. Neck supple.  Cardiovascular: Normal rate and intact distal pulses.   Pulmonary/Chest: Effort normal. No respiratory distress.  Abdominal: Normal appearance. She exhibits no distension.  Musculoskeletal:       Right ankle: She exhibits decreased range of motion (due to pain) and swelling. She exhibits no ecchymosis, no deformity, no laceration and normal pulse. Tenderness. Lateral malleolus tenderness found. Achilles tendon normal.       Lumbar back: She exhibits tenderness and spasm. She exhibits normal range of motion, no bony tenderness, no swelling and no deformity.       Back:  R ankle with mildly limited ROM due to pain, +swelling, ?crepitus along lateral malleolus but without deformity, with moderate TTP of the lateral malleolus, but no TTP or swelling of fore foot or calf. No break in skin. No bruising or erythema. No warmth. Achilles intact. Good pedal pulse and cap refill of all toes. Wiggling toes without difficulty. Sensation grossly intact. Soft compartments  Lumbar spine with FROM intact without spinous process TTP, no bony stepoffs or  deformities, with mild R sided paraspinous muscle TTP and muscle spasms. Strength and sensation grossly intact in all extremities, negative SLR bilaterally. No overlying skin changes. Distal pulses intact. Gait not initially assessed due to ankle complaint  Neurological: She is alert and oriented to person, place, and time. She has normal strength. No sensory deficit.  Skin: Skin is warm, dry and intact. No rash noted.  Psychiatric: She has a normal mood and affect. Her behavior is normal.  Nursing note and vitals reviewed.    ED Treatments / Results  Labs (all labs ordered are listed, but only abnormal results are displayed) Labs Reviewed - No data to display  EKG  EKG Interpretation None       Radiology Dg Ankle Complete Right  Result Date: 07/01/2016 CLINICAL DATA:  Right ankle pain after tripping into a hole. EXAM: RIGHT ANKLE - COMPLETE 3+ VIEW COMPARISON:  None. FINDINGS: There is no evidence of fracture, dislocation, or joint effusion. Small plantar calcaneal enthesophyte. Slight soft tissue swelling over the lateral malleolus. IMPRESSION: Soft tissue swelling without acute osseous abnormality of the right ankle. No dislocation. Electronically Signed   By: Ashley Royalty M.D.   On: 07/01/2016 21:55    Procedures Procedures (including critical care time)  Medications Ordered in ED Medications  HYDROcodone-acetaminophen (NORCO/VICODIN) 5-325 MG per tablet 1 tablet (1 tablet Oral Given 07/01/16 2210)     Initial Impression / Assessment and Plan / ED Course  I have reviewed the triage vital signs and the nursing notes.  Pertinent labs & imaging results that were available during my care of the patient were reviewed by me and considered in my medical decision making (see chart for details).     41 y.o. female here with R ankle pain/swelling after twisting it when she accidentally stepped [place] a hole at the park. Also reports R low back pain. On exam, R ankle with moderate  lateral malleolus TTP, +swelling, no bruising, skin intact, ?crepitus felt along lateral malleolus but no deformity; mild R lumbar paraspinous TTP and spasm but no midline spinal tenderness. No red flag s/s of low back pain. No s/s of central cord compression or cauda equina. Lower extremities are neurovascularly intact. Will get xray of ankle, but doubt need for xray of low back, likely just muscle strain. Will give pain meds then reassess shortly  10:27 PM Xray ankle negative for acute osseous injury. Likely sprain. Will apply ASO brace and give crutches for comfort, advised RICE; for her back strain, advised heat use and tylenol/naprosyn use for pain. Will rx flexeril as well, discussed proper use of this and not combining with alcohol or driving while taking it. F/up with ortho in 1-2wks for recheck of ankle, and with PCP in 1-2wks for recheck of back pain. I explained the diagnosis and have given explicit precautions to return to the ER including for any other new or worsening symptoms. The patient understands and accepts the medical plan as it's been dictated and I have answered their questions. Discharge instructions concerning home care and prescriptions have been given. The patient is STABLE and is discharged to home in good condition.    Final Clinical Impressions(s) / ED Diagnoses   Final diagnoses:  Sprain of right ankle, unspecified ligament, initial encounter  Acute right ankle pain  Fall, initial encounter  Acute right-sided low back pain without sciatica  Back muscle spasm    New Prescriptions New Prescriptions   CYCLOBENZAPRINE (FLEXERIL) 10 MG TABLET    Take 1 tablet (10 mg total) by mouth 3 (three) times daily as needed for muscle spasms.   NAPROXEN (NAPROSYN) 500 MG TABLET    Take 1 tablet (500 mg total) by mouth 2 (two) times daily as needed for mild pain, moderate pain or headache (TAKE WITH MEALS.).     [address] [place], [place] 07/01/16 2229    Quintella Reichert,  MD 07/02/16 [phone]

## 2016-07-01 NOTE — ED Notes (Signed)
Patient transported to X-ray 

## 2016-07-01 NOTE — Discharge Instructions (Signed)
Wear ankle brace for at least 2 weeks for stabilization of ankle. Use crutches as needed for comfort. Ice and elevate ankle throughout the day, using ice pack for no more than 20 minutes every hour.  Alternate between naprosyn and tylenol for pain relief. Use flexeril as directed as needed for muscle spasms, but do not drive or operate machinery with muscle relaxant medication use. Use heat to your back, using heat pad for no more than 20 minutes per hour. Call orthopedic follow up today or tomorrow to schedule followup appointment for recheck of ongoing ankle pain in 1-2 weeks. Follow up with your regular doctor in the next 1-2 weeks for recheck of symptoms. Return to the ER for changes or worsening symptoms.

## 2016-07-03 ENCOUNTER — Telehealth: Payer: Self-pay | Admitting: Nurse Practitioner

## 2016-07-03 NOTE — Telephone Encounter (Signed)
I resched appt for 06/13 at 11am b/c she fell twice in the past week or so and was in ED and UC. I sched as a 47min Off visit.  See appt notes.   Thank you,  -LL

## 2016-07-04 NOTE — Telephone Encounter (Signed)
Noted  

## 2016-07-05 ENCOUNTER — Encounter: Payer: Self-pay | Admitting: Sports Medicine

## 2016-07-05 ENCOUNTER — Ambulatory Visit (INDEPENDENT_AMBULATORY_CARE_PROVIDER_SITE_OTHER): Payer: Federal, State, Local not specified - PPO | Admitting: Sports Medicine

## 2016-07-05 ENCOUNTER — Ambulatory Visit (INDEPENDENT_AMBULATORY_CARE_PROVIDER_SITE_OTHER): Payer: Federal, State, Local not specified - PPO

## 2016-07-05 VITALS — BP 100/80 | HR 70

## 2016-07-05 DIAGNOSIS — M1711 Unilateral primary osteoarthritis, right knee: Secondary | ICD-10-CM

## 2016-07-05 DIAGNOSIS — S93491A Sprain of other ligament of right ankle, initial encounter: Secondary | ICD-10-CM | POA: Insufficient documentation

## 2016-07-05 DIAGNOSIS — M545 Low back pain, unspecified: Secondary | ICD-10-CM

## 2016-07-05 DIAGNOSIS — M79671 Pain in right foot: Secondary | ICD-10-CM

## 2016-07-05 DIAGNOSIS — S99921A Unspecified injury of right foot, initial encounter: Secondary | ICD-10-CM | POA: Diagnosis not present

## 2016-07-05 DIAGNOSIS — S93431A Sprain of tibiofibular ligament of right ankle, initial encounter: Secondary | ICD-10-CM

## 2016-07-05 DIAGNOSIS — M65341 Trigger finger, right ring finger: Secondary | ICD-10-CM | POA: Diagnosis not present

## 2016-07-05 MED ORDER — NAPROXEN-ESOMEPRAZOLE 500-20 MG PO TBEC: 1.0000 | DELAYED_RELEASE_TABLET | Freq: Two times a day (BID) | ORAL | 0 refills | Status: AC

## 2016-07-05 MED ORDER — NAPROXEN-ESOMEPRAZOLE 500-20 MG PO TBEC: 1.0000 | DELAYED_RELEASE_TABLET | Freq: Two times a day (BID) | ORAL | 2 refills | Status: DC

## 2016-07-05 NOTE — Progress Notes (Signed)
OFFICE VISIT NOTE Tiffany [name]. [place], [place] at [facility] [phone]  Jamariyah Johannsen - 41 y.o. female MRN [id number]  Date of birth: [birth year]  Visit Date: 07/05/2016  PCP: Flossie Buffy, NP   Referred by: Flossie Buffy, NP  [name], cma acting as scribe for Dr. Paulla Fore.  SUBJECTIVE:   Chief Complaint  Patient presents with  . Osteoarthritis Right Knee  . Sprained Ankle--RT  . Trigger Finger, Right 4th   HPI: As below and per problem based documentation when appropriate.  Tiffany [name] presents as a follow up from last OV on 05/11/2016 and two ER visits on 06/29/2016 and 07/01/2016.   Osteoarthritis Right Knee: Reports Improvement with Steroid injection on 05/12/2015 but aggrevated it on 07/01/2016 when she had a fall. Pain seems worse now then initial onset. Location of pain is the medial and lateral sides of knee. Using knee brace only when leaving her house, occasionally will wear at home if needed. Mainly this is for support.  Alternating ice and heat to the area, ice is painful but gets relief with heat.    Sprained Ankle--RT: The incident occurred on 07/01/2016, treated at ER. [place] the park. Today pain and swelling is present in ankle. Xray did not show any fracture. She is wearing ankle brace around the clock even at bedtime. Taking Naproxen 500mg  prn which does help.   Trigger Finger, Right 4th: Pt presented to ER with the fairly abrupt appearance of a tiny painful cystic nodule at the palmar surface of her right fourth PIP, just proximal to the joint. The area and swelling has improved since visit.  Persistent Pain radiates down into her hand and arm. They splinted her hand, but was interfering with her job and school due to the amount of typing she does.   Right Side/Back Pain: Improved since onset. Tweaked area due to fall. Taking cyclobenzaprine once per day with relief. Describes the pain as sharp with radiation  from lower back to mid back. Using heating pad at bedtime.    Review of Systems  Constitutional: Negative for chills, diaphoresis and fever.  HENT: Negative.   Eyes: Negative.   Respiratory: Negative.   Cardiovascular: Negative for chest pain and palpitations.  Gastrointestinal: Negative.   Genitourinary: Negative.   Musculoskeletal: Positive for back pain, falls and joint pain.  Skin: Negative.   Neurological: Negative for dizziness, tingling, focal weakness, weakness and headaches.  Endo/Heme/Allergies: Negative.   Psychiatric/Behavioral: Negative.     Otherwise per HPI.  HISTORY & PERTINENT PRIOR DATA:  No specialty comments available. She reports that she has never smoked. She has never used smokeless tobacco. No results for input(s): HGBA1C, LABURIC in the last 8760 hours. Medications & Allergies reviewed per EMR Patient Active Problem List   Diagnosis Date Noted  . Trigger ring finger of right hand 07/05/2016  . High ankle sprain of right lower extremity 07/05/2016  . Primary osteoarthritis of right knee 05/11/2016  . Encounter for preventative adult health care exam with abnormal findings 12/23/2015  . Stuttering 12/23/2015  . Allergy to penicillin 10/08/2014  . Panic disorder without agoraphobia with severe panic attacks 08/05/2014  . Depression, major, recurrent, moderate ([place]) 07/02/2014    Class: Chronic  . Generalized anxiety disorder 07/02/2014    Class: Chronic  . Right sided abdominal pain 02/20/2014  . Urinary incontinence in female 02/20/2014  . Post-operative pain 01/28/2014  . Ventral hernia 05/09/2013   Past Medical  History:  Diagnosis Date  . Anemia   . Anxiety   . Chicken pox 1981  . Depression   . GERD (gastroesophageal reflux disease)   . Hay fever 2015  . PTSD (post-traumatic stress disorder)   . Umbilical hernia    Family History  Problem Relation Age of Onset  . Heart disease Mother 76       heart attack  . Depression Mother   .  Hyperlipidemia Mother   . Hypertension Father 28  . Drug abuse Father   . Depression Maternal Aunt   . Anxiety disorder Maternal Aunt   . Anxiety disorder Paternal Grandmother   . Arthritis Paternal Grandmother   . Depression Paternal Grandmother   . Depression Maternal Grandmother   . Depression Maternal Grandfather   . Depression Paternal Grandfather    Past Surgical History:  Procedure Laterality Date  . ABDOMINAL HYSTERECTOMY Bilateral 01/06/2014   Procedure: HYSTERECTOMY ABDOMINAL WITH BILATERAL SALPINGECTOMY ;  Surgeon: Delice Lesch, MD;  Location: [facility];  Service: Gynecology;  Laterality: Bilateral;  . ABDOMINAL HYSTERECTOMY  2016  . APPENDECTOMY     2002  . CESAREAN SECTION     2003  . LAPAROSCOPIC HYSTERECTOMY N/A 01/06/2014   Procedure: Attempted  TOTAL LAPAROSCOPIC Hysterectomy,;  Surgeon: Delice Lesch, MD;  Location: [facility];  Service: Gynecology;  Laterality: N/A;  . TRANSVERSE COLON RESECTION N/A 01/06/2014   Procedure: OVERSEW OF SEROSA TEAR OF THE TRANSVERSE COLON ;  Surgeon: Alphonsa Overall, MD;  Location: [facility];  Service: General;  Laterality: N/A;  . TUBAL LIGATION     2013   Social History   Occupational History  . Not on file.   Social History Main Topics  . Smoking status: Never Smoker  . Smokeless tobacco: Never Used  . Alcohol use No  . Drug use: No  . Sexual activity: Yes    OBJECTIVE:  VS:  HT:    WT:   BMI:     BP:100/80  HR:70bpm  TEMP: ( )  RESP:  EXAM: Findings:  WDWN, NAD, Non-toxic appearing Alert & appropriately interactive Not depressed or anxious appearing No increased work of breathing. Pupils are equal. EOM intact without nystagmus No clubbing or cyanosis of the extremities appreciated No significant rashes/lesions/ulcerations overlying the examined area. DP and PT pulses 1+/4.  Trace pretibial edema.   Sensation intact to light touch in lower extremities.  Right ankle: Overall well aligned.  She has a small  amount of swelling over the anterior lateral ankle.  She has pain with stressing of the ATFL.  No pain with cotton testing.  No pain with talar tilt and Kleiger testing; stable exam. No pain over the posterior tibialis tendon or Achilles.  She does have a small pain along the calcaneus at its plantar surface but this is minimal.  Right knee: Overall well aligned.  She does not small amount of swelling today with only a small effusion.  She is stable ligamentously but does have pain on the medial and lateral joint lines which is mild.  Right hand: She is a small palpable nodule at the A1 pulley with a small amount of triggering and is mildly painful.     Dg Ankle Complete Right  Result Date: 07/01/2016 CLINICAL DATA:  Right ankle pain after tripping into a hole. EXAM: RIGHT ANKLE - COMPLETE 3+ VIEW COMPARISON:  None. FINDINGS: There is no evidence of fracture, dislocation, or joint effusion. Small plantar calcaneal enthesophyte. Slight soft tissue  swelling over the lateral malleolus. IMPRESSION: Soft tissue swelling without acute osseous abnormality of the right ankle. No dislocation. Electronically Signed   By: Ashley Royalty M.D.   On: 07/01/2016 21:55   Dg Foot Complete Right  Result Date: 07/05/2016 CLINICAL DATA:  Right foot pain following fall several days ago, initial encounter EXAM: RIGHT FOOT COMPLETE - 3+ VIEW COMPARISON:  None. FINDINGS: There is no evidence of fracture or dislocation. There is no evidence of arthropathy or other focal bone abnormality. Soft tissues are unremarkable. IMPRESSION: No acute abnormality noted. Electronically Signed   By: Inez Catalina M.D.   On: 07/05/2016 13:19   ASSESSMENT & PLAN:   Problem List Items Addressed This Visit    Primary osteoarthritis of right knee    Underwent injection on 04/2016 with some reinjury after.  Only a small amount of swelling today.  NSAIDs as indicated. Continue with VMO strengthening and bracing/compression.      Relevant  Medications   Naproxen-Esomeprazole (VIMOVO) 500-20 MG TBEC   Trigger ring finger of right hand    Injection deferred at patient's request today.  She will return for injection if worsening symptoms.      Relevant Medications   Naproxen-Esomeprazole (VIMOVO) 500-20 MG TBEC   High ankle sprain of right lower extremity    She has pain across the anterior aspect of the ankle consistent with a high ankle sprain.  ASO recommended and she will begin working on phase 1 rehab..       Other Visit Diagnoses    Right foot pain    -  Primary   Relevant Orders   DG Foot Complete Right (Completed)   Acute right-sided low back pain without sciatica       secondary to fall on 07/01/16. Improving gradually.  Will address if any lack of improvment. No red flags   Relevant Medications   Naproxen-Esomeprazole (VIMOVO) 500-20 MG TBEC      Follow-up: Return in about 2 weeks (around 07/19/2016) for repeat clinical exam.   CMA/ATC served as scribe during this visit. History, Physical, and Plan performed by medical provider. Documentation and orders reviewed and attested to.      Teresa Coombs, [facility] Physician

## 2016-07-05 NOTE — Progress Notes (Signed)
Verbal order received for patient to receive sample medication. Patient teaching completed by provider.

## 2016-07-06 ENCOUNTER — Ambulatory Visit: Payer: Federal, State, Local not specified - PPO | Admitting: Sports Medicine

## 2016-07-16 NOTE — Assessment & Plan Note (Signed)
Injection deferred at patient's request today.  She will return for injection if worsening symptoms.

## 2016-07-16 NOTE — Assessment & Plan Note (Signed)
Underwent injection on 04/2016 with some reinjury after.  Only a small amount of swelling today.  NSAIDs as indicated. Continue with VMO strengthening and bracing/compression.

## 2016-07-16 NOTE — Assessment & Plan Note (Signed)
She has pain across the anterior aspect of the ankle consistent with a high ankle sprain.  ASO recommended and she will begin working on phase 1 rehab.Marland Kitchen

## 2016-07-24 ENCOUNTER — Encounter: Payer: Self-pay | Admitting: Sports Medicine

## 2016-07-24 ENCOUNTER — Ambulatory Visit (INDEPENDENT_AMBULATORY_CARE_PROVIDER_SITE_OTHER): Payer: Federal, State, Local not specified - PPO | Admitting: Sports Medicine

## 2016-07-24 VITALS — BP 104/76 | HR 83 | Ht 67.0 in | Wt 249.0 lb

## 2016-07-24 DIAGNOSIS — M1711 Unilateral primary osteoarthritis, right knee: Secondary | ICD-10-CM

## 2016-07-24 DIAGNOSIS — M67442 Ganglion, left hand: Secondary | ICD-10-CM

## 2016-07-24 DIAGNOSIS — S93431A Sprain of tibiofibular ligament of right ankle, initial encounter: Secondary | ICD-10-CM | POA: Diagnosis not present

## 2016-07-24 DIAGNOSIS — S93491A Sprain of other ligament of right ankle, initial encounter: Secondary | ICD-10-CM

## 2016-07-24 NOTE — Progress Notes (Signed)
OFFICE VISIT NOTE Tiffany [name]. Mayer Vondrak, [place] at [facility] [phone]  Tiffany [name] - 41 y.o. female MRN [id number]  Date of birth: 29-Dec-1975  Visit Date: 07/24/2016  PCP: Flossie Buffy, NP   Referred by: Flossie Buffy, NP  Burlene Arnt, CMA acting as scribe for Dr. Paulla Fore.  SUBJECTIVE:   Chief Complaint  Patient presents with  . Follow-up   HPI: As below and per problem based documentation when appropriate.  Pt presents today in follow-up of right ankle sprain. She reports continued tenderness. She is able to walk without the boot but it is still a little painful. She still has some swelling when she walks without the boot. She typically wears the boot when she leaves the house but takes it off while she is home. She has not been wearing the boot in bed. She is not currently doing PT/rehab.   Pt also following up on trigger ring finger of the right hand. She reports that they cyst that was seen at the ED had resolved after wearing a wrap so it wasn't seen at her last appointment but it has now come back. The cyst it painful and radiates into the hand and arm. She was told at the ED that it was attached to a ligament and that's what was causing the radiating pain. She is taking Vimovo once daily. She has some questions regarding the side effects of this medication.   Pt also following up on osteoarthritis of the right knee. She reports continued swelling in the right knee. She has not been using compression around the knee to control the swelling. She does have a knee brace but hasn't been wearing it while she has had the boot. She has occasional pain in the right knee but not as bad as it was before. She is taking Vimovo for her knee as well. She has also been icing the knee when it swells and gets relief with this.   Pt reports that her right sided low back pain and resolved.   Pt denies fever, chills, night sweats.       Review of Systems  Constitutional: Negative for chills and fever.  Respiratory: Negative for shortness of breath and wheezing.   Cardiovascular: Negative for chest pain and palpitations.  Musculoskeletal: Negative for back pain and falls.  Neurological: Negative for dizziness, tingling and headaches.  Endo/Heme/Allergies: Does not bruise/bleed easily.    Otherwise per HPI.  HISTORY & PERTINENT PRIOR DATA:  No specialty comments available. She reports that she has never smoked. She has never used smokeless tobacco. No results for input(s): HGBA1C, LABURIC in the last 8760 hours. Medications & Allergies reviewed per EMR Patient Active Problem List   Diagnosis Date Noted  . Ganglion cyst of flexor tendon sheath of finger of left hand 07/25/2016  . Trigger ring finger of right hand 07/05/2016  . High ankle sprain of right lower extremity 07/05/2016  . Primary osteoarthritis of right knee 05/11/2016  . Encounter for preventative adult health care exam with abnormal findings 12/23/2015  . Stuttering 12/23/2015  . Allergy to penicillin 10/08/2014  . Panic disorder without agoraphobia with severe panic attacks 08/05/2014  . Depression, major, recurrent, moderate ([place]) 07/02/2014    Class: Chronic  . Generalized anxiety disorder 07/02/2014    Class: Chronic  . Right sided abdominal pain 02/20/2014  . Urinary incontinence in female 02/20/2014  . Post-operative pain 01/28/2014  . Ventral hernia 05/09/2013  Past Medical History:  Diagnosis Date  . Anemia   . Anxiety   . Chicken pox 1981  . Depression   . GERD (gastroesophageal reflux disease)   . Hay fever 2015  . PTSD (post-traumatic stress disorder)   . Umbilical hernia    Family History  Problem Relation Age of Onset  . Heart disease Mother 66       heart attack  . Depression Mother   . Hyperlipidemia Mother   . Hypertension Father 48  . Drug abuse Father   . Depression Maternal Aunt   . Anxiety disorder Maternal Aunt    . Anxiety disorder Paternal Grandmother   . Arthritis Paternal Grandmother   . Depression Paternal Grandmother   . Depression Maternal Grandmother   . Depression Maternal Grandfather   . Depression Paternal Grandfather    Past Surgical History:  Procedure Laterality Date  . ABDOMINAL HYSTERECTOMY Bilateral 01/06/2014   Procedure: HYSTERECTOMY ABDOMINAL WITH BILATERAL SALPINGECTOMY ;  Surgeon: Delice Lesch, MD;  Location: [facility];  Service: Gynecology;  Laterality: Bilateral;  . ABDOMINAL HYSTERECTOMY  2016  . APPENDECTOMY     2002  . CESAREAN SECTION     2003  . LAPAROSCOPIC HYSTERECTOMY N/A 01/06/2014   Procedure: Attempted  TOTAL LAPAROSCOPIC Hysterectomy,;  Surgeon: Delice Lesch, MD;  Location: [facility];  Service: Gynecology;  Laterality: N/A;  . TRANSVERSE COLON RESECTION N/A 01/06/2014   Procedure: OVERSEW OF SEROSA TEAR OF THE TRANSVERSE COLON ;  Surgeon: Alphonsa Overall, MD;  Location: [facility];  Service: General;  Laterality: N/A;  . TUBAL LIGATION     2013   Social History   Occupational History  . Not on file.   Social History Main Topics  . Smoking status: Never Smoker  . Smokeless tobacco: Never Used  . Alcohol use No  . Drug use: No  . Sexual activity: Yes    OBJECTIVE:  VS:  HT:5\' 7"  (170.2 cm)   WT:249 lb (112.9 kg)  BMI:39.1    BP:104/76  HR:83bpm  TEMP: ( )  RESP:95 % EXAM: Findings:  Right ankle is overall well aligned.  She has a small amount of swelling pain with resisted plantarflexion and terminal dorsiflexion.  She is stable to anterior drawer and talar tilt but does have pain with both of these.  Pain is most focally TTP over anterior lateral ankle and anterior syndesmosis.  No pain with cotton testing.  Right knee overall well aligned.  Generalized osteoarthritic bossing.  Ligamentously stable.  No effusion.  Left hand: Well aligned.  She has a small synovial flexor tendon sheath cyst at the level of the PIP.  This is mobile and nontender.   There is no triggering.     Dg Ankle Complete Right  Result Date: 07/01/2016 CLINICAL DATA:  Right ankle pain after tripping into a hole. EXAM: RIGHT ANKLE - COMPLETE 3+ VIEW COMPARISON:  None. FINDINGS: There is no evidence of fracture, dislocation, or joint effusion. Small plantar calcaneal enthesophyte. Slight soft tissue swelling over the lateral malleolus. IMPRESSION: Soft tissue swelling without acute osseous abnormality of the right ankle. No dislocation. Electronically Signed   By: Ashley Royalty M.D.   On: 07/01/2016 21:55   Dg Foot Complete Right  Result Date: 07/05/2016 CLINICAL DATA:  Right foot pain following fall several days ago, initial encounter EXAM: RIGHT FOOT COMPLETE - 3+ VIEW COMPARISON:  None. FINDINGS: There is no evidence of fracture or dislocation. There is no evidence of arthropathy or  other focal bone abnormality. Soft tissues are unremarkable. IMPRESSION: No acute abnormality noted. Electronically Signed   By: Inez Catalina M.D.   On: 07/05/2016 13:19   ASSESSMENT & PLAN:   Problem List Items Addressed This Visit    Primary osteoarthritis of right knee    Was doing well prior to the fracture boot for her ankle but this does seem to have exacerbated her knee pain although no significant effusion.  VMO strengthening and hip abduction strengthening recommended and should continue with these with physical therapy.      Relevant Orders   Ambulatory referral to Physical Therapy   High ankle sprain of right lower extremity - Primary    She is ligamentously stable but does have pain over the syndesmosis still.  I like to see her try to wean out of fracture boot if possible given the persistent knee pain she has not transition back into the lace up ASO across like to have her begin on with formal physical therapy for advancing ankle program she is developing some stiffness but once again is ligamentously stable.  Okay to continue with the boot for doing his activities only but  once again like to minimize the baseline decrease the strain on her knee.      Relevant Orders   Ambulatory referral to Physical Therapy   Ganglion cyst of flexor tendon sheath of finger of left hand    Ganglion cyst is asymptomatic other than its presence.  Will continue watchful waiting.  Can continue with topical Pennsaid previously prescribed.         Follow-up: Return in about 6 weeks (around 09/04/2016).   CMA/ATC served as Education administrator during this visit. History, Physical, and Plan performed by medical provider. Documentation and orders reviewed and attested to.      Teresa Coombs, [facility] Physician

## 2016-07-25 DIAGNOSIS — M67442 Ganglion, left hand: Secondary | ICD-10-CM | POA: Insufficient documentation

## 2016-07-25 NOTE — Assessment & Plan Note (Signed)
She is ligamentously stable but does have pain over the syndesmosis still.  I like to see her try to wean out of fracture boot if possible given the persistent knee pain she has not transition back into the lace up ASO across like to have her begin on with formal physical therapy for advancing ankle program she is developing some stiffness but once again is ligamentously stable.  Okay to continue with the boot for doing his activities only but once again like to minimize the baseline decrease the strain on her knee.

## 2016-07-25 NOTE — Assessment & Plan Note (Signed)
Ganglion cyst is asymptomatic other than its presence.  Will continue watchful waiting.  Can continue with topical Pennsaid previously prescribed.

## 2016-07-25 NOTE — Assessment & Plan Note (Signed)
Was doing well prior to the fracture boot for her ankle but this does seem to have exacerbated her knee pain although no significant effusion.  VMO strengthening and hip abduction strengthening recommended and should continue with these with physical therapy.

## 2016-07-27 ENCOUNTER — Encounter: Payer: Self-pay | Admitting: Physical Therapy

## 2016-07-27 ENCOUNTER — Ambulatory Visit: Payer: Federal, State, Local not specified - PPO | Attending: Sports Medicine | Admitting: Physical Therapy

## 2016-07-27 DIAGNOSIS — M25561 Pain in right knee: Secondary | ICD-10-CM | POA: Diagnosis not present

## 2016-07-27 DIAGNOSIS — M6281 Muscle weakness (generalized): Secondary | ICD-10-CM | POA: Insufficient documentation

## 2016-07-27 DIAGNOSIS — M25571 Pain in right ankle and joints of right foot: Secondary | ICD-10-CM | POA: Diagnosis not present

## 2016-07-27 DIAGNOSIS — R262 Difficulty in walking, not elsewhere classified: Secondary | ICD-10-CM | POA: Diagnosis not present

## 2016-07-27 DIAGNOSIS — G8929 Other chronic pain: Secondary | ICD-10-CM | POA: Insufficient documentation

## 2016-07-27 NOTE — Therapy (Signed)
[place], [place], [address] Phone: [phone]   Fax:  [phone]  Physical Therapy Evaluation  Patient Details  Name: Tiffany [name] MRN: [id number] Date of Birth: [birth year] Referring Provider: Gerda Diss, DO  Encounter Date: 07/27/2016      PT End of Session - 07/27/16 1017    Visit Number 1   Number of Visits 13   Date for PT Re-Evaluation 09/08/16   Authorization Type [employer]   PT Start Time 1017   PT Stop Time 1120   PT Time Calculation (min) 63 min   Activity Tolerance Patient tolerated treatment well   Behavior During Therapy [facility] for tasks assessed/performed      Past Medical History:  Diagnosis Date  . Anemia   . Anxiety   . Chicken pox 1981  . Depression   . GERD (gastroesophageal reflux disease)   . Hay fever 2015  . PTSD (post-traumatic stress disorder)   . Umbilical hernia     Past Surgical History:  Procedure Laterality Date  . ABDOMINAL HYSTERECTOMY Bilateral 01/06/2014   Procedure: HYSTERECTOMY ABDOMINAL WITH BILATERAL SALPINGECTOMY ;  Surgeon: Delice Lesch, MD;  Location: [facility];  Service: Gynecology;  Laterality: Bilateral;  . ABDOMINAL HYSTERECTOMY  2016  . APPENDECTOMY     2002  . CESAREAN SECTION     2003  . LAPAROSCOPIC HYSTERECTOMY N/A 01/06/2014   Procedure: Attempted  TOTAL LAPAROSCOPIC Hysterectomy,;  Surgeon: Delice Lesch, MD;  Location: [facility];  Service: Gynecology;  Laterality: N/A;  . TRANSVERSE COLON RESECTION N/A 01/06/2014   Procedure: OVERSEW OF SEROSA TEAR OF THE TRANSVERSE COLON ;  Surgeon: Alphonsa Overall, MD;  Location: [facility];  Service: General;  Laterality: N/A;  . TUBAL LIGATION     2013    There were no vitals filed for this visit.       Subjective Assessment - 07/27/16 1023    Subjective Pt reports the knee is worse than the ankle now. Was exercising every day before injury. [place] a hole around uneven  concrete 6/9. Only wearing boot for long walks (grocery store etc). I am scared of PT because I heard it only makes it worse.    How long can you stand comfortably? 20 min without boot= agonizing pain   Patient Stated Goals drive, return to exercise   Currently in Pain? Yes   Pain Score 4   6/10 at worse in last 48 hr   Pain Location Ankle   Pain Orientation Right   Pain Descriptors / Indicators Sore;Aching;Throbbing   Aggravating Factors  walking   Pain Relieving Factors don boot, medications   Multiple Pain Sites Yes   Pain Score 6   Pain Location Knee   Pain Orientation Right   Pain Descriptors / Indicators --  always hurts   Aggravating Factors  constant pain   Pain Relieving Factors cortisone injection            [facility] PT Assessment - 07/27/16 0001      Assessment   Medical Diagnosis Right high ankle sprain, R knee OA   Referring Provider Gerda Diss, DO   Onset Date/Surgical Date 07/01/16   Hand Dominance Right   Next MD Visit 09/07/16   Prior Therapy no     Precautions   Precautions None     Restrictions   Other Position/Activity Restrictions WBAT     Balance Screen   Has  the patient fallen in the past 6 months Yes   How many times? 1   Has the patient had a decrease in activity level because of a fear of falling?  Yes   Is the patient reluctant to leave their home because of a fear of falling?  No     Home Ecologist residence   Living Arrangements Spouse/significant other;Children   Additional Comments stairs at home     Prior Function   Level of Independence Independent     Cognition   Overall Cognitive Status Within Functional Limits for tasks assessed     Observation/Other Assessments   Focus on Therapeutic Outcomes (FOTO)  68% limitation (goal 42%)     Sensation   Additional Comments WFL     ROM / Strength   AROM / PROM / Strength AROM;PROM;Strength     AROM   AROM Assessment Site Ankle   Right/Left  Ankle Right   Right Ankle Dorsiflexion 0   Right Ankle Plantar Flexion 30   Right Ankle Inversion 20   Right Ankle Eversion 6     PROM   PROM Assessment Site Ankle   Right/Left Ankle Right   Right Ankle Dorsiflexion 10   Right Ankle Plantar Flexion 50   Right Ankle Inversion 34   Right Ankle Eversion 10     Strength   Overall Strength Comments R hip abd 4-/5   Strength Assessment Site Ankle   Right/Left Ankle Right   Right Ankle Dorsiflexion 3-/5   Right Ankle Plantar Flexion 3-/5   Right Ankle Inversion 3-/5   Right Ankle Eversion 3-/5     Palpation   Palpation comment R medial ankle & gastroc soleus TTP     Ambulation/Gait   Gait Comments WFL            Objective measurements completed on examination: See above findings.          [facility] Adult PT Treatment/Exercise - 07/27/16 0001      Exercises   Exercises Ankle     Modalities   Modalities Electrical Stimulation;Cryotherapy     Cryotherapy   Number Minutes Cryotherapy 15 Minutes  concurrent with ESTIM   Cryotherapy Location Ankle;Knee   Type of Cryotherapy Ice pack     Electrical Stimulation   Electrical Stimulation Location ankle   Electrical Stimulation Action IFC   Electrical Stimulation Parameters 15 min to tolerance    Electrical Stimulation Goals Pain     Ankle Exercises: Clinical research associate Limitations long sitting gastroc +hamstring stretch with strap     Ankle Exercises: Supine   T-Band yellow tband ankle 4-way                PT Education - 07/27/16 1255    Education provided Yes   Education Details anatomy of condition, POC, HEP, exercise form/rationale, weening from boot/shoe wear, soreness from exercises v making pain worse   Person(s) Educated Patient   Methods Explanation;Demonstration;Tactile cues;Verbal cues;Handout   Comprehension Verbalized understanding;Returned demonstration;Verbal cues required;Tactile cues required;Need further instruction           PT Short Term Goals - 07/27/16 1302      PT SHORT TERM GOAL #1   Title Pt will demo at least 5 deg incr in ankle AROM by 7/27   Baseline see flowsheet   Time 3   Period Weeks   Status New     PT SHORT TERM GOAL #2   Title  pt will verbalize comfort in knowing when to use ASO for activities   Baseline began educating at eval   Time 3   Period Weeks   Status New           PT Long Term Goals - 07/27/16 1307      PT LONG TERM GOAL #1   Title Pt will be able to navigate stairs at home without limitation by ankle pain by 8/17   Baseline reports significant dislike for stairs   Time 6   Period Weeks   Status New     PT LONG TERM GOAL #2   Title ankle and hip MMT to 5/5 to indicate significant support to LE biomehcanical chain   Baseline hip abd 4-/5, ankle grossly 3-/5   Time 6   Period Weeks   Status New     PT LONG TERM GOAL #3   Title Pt will be able to return to walking program for exercise with understanding of appropraite stretching and protection of ankle   Baseline began educating at eval   Time 6   Period Weeks   Status New     PT LONG TERM GOAL #4   Title Pt will be able to drive without limitation from ankle pain   Baseline occasionally hurts so bad after a quick store run that her daughter has to drive   Time 6   Period Weeks   Status New                Plan - 07/27/16 1257    Clinical Impression Statement Pt presents to PT with complaints of R ankle and knee pain following a fall while walking about 1 month ago. Pt is nervous that exercises will aggrivate her knee and fear was addressed with education. Pt arrived wearing rubber flip flops without support or brace. Educated on progressive weening from boot to avoid excessive increases in pain. Pt with notable weakness and edema in R ankle as well as weakness in proximal stabilizing musculature. Pt will benefit from skilled PT to improve LE strength and stability to meet long term goals and return to  PLOF.    History and Personal Factors relevant to plan of care: depression, anxiety disorder, chronic R knee pain   Clinical Presentation Stable   Clinical Presentation due to: n/a   Clinical Decision Making Low   Rehab Potential Good   PT Frequency 2x / week   PT Duration 6 weeks   PT Treatment/Interventions ADLs/Self Care Home Management;Cryotherapy;Electrical Stimulation;Iontophoresis 4mg /ml Dexamethasone;Functional mobility training;Stair training;Gait training;Ultrasound;Traction;Moist Heat;Therapeutic activities;Therapeutic exercise;Balance training;Neuromuscular re-education;Patient/family education;Passive range of motion;Manual techniques;Dry needling;Taping;Vasopneumatic Device   PT Next Visit Plan soft tissue to gastroc, review ASO wear & how to don   PT Home Exercise Plan yellow tband ankle 4-way, long sitting DF stretch with strap, roller to calf, ice 2/day;    Consulted and Agree with Plan of Care Patient      Patient will benefit from skilled therapeutic intervention in order to improve the following deficits and impairments:  Decreased range of motion, Difficulty walking, Increased muscle spasms, Decreased activity tolerance, Pain, Improper body mechanics, Impaired flexibility, Decreased balance, Decreased mobility, Decreased strength, Increased edema  Visit Diagnosis: Pain in right ankle and joints of right foot - Plan: PT plan of care cert/re-cert  Chronic pain of right knee - Plan: PT plan of care cert/re-cert  Difficulty in walking, not elsewhere classified - Plan: PT plan of care cert/re-cert  Muscle weakness (generalized) -  Plan: PT plan of care cert/re-cert     Problem List Patient Active Problem List   Diagnosis Date Noted  . Ganglion cyst of flexor tendon sheath of finger of left hand 07/25/2016  . Trigger ring finger of right hand 07/05/2016  . High ankle sprain of right lower extremity 07/05/2016  . Primary osteoarthritis of right knee 05/11/2016  .  Encounter for preventative adult health care exam with abnormal findings 12/23/2015  . Stuttering 12/23/2015  . Allergy to penicillin 10/08/2014  . Panic disorder without agoraphobia with severe panic attacks 08/05/2014  . Depression, major, recurrent, moderate ([place]) 07/02/2014    Class: Chronic  . Generalized anxiety disorder 07/02/2014    Class: Chronic  . Right sided abdominal pain 02/20/2014  . Urinary incontinence in female 02/20/2014  . Post-operative pain 01/28/2014  . Ventral hernia 05/09/2013    Tiffany [name] C. Malaika Arnall PT, DPT 07/27/16 1:14 PM   [facility] [address] [place], [place], [address] Phone: [phone]   Fax:  [phone]  Name: Tiffany [name] MRN: [id number] Date of Birth: 1975-02-03

## 2016-08-03 DIAGNOSIS — F411 Generalized anxiety disorder: Secondary | ICD-10-CM | POA: Diagnosis not present

## 2016-08-09 ENCOUNTER — Encounter: Payer: Self-pay | Admitting: Physical Therapy

## 2016-08-09 ENCOUNTER — Ambulatory Visit: Payer: Federal, State, Local not specified - PPO | Admitting: Physical Therapy

## 2016-08-09 DIAGNOSIS — M25561 Pain in right knee: Secondary | ICD-10-CM | POA: Diagnosis not present

## 2016-08-09 DIAGNOSIS — M25571 Pain in right ankle and joints of right foot: Secondary | ICD-10-CM

## 2016-08-09 DIAGNOSIS — G8929 Other chronic pain: Secondary | ICD-10-CM | POA: Diagnosis not present

## 2016-08-09 DIAGNOSIS — M6281 Muscle weakness (generalized): Secondary | ICD-10-CM | POA: Diagnosis not present

## 2016-08-09 DIAGNOSIS — R262 Difficulty in walking, not elsewhere classified: Secondary | ICD-10-CM | POA: Diagnosis not present

## 2016-08-09 NOTE — Therapy (Signed)
[place], [place], [address] Phone: [phone]   Fax:  [phone]  Physical Therapy Treatment  Patient Details  Name: Tiffany [name] MRN: [id number] Date of Birth: Dec 25, 1975 Referring Provider: Gerda Diss, DO  Encounter Date: 08/09/2016      PT End of Session - 08/09/16 0801    Visit Number 2   Number of Visits 13   Date for PT Re-Evaluation 09/08/16   Authorization Type BCBS   PT Start Time 0801   PT Stop Time 3734   PT Time Calculation (min) 56 min   Activity Tolerance Patient tolerated treatment well   Behavior During Therapy [facility] for tasks assessed/performed      Past Medical History:  Diagnosis Date  . Anemia   . Anxiety   . Chicken pox 1981  . Depression   . GERD (gastroesophageal reflux disease)   . Hay fever 2015  . PTSD (post-traumatic stress disorder)   . Umbilical hernia     Past Surgical History:  Procedure Laterality Date  . ABDOMINAL HYSTERECTOMY Bilateral 01/06/2014   Procedure: HYSTERECTOMY ABDOMINAL WITH BILATERAL SALPINGECTOMY ;  Surgeon: Delice Lesch, MD;  Location: [facility];  Service: Gynecology;  Laterality: Bilateral;  . ABDOMINAL HYSTERECTOMY  2016  . APPENDECTOMY     2002  . CESAREAN SECTION     2003  . LAPAROSCOPIC HYSTERECTOMY N/A 01/06/2014   Procedure: Attempted  TOTAL LAPAROSCOPIC Hysterectomy,;  Surgeon: Delice Lesch, MD;  Location: [facility];  Service: Gynecology;  Laterality: N/A;  . TRANSVERSE COLON RESECTION N/A 01/06/2014   Procedure: OVERSEW OF SEROSA TEAR OF THE TRANSVERSE COLON ;  Surgeon: Alphonsa Overall, MD;  Location: [facility];  Service: General;  Laterality: N/A;  . TUBAL LIGATION     2013    There were no vitals filed for this visit.      Subjective Assessment - 08/09/16 0801    Subjective [place] and had a lot of pain today. Went to gym and walked on treadmill, leg press and upper body. Not wearing  boot any more.    Patient Stated Goals drive, return to exercise   Currently in Pain? Yes   Pain Score 5    Pain Location Ankle   Pain Orientation Right   Pain Descriptors / Indicators Sore   Aggravating Factors  walking                         [facility] Adult PT Treatment/Exercise - 08/09/16 0001      Exercises   Exercises Knee/Hip     Knee/Hip Exercises: Stretches   Active Hamstring Stretch Both;5 reps   Passive Hamstring Stretch Limitations seated EOB-short duration for education   Hip Flexor Stretch Limitations thomas test position   Other Knee/Hip Stretches figure 4 piriformis stretch     Cryotherapy   Number Minutes Cryotherapy 15 Minutes   Cryotherapy Location Knee;Ankle   Type of Cryotherapy Ice pack     Electrical Stimulation   Electrical Stimulation Location ankle   Electrical Stimulation Action IFC   Electrical Stimulation Parameters 15 min to tolerance   Electrical Stimulation Goals Pain     Ankle Exercises: Stretches   Soleus Stretch 30 seconds   Gastroc Stretch 30 seconds     Ankle Exercises: Seated   BAPS Level 2   Other Seated Ankle Exercises heel/toe raises-concentration on eccentric aspect  PT Education - 08/09/16 0810    Education provided Yes   Education Details bracing for knee and ankle, gym equipment use, balance stretch/strength, soreness assoc with prog walking   Person(s) Educated Patient   Methods Explanation;Demonstration;Tactile cues;Verbal cues;Handout   Comprehension Verbalized understanding;Returned demonstration;Verbal cues required;Tactile cues required;Need further instruction          PT Short Term Goals - 07/27/16 1302      PT SHORT TERM GOAL #1   Title Pt will demo at least 5 deg incr in ankle AROM by 7/27   Baseline see flowsheet   Time 3   Period Weeks   Status New     PT SHORT TERM GOAL #2   Title pt will verbalize comfort in knowing when to use ASO for activities   Baseline  began educating at eval   Time 3   Period Weeks   Status New           PT Long Term Goals - 07/27/16 1307      PT LONG TERM GOAL #1   Title Pt will be able to navigate stairs at home without limitation by ankle pain by 8/17   Baseline reports significant dislike for stairs   Time 6   Period Weeks   Status New     PT LONG TERM GOAL #2   Title ankle and hip MMT to 5/5 to indicate significant support to LE biomehcanical chain   Baseline hip abd 4-/5, ankle grossly 3-/5   Time 6   Period Weeks   Status New     PT LONG TERM GOAL #3   Title Pt will be able to return to walking program for exercise with understanding of appropraite stretching and protection of ankle   Baseline began educating at eval   Time 6   Period Weeks   Status New     PT LONG TERM GOAL #4   Title Pt will be able to drive without limitation from ankle pain   Baseline occasionally hurts so bad after a quick store run that her daughter has to drive   Time 6   Period Weeks   Status New               Plan - 08/09/16 0845    Clinical Impression Statement Good tolerance to exercise. Notable tightness at hip resulting in beg of sciatic nerve compression with radicular symtpoms. Discussed importance of balancing stretching with strengthening and soreness associated with progressing walking and shoe wear.    PT Treatment/Interventions ADLs/Self Care Home Management;Cryotherapy;Electrical Stimulation;Iontophoresis 4mg /ml Dexamethasone;Functional mobility training;Stair training;Gait training;Ultrasound;Traction;Moist Heat;Therapeutic activities;Therapeutic exercise;Balance training;Neuromuscular re-education;Patient/family education;Passive range of motion;Manual techniques;Dry needling;Taping;Vasopneumatic Device   PT Next Visit Plan review ASO wear if she brings it, foot intrinsic strength, hip stretch & strengthening   PT Home Exercise Plan yellow tband ankle 4-way, long sitting DF stretch with strap,  roller to calf, ice 2/day;    Consulted and Agree with Plan of Care Patient      Patient will benefit from skilled therapeutic intervention in order to improve the following deficits and impairments:  Decreased range of motion, Difficulty walking, Increased muscle spasms, Decreased activity tolerance, Pain, Improper body mechanics, Impaired flexibility, Decreased balance, Decreased mobility, Decreased strength, Increased edema  Visit Diagnosis: Pain in right ankle and joints of right foot  Chronic pain of right knee  Difficulty in walking, not elsewhere classified  Muscle weakness (generalized)     Problem List Patient Active Problem List  Diagnosis Date Noted  . Ganglion cyst of flexor tendon sheath of finger of left hand 07/25/2016  . Trigger ring finger of right hand 07/05/2016  . High ankle sprain of right lower extremity 07/05/2016  . Primary osteoarthritis of right knee 05/11/2016  . Encounter for preventative adult health care exam with abnormal findings 12/23/2015  . Stuttering 12/23/2015  . Allergy to penicillin 10/08/2014  . Panic disorder without agoraphobia with severe panic attacks 08/05/2014  . Depression, major, recurrent, moderate ([place]) 07/02/2014    Class: Chronic  . Generalized anxiety disorder 07/02/2014    Class: Chronic  . Right sided abdominal pain 02/20/2014  . Urinary incontinence in female 02/20/2014  . Post-operative pain 01/28/2014  . Ventral hernia 05/09/2013    Aymee Fomby C. Lylla Eifler PT, DPT 08/09/16 9:31 AM   [facility] [address] [place], [place], [address] Phone: [phone]   Fax:  [phone]  Name: Tiffany [name] MRN: [id number] Date of Birth: November 09, 1975

## 2016-08-10 ENCOUNTER — Ambulatory Visit: Payer: Federal, State, Local not specified - PPO | Admitting: Physical Therapy

## 2016-08-10 DIAGNOSIS — M6281 Muscle weakness (generalized): Secondary | ICD-10-CM

## 2016-08-10 DIAGNOSIS — G8929 Other chronic pain: Secondary | ICD-10-CM | POA: Diagnosis not present

## 2016-08-10 DIAGNOSIS — M25561 Pain in right knee: Secondary | ICD-10-CM | POA: Diagnosis not present

## 2016-08-10 DIAGNOSIS — R262 Difficulty in walking, not elsewhere classified: Secondary | ICD-10-CM | POA: Diagnosis not present

## 2016-08-10 DIAGNOSIS — M25571 Pain in right ankle and joints of right foot: Secondary | ICD-10-CM

## 2016-08-10 NOTE — Patient Instructions (Signed)
Hip Extension (Prone)   Lift left leg __10__ times per set. Do __1-2_ sets per session. Do _2___ sessions per day.  http://orth.exer.us/98   Copyright  VHI. All rights reserved.  Hip Adduction: Leg Lift (Eccentric) - Side-Lying   Lie on side with top leg bent, foot flat behind lower leg. Quickly lift lower leg. Slowly lower for 3-5 seconds. __10_ reps per set, _1-2__ sets per day, __7_ days per week.  Copyright  VHI. All rights reserved.  Abduction: Side Leg Lift (Eccentric) - Side-Lying   Lie on side. Lift top leg slightly higher than shoulder level. Keep top leg straight with body, toes pointing forward. Slowly lower for 3-5 seconds. __10_ reps per set, 1-2__ sets per day, _7__ days per week.  Strengthening: Straight Leg Raise (Phase 1)   Tighten muscles on front of right thigh, then lift leg ___12_ inches from surface, keeping knee locked.  Repeat _10___ times per set. Do __1-2__ sets per session. Do __2__ sessions per day.  http://orth.exer.us/614   Copyright  VHI. All rights reserved.

## 2016-08-10 NOTE — Therapy (Addendum)
[place], [place], [address] Phone: [phone]   Fax:  [phone]  Physical Therapy Treatment/Discharge Summary Patient Details  Name: Tiffany [name] MRN: [id number] Date of Birth: 05/30/1975 Referring Provider: Gerda Diss, DO  Encounter Date: 08/10/2016      PT End of Session - 08/10/16 0824    Visit Number 3   Number of Visits 13   Date for PT Re-Evaluation 09/08/16   Authorization Type BCBS   PT Start Time 0725   PT Stop Time 0803   PT Time Calculation (min) 38 min      Past Medical History:  Diagnosis Date  . Anemia   . Anxiety   . Chicken pox 1981  . Depression   . GERD (gastroesophageal reflux disease)   . Hay fever 2015  . PTSD (post-traumatic stress disorder)   . Umbilical hernia     Past Surgical History:  Procedure Laterality Date  . ABDOMINAL HYSTERECTOMY Bilateral 01/06/2014   Procedure: HYSTERECTOMY ABDOMINAL WITH BILATERAL SALPINGECTOMY ;  Surgeon: Delice Lesch, MD;  Location: [facility];  Service: Gynecology;  Laterality: Bilateral;  . ABDOMINAL HYSTERECTOMY  2016  . APPENDECTOMY     2002  . CESAREAN SECTION     2003  . LAPAROSCOPIC HYSTERECTOMY N/A 01/06/2014   Procedure: Attempted  TOTAL LAPAROSCOPIC Hysterectomy,;  Surgeon: Delice Lesch, MD;  Location: [facility];  Service: Gynecology;  Laterality: N/A;  . TRANSVERSE COLON RESECTION N/A 01/06/2014   Procedure: OVERSEW OF SEROSA TEAR OF THE TRANSVERSE COLON ;  Surgeon: Alphonsa Overall, MD;  Location: [facility];  Service: General;  Laterality: N/A;  . TUBAL LIGATION     2013    There were no vitals filed for this visit.      Subjective Assessment - 08/10/16 0728    Subjective I feel clicking in my knee when I sit and straighten it. Last night night ankle and knee were 7/10. I took inflammation meds and gel.    Currently in Pain? No/denies                         [facility] Adult PT Treatment/Exercise -  08/10/16 0001      Knee/Hip Exercises: Stretches   Active Hamstring Stretch 3 reps;20 seconds   Active Hamstring Stretch Limitations strap   Hip Flexor Stretch Limitations thomas test position   Other Knee/Hip Stretches figure 4 push and pull      Knee/Hip Exercises: Supine   Other Supine Knee/Hip Exercises 4 way hip bilateral x 10 reps each, needed rest to complete 10 reps all planes                 PT Education - 08/10/16 0824    Education provided Yes   Education Details HEP   Person(s) Educated Patient   Methods Explanation;Handout   Comprehension Verbalized understanding          PT Short Term Goals - 07/27/16 1302      PT SHORT TERM GOAL #1   Title Pt will demo at least 5 deg incr in ankle AROM by 7/27   Baseline see flowsheet   Time 3   Period Weeks   Status New     PT SHORT TERM GOAL #2   Title pt will verbalize comfort in knowing when to use ASO for activities   Baseline began educating at eval   Time 3   Period Weeks  Status New           PT Long Term Goals - 07/27/16 1307      PT LONG TERM GOAL #1   Title Pt will be able to navigate stairs at home without limitation by ankle pain by 8/17   Baseline reports significant dislike for stairs   Time 6   Period Weeks   Status New     PT LONG TERM GOAL #2   Title ankle and hip MMT to 5/5 to indicate significant support to LE biomehcanical chain   Baseline hip abd 4-/5, ankle grossly 3-/5   Time 6   Period Weeks   Status New     PT LONG TERM GOAL #3   Title Pt will be able to return to walking program for exercise with understanding of appropraite stretching and protection of ankle   Baseline began educating at eval   Time 6   Period Weeks   Status New     PT LONG TERM GOAL #4   Title Pt will be able to drive without limitation from ankle pain   Baseline occasionally hurts so bad after a quick store run that her daughter has to drive   Time 6   Period Weeks   Status New                Plan - 08/10/16 0825    Clinical Impression Statement No pain today. 7/10 pain yesterday evening. Began hip strengthening with pt requiring rest breaks to complete 10 reps of 4 way hip in all planes bilateral. Reviewed stretches from HEP. Updated HEP to include 4 way hip. No increased pain, only muscle burning.    PT Next Visit Plan review ASO wear if she brings it, foot intrinsic strength, hip stretch & strengthening   PT Home Exercise Plan yellow tband ankle 4-way, long sitting DF stretch with strap, roller to calf, ice 2/day; 4 way hip supine   Consulted and Agree with Plan of Care Patient      Patient will benefit from skilled therapeutic intervention in order to improve the following deficits and impairments:  Decreased range of motion, Difficulty walking, Increased muscle spasms, Decreased activity tolerance, Pain, Improper body mechanics, Impaired flexibility, Decreased balance, Decreased mobility, Decreased strength, Increased edema  Visit Diagnosis: Pain in right ankle and joints of right foot  Chronic pain of right knee  Difficulty in walking, not elsewhere classified  Muscle weakness (generalized)     Problem List Patient Active Problem List   Diagnosis Date Noted  . Ganglion cyst of flexor tendon sheath of finger of left hand 07/25/2016  . Trigger ring finger of right hand 07/05/2016  . High ankle sprain of right lower extremity 07/05/2016  . Primary osteoarthritis of right knee 05/11/2016  . Encounter for preventative adult health care exam with abnormal findings 12/23/2015  . Stuttering 12/23/2015  . Allergy to penicillin 10/08/2014  . Panic disorder without agoraphobia with severe panic attacks 08/05/2014  . Depression, major, recurrent, moderate ([place]) 07/02/2014    Class: Chronic  . Generalized anxiety disorder 07/02/2014    Class: Chronic  . Right sided abdominal pain 02/20/2014  . Urinary incontinence in female 02/20/2014  .  Post-operative pain 01/28/2014  . Ventral hernia 05/09/2013    Dorene Ar, PTA 08/10/2016, 8:28 AM  [facility] [address] [place], [place], [address] Phone: [phone]   Fax:  [phone]  Name: Tiffany [name] MRN: [id number] Date of Birth:  1975/08/04   PHYSICAL THERAPY DISCHARGE SUMMARY  Visits from Start of Care: 3  Current functional level related to goals / functional outcomes: See above   Remaining deficits: See above   Education / Equipment: Anatomy of condition, POC, HEP, exercise form/rationale  Plan: Patient agrees to discharge.  Patient goals were not met. Patient is being discharged due to not returning since the last visit.  ?????     Tiffany [name] PT, DPT 09/14/16 8:23 AM

## 2016-08-16 ENCOUNTER — Ambulatory Visit: Payer: Federal, State, Local not specified - PPO | Admitting: Physical Therapy

## 2016-08-16 ENCOUNTER — Telehealth: Payer: Self-pay | Admitting: Physical Therapy

## 2016-08-16 NOTE — Telephone Encounter (Signed)
Attempted to contact patient regarding no-show to appointment this morning. Voice mail was full, unable to leave message.

## 2016-08-18 ENCOUNTER — Ambulatory Visit: Payer: Federal, State, Local not specified - PPO | Admitting: Physical Therapy

## 2016-08-18 ENCOUNTER — Telehealth: Payer: Self-pay | Admitting: Physical Therapy

## 2016-08-18 NOTE — Telephone Encounter (Signed)
Attempted to contact pt regarding 2nd no show. Voicemail full, unable to leave message. Per policy, will leave next appointment and cancel all other scheduled appointments.  Linet Brash C. Graceanna Theissen PT, DPT 08/18/16 8:26 AM

## 2016-08-21 ENCOUNTER — Ambulatory Visit: Payer: Federal, State, Local not specified - PPO | Admitting: Physical Therapy

## 2016-08-24 ENCOUNTER — Encounter: Payer: Self-pay | Admitting: Physical Therapy

## 2016-08-28 ENCOUNTER — Encounter: Payer: Self-pay | Admitting: Physical Therapy

## 2016-08-30 ENCOUNTER — Encounter: Payer: Self-pay | Admitting: Physical Therapy

## 2016-08-31 DIAGNOSIS — L668 Other cicatricial alopecia: Secondary | ICD-10-CM | POA: Diagnosis not present

## 2016-08-31 DIAGNOSIS — L700 Acne vulgaris: Secondary | ICD-10-CM | POA: Diagnosis not present

## 2016-09-04 ENCOUNTER — Encounter: Payer: Self-pay | Admitting: Physical Therapy

## 2016-09-06 ENCOUNTER — Encounter: Payer: Self-pay | Admitting: Physical Therapy

## 2016-09-07 ENCOUNTER — Encounter: Payer: Self-pay | Admitting: Sports Medicine

## 2016-09-07 ENCOUNTER — Encounter: Payer: Self-pay | Admitting: Physical Therapy

## 2016-09-07 ENCOUNTER — Ambulatory Visit (INDEPENDENT_AMBULATORY_CARE_PROVIDER_SITE_OTHER): Payer: Federal, State, Local not specified - PPO

## 2016-09-07 ENCOUNTER — Ambulatory Visit (INDEPENDENT_AMBULATORY_CARE_PROVIDER_SITE_OTHER): Payer: Federal, State, Local not specified - PPO | Admitting: Sports Medicine

## 2016-09-07 VITALS — BP 104/88 | HR 88 | Ht 67.0 in | Wt 248.6 lb

## 2016-09-07 DIAGNOSIS — Z880 Allergy status to penicillin: Secondary | ICD-10-CM | POA: Diagnosis not present

## 2016-09-07 DIAGNOSIS — M5441 Lumbago with sciatica, right side: Secondary | ICD-10-CM | POA: Insufficient documentation

## 2016-09-07 DIAGNOSIS — F411 Generalized anxiety disorder: Secondary | ICD-10-CM

## 2016-09-07 DIAGNOSIS — M545 Low back pain: Secondary | ICD-10-CM | POA: Diagnosis not present

## 2016-09-07 DIAGNOSIS — M1711 Unilateral primary osteoarthritis, right knee: Secondary | ICD-10-CM | POA: Diagnosis not present

## 2016-09-07 MED ORDER — PREDNISONE 10 MG PO TABS: ORAL_TABLET | ORAL | 0 refills | Status: DC

## 2016-09-07 MED ORDER — GABAPENTIN 300 MG PO CAPS: ORAL_CAPSULE | ORAL | 1 refills | Status: DC

## 2016-09-07 NOTE — Patient Instructions (Signed)
Also check out the YouTube Video from Dr. Eric Goodman.  I would like to see you try performing this 5-6 days per week.    A good intro video is: "Independence from Pain 7-minute Video" - https://www.youtube.com/watch?v=[email or web address]   His more advanced video is: "Powerful Posture and Pain Relief: 12 minutes of Foundation Training" - https://youtu.be/4BOTvaRaDjI   Do not try to attempt this entire video when first beginning.    Try breaking of each exercise that he goes into shorter segments.  Otherwise if they perform an exercise for 45 seconds, start with 15 seconds and rest and then resume with a begin the new activity.  Work your way up to doing this 12 minute video and I expect to see significant improvements in your pain.  

## 2016-09-07 NOTE — Assessment & Plan Note (Signed)
Persistent pain.  No significant effusion today but she does have a generalized tenderness to the entire right leg and mainly over the anterior knee.  Will treat systemically for likely underlying radiculitis in the setting of mild to moderate degenerative change of the right knee.  The lack of improvement can consider further diagnostic imaging of the right knee if localizing symptoms

## 2016-09-07 NOTE — Assessment & Plan Note (Signed)
Symptoms may represent a right-sided radiculitis without focal symptoms.  Given the severity we will go ahead and treat systemically with steroid burst and gabapentinoids.  Core conditioning exercises discussed as well.  She did have a CT scan previously did show grade 1 anterior listhesis on L5-S1 while she is in supine position and suspect L5-S1 degenerative change.  If any lack of improvement further diagnostic evaluation with MRI indicated.

## 2016-09-07 NOTE — Assessment & Plan Note (Signed)
Gabapentin may be helpful, discussed potential side effects

## 2016-09-07 NOTE — Progress Notes (Signed)
OFFICE VISIT NOTE Tiffany [name], [place] at [facility] [phone]  Tiffany [name] - 41 y.o. female MRN [id number]  Date of birth: 04-04-1975  Visit Date: 09/07/2016  PCP: Tiffany Buffy, NP   Referred by: Tiffany Buffy, NP  Burlene Arnt, CMA acting as scribe for Dr. Paulla Fore.  SUBJECTIVE:   Chief Complaint  Patient presents with  . Follow-up    rt ankle sprain, rt knee pain   HPI: As below and per problem based documentation when appropriate.  Tiffany [name] is an established patient presenting today in follow-up of rt ankle sprain and osteoarthritis of the rt knee. She was last seen 07/24/16 and advised to transition from her boot to ASO ankle brace. For her knee pain she was given VMO strengthening exercises and hip abduction exercises. She was referred to PT.   Pt says that she went to PT for 3 weeks and then stopped going. She thought they were only going to be doing PT on her ankle but they ended up treating her for her hip and her too. She feels that her knee is worse since doing PT. She spoke with PT about tingling sensation in her toes and the back of the right leg. She does have lower back pain. PT also treated her for rt hip pain.  She is out of her boot and the ASO brace. She still feels a little pain in the ankle but feels that she is improving overall.   Her pain concern today is her right knee pain. She can feel popping in her knee while she is walking. She feels like when she stands there is a lot of pressure pushing her knee back. She feels like at times her knee is going to give out on her. She says that her knee still throbs a lot. She has been using ice packs and taking meds that were prescribed. She has been wearing her knee brace daily. She has been exercising more often. She has been doing cardio and upper body weight training. She will typically do the elliptical, treadmill or stationary bike rotating 10 mins on  each one. She doesn't have pain in the knee while doing these activities but has noticed that her knee feels stiff. She does have swelling in the knee after exercising.       Review of Systems  Constitutional: Negative for chills and fever.  Respiratory: Negative for shortness of breath and wheezing.   Cardiovascular: Positive for chest pain (d/t anxiety) and leg swelling. Negative for palpitations.  Musculoskeletal: Positive for joint pain. Negative for falls.  Neurological: Positive for tingling. Negative for dizziness and headaches.  Endo/Heme/Allergies: Does not bruise/bleed easily.  Psychiatric/Behavioral: The patient is nervous/anxious.     Otherwise per HPI.  HISTORY & PERTINENT PRIOR DATA:  No specialty comments available. She reports that she has never smoked. She has never used smokeless tobacco. No results for input(s): HGBA1C, LABURIC in the last 8760 hours. Medications & Allergies reviewed per EMR Patient Active Problem List   Diagnosis Date Noted  . Low back pain with right-sided sciatica 09/07/2016  . Ganglion cyst of flexor tendon sheath of finger of left hand 07/25/2016  . Trigger ring finger of right hand 07/05/2016  . High ankle sprain of right lower extremity 07/05/2016  . Primary osteoarthritis of right knee 05/11/2016  . Encounter for preventative adult health care exam with abnormal findings 12/23/2015  . Stuttering 12/23/2015  . Allergy  to penicillin 10/08/2014  . Panic disorder without agoraphobia with severe panic attacks 08/05/2014  . Depression, major, recurrent, moderate ([place]) 07/02/2014    Class: Chronic  . Generalized anxiety disorder 07/02/2014    Class: Chronic  . Right sided abdominal pain 02/20/2014  . Urinary incontinence in female 02/20/2014  . Post-operative pain 01/28/2014  . Ventral hernia 05/09/2013   Past Medical History:  Diagnosis Date  . Anemia   . Anxiety   . Chicken pox 1981  . Depression   . GERD (gastroesophageal reflux  disease)   . Hay fever 2015  . PTSD (post-traumatic stress disorder)   . Umbilical hernia    Family History  Problem Relation Age of Onset  . Heart disease Mother 3       heart attack  . Depression Mother   . Hyperlipidemia Mother   . Hypertension Father 39  . Drug abuse Father   . Depression Maternal Aunt   . Anxiety disorder Maternal Aunt   . Anxiety disorder Paternal Grandmother   . Arthritis Paternal Grandmother   . Depression Paternal Grandmother   . Depression Maternal Grandmother   . Depression Maternal Grandfather   . Depression Paternal Grandfather    Past Surgical History:  Procedure Laterality Date  . ABDOMINAL HYSTERECTOMY Bilateral 01/06/2014   Procedure: HYSTERECTOMY ABDOMINAL WITH BILATERAL SALPINGECTOMY ;  Surgeon: Delice Lesch, MD;  Location: [facility];  Service: Gynecology;  Laterality: Bilateral;  . ABDOMINAL HYSTERECTOMY  2016  . APPENDECTOMY     2002  . CESAREAN SECTION     2003  . LAPAROSCOPIC HYSTERECTOMY N/A 01/06/2014   Procedure: Attempted  TOTAL LAPAROSCOPIC Hysterectomy,;  Surgeon: Delice Lesch, MD;  Location: [facility];  Service: Gynecology;  Laterality: N/A;  . TRANSVERSE COLON RESECTION N/A 01/06/2014   Procedure: OVERSEW OF SEROSA TEAR OF THE TRANSVERSE COLON ;  Surgeon: Alphonsa Overall, MD;  Location: [facility];  Service: General;  Laterality: N/A;  . TUBAL LIGATION     2013   Social History   Occupational History  . Not on file.   Social History Main Topics  . Smoking status: Never Smoker  . Smokeless tobacco: Never Used  . Alcohol use No  . Drug use: No  . Sexual activity: Yes    OBJECTIVE:  VS:  HT:5\' 7"  (170.2 cm)   WT:248 lb 9.6 oz (112.8 kg)  BMI:39    BP:104/88  HR:88bpm  TEMP: ( )  RESP:97 % EXAM: Findings:  Adult female.  No acute distress.  Alert and appropriate.  Bilateral lower extremities overall well aligned quite large.  She does have 3-73mm of opening with valgus stressing bilaterally.  She has generalized pain  across the anterior aspect of the knee.  Without focality.  Only minimal symptoms with McMurray's.  No reproducible mechanical click.  Extensor mechanism intact. Straight leg raise is positive.  Good internal/external rotation of bilateral hips.  Lower extremity strength is intact manual muscle testing is 5+/5 in all myotomes.  Lower extremity sensation is intact light touch and symmetric.  She does have pain with popliteal compression test as well as greater sciatic notch tenderness.     Dg Lumbar Spine 2-3 Views  Result Date: 09/07/2016 CLINICAL DATA:  Lower back pain for 2 months, history of prior fall, initial encounter EXAM: LUMBAR SPINE - 3 VIEW COMPARISON:  None. FINDINGS: Five lumbar type vertebral bodies are well visualized. Vertebral body height is well maintained. No anterolisthesis is seen. No soft tissue abnormality is  noted. IMPRESSION: No acute abnormality seen. Electronically Signed   By: Inez Catalina M.D.   On: 09/07/2016 11:13   Dg Knee Ap/lat W/sunrise Right  Result Date: 09/07/2016 CLINICAL DATA:  Fall 2 months ago with persistent knee pain, initial encounter EXAM: RIGHT KNEE 3 VIEWS COMPARISON:  01/06/2016 FINDINGS: Medial joint space narrowing is noted with mild spurring. Mild patellofemoral spurring is noted as well. No joint effusion is seen. No acute fracture or dislocation is noted. IMPRESSION: Mild degenerative change without acute abnormality. Electronically Signed   By: Inez Catalina M.D.   On: 09/07/2016 11:13   ASSESSMENT & PLAN:     ICD-10-CM   1. Low back pain with right-sided sciatica, unspecified back pain laterality, unspecified chronicity M54.41 DG Lumbar Spine 2-3 Views    DG Knee AP/LAT W/Sunrise Right    predniSONE (DELTASONE) 10 MG tablet    gabapentin (NEURONTIN) 300 MG capsule  2. Generalized anxiety disorder F41.1   3. Primary osteoarthritis of right knee M17.11   4. Allergy to penicillin Z88.0     ================================================================= Primary osteoarthritis of right knee Persistent pain.  No significant effusion today but she does have a generalized tenderness to the entire right leg and mainly over the anterior knee.  Will treat systemically for likely underlying radiculitis in the setting of mild to moderate degenerative change of the right knee.  The lack of improvement can consider further diagnostic imaging of the right knee if localizing symptoms  Generalized anxiety disorder  Gabapentin may be helpful, discussed potential side effects  Low back pain with right-sided sciatica Symptoms may represent a right-sided radiculitis without focal symptoms.  Given the severity we will go ahead and treat systemically with steroid burst and gabapentinoids.  Core conditioning exercises discussed as well.  She did have a CT scan previously did show grade 1 anterior listhesis on L5-S1 while she is in supine position and suspect L5-S1 degenerative change.  If any lack of improvement further diagnostic evaluation with MRI indicated. =================================================================  Follow-up: Return in about 4 weeks (around 10/05/2016).   CMA/ATC served as Education administrator during this visit. History, Physical, and Plan performed by medical provider. Documentation and orders reviewed and attested to.      Teresa Coombs, [place] [facility] Physician

## 2016-09-08 ENCOUNTER — Encounter: Payer: Self-pay | Admitting: Physical Therapy

## 2016-09-14 DIAGNOSIS — F411 Generalized anxiety disorder: Secondary | ICD-10-CM | POA: Diagnosis not present

## 2016-10-05 ENCOUNTER — Encounter: Payer: Self-pay | Admitting: Sports Medicine

## 2016-10-05 ENCOUNTER — Ambulatory Visit (INDEPENDENT_AMBULATORY_CARE_PROVIDER_SITE_OTHER): Payer: Federal, State, Local not specified - PPO | Admitting: Sports Medicine

## 2016-10-05 VITALS — BP 112/76 | HR 76 | Ht 67.0 in | Wt 248.4 lb

## 2016-10-05 DIAGNOSIS — M5441 Lumbago with sciatica, right side: Secondary | ICD-10-CM

## 2016-10-05 DIAGNOSIS — M1711 Unilateral primary osteoarthritis, right knee: Secondary | ICD-10-CM

## 2016-10-05 DIAGNOSIS — F411 Generalized anxiety disorder: Secondary | ICD-10-CM

## 2016-10-05 DIAGNOSIS — R29898 Other symptoms and signs involving the musculoskeletal system: Secondary | ICD-10-CM | POA: Diagnosis not present

## 2016-10-05 NOTE — Progress Notes (Signed)
OFFICE VISIT NOTE Tiffany [name]. Tiffany [name], [place] at [facility] [phone]  Tiffany [name] - 41 y.o. female MRN [id number]  Date of birth: Jan 13, 1976  Visit Date: 10/05/2016  PCP: Flossie Buffy, NP   Referred by: Flossie Buffy, NP  Burlene Arnt, CMA acting as scribe for Dr. Paulla Fore.  SUBJECTIVE:   Chief Complaint  Patient presents with  . Follow-up    low back pain   HPI: As below and per problem based documentation when appropriate.  Tiffany [name] is an established patient presenting today in follow-up of low back pain with RT sided sciatica and RT knee pain.   Pt has completed steroid dose pack and is taking Gabapentin. She has noticing improvement in back since starting on these medications. She continues to have intermittent radiation of pain into the RT leg.   Pt reports continues RT knee pain. Pain is described as throbbing and seems to be worse when she is cold. She continues to have swelling in the knee. She has also been taking Vimovo for her knee pain. She does use compression while being active.        Review of Systems  Constitutional: Negative for chills and fever.  Respiratory: Negative for shortness of breath and wheezing.   Cardiovascular: Positive for palpitations and leg swelling. Negative for chest pain.  Gastrointestinal: Negative.   Genitourinary: Negative.   Musculoskeletal: Positive for back pain and joint pain. Negative for falls.  Neurological: Negative for dizziness, tingling and headaches.    Otherwise per HPI.  HISTORY & PERTINENT PRIOR DATA:  No specialty comments available. She reports that she has never smoked. She has never used smokeless tobacco. No results for input(s): HGBA1C, LABURIC in the last 8760 hours. Medications & Allergies reviewed per EMR Patient Active Problem List   Diagnosis Date Noted  . Proximal leg weakness 10/05/2016  . Low back pain with right-sided sciatica 09/07/2016  .  Ganglion cyst of flexor tendon sheath of finger of left hand 07/25/2016  . Trigger ring finger of right hand 07/05/2016  . High ankle sprain of right lower extremity 07/05/2016  . Primary osteoarthritis of right knee 05/11/2016  . Encounter for preventative adult health care exam with abnormal findings 12/23/2015  . Stuttering 12/23/2015  . Allergy to penicillin 10/08/2014  . Panic disorder without agoraphobia with severe panic attacks 08/05/2014  . Depression, major, recurrent, moderate ([place]) 07/02/2014    Class: Chronic  . Generalized anxiety disorder 07/02/2014    Class: Chronic  . Right sided abdominal pain 02/20/2014  . Urinary incontinence in female 02/20/2014  . Post-operative pain 01/28/2014  . Ventral hernia 05/09/2013   Past Medical History:  Diagnosis Date  . Anemia   . Anxiety   . Chicken pox 1981  . Depression   . GERD (gastroesophageal reflux disease)   . Hay fever 2015  . PTSD (post-traumatic stress disorder)   . Umbilical hernia    Family History  Problem Relation Age of Onset  . Heart disease Mother 5       heart attack  . Depression Mother   . Hyperlipidemia Mother   . Hypertension Father 85  . Drug abuse Father   . Depression Maternal Aunt   . Anxiety disorder Maternal Aunt   . Anxiety disorder Paternal Grandmother   . Arthritis Paternal Grandmother   . Depression Paternal Grandmother   . Depression Maternal Grandmother   . Depression Maternal Grandfather   . Depression Paternal  Grandfather    Past Surgical History:  Procedure Laterality Date  . ABDOMINAL HYSTERECTOMY Bilateral 01/06/2014   Procedure: HYSTERECTOMY ABDOMINAL WITH BILATERAL SALPINGECTOMY ;  Surgeon: Delice Lesch, MD;  Location: [facility];  Service: Gynecology;  Laterality: Bilateral;  . ABDOMINAL HYSTERECTOMY  2016  . APPENDECTOMY     2002  . CESAREAN SECTION     2003  . LAPAROSCOPIC HYSTERECTOMY N/A 01/06/2014   Procedure: Attempted  TOTAL LAPAROSCOPIC Hysterectomy,;   Surgeon: Delice Lesch, MD;  Location: [facility];  Service: Gynecology;  Laterality: N/A;  . TRANSVERSE COLON RESECTION N/A 01/06/2014   Procedure: OVERSEW OF SEROSA TEAR OF THE TRANSVERSE COLON ;  Surgeon: Alphonsa Overall, MD;  Location: [facility];  Service: General;  Laterality: N/A;  . TUBAL LIGATION     2013   Social History   Occupational History  . Not on file.   Social History Main Topics  . Smoking status: Never Smoker  . Smokeless tobacco: Never Used  . Alcohol use No  . Drug use: No  . Sexual activity: Yes    OBJECTIVE:  VS:  HT:5\' 7"  (170.2 cm)   WT:248 lb 6.4 oz (112.7 kg)  BMI:38.9    BP:112/76  HR:76bpm  TEMP: ( )  RESP:96 % EXAM: Findings:  Adult female.  No acute distress.  Alert and appropriate.  Bilateral lower extremities overall well aligned.  She does have tightness with straight leg raise but this is minimal.  Lower extremity sensation is intact light touch.  Lower extremity reflexes are 2+/4.  Her strength is intact to manual muscle testing as well as with heel and toe walking however she does have a slight amount of weakness with hip flexion and hip abduction exercises bilaterally.   No pain with palpation of her lumbar midline.  Right knee is overall well aligned.  No significant effusion.  She is generalized tenderness palpation.       No results found. ASSESSMENT & PLAN:     ICD-10-CM   1. Low back pain with right-sided sciatica, unspecified back pain laterality, unspecified chronicity M54.41   2. Generalized anxiety disorder F41.1   3. Primary osteoarthritis of right knee M17.11   4. Proximal leg weakness R29.898    ================================================================= Low back pain with right-sided sciatica Patient does not have any red flag symptoms and we will plan to have her be diligent with working on core conditioning program 6 days per week as well as lower extremity strengthening to see if this is beneficial.  We did offer  referral back to physical therapy but she would like to hold off at this time.  We will plan to have her continue taking anti-inflammatories on a daily basis for the next 2 weeks and plan to decrease to as needed.  A sample of Duexis provided today to see if this is more effective than Vimovo.  She will plan to call if she would like a prescription for this.  Additionally we will have her continue with her gabapentin at this time and plan to readdress whether this needs to be continued in 8 weeks.  She is having some benefit from an anxiety standpoint and I am happy to continue this if this seems to be helping this but will hope to get her off of this from a musculoskeletal standpoint in 8 weeks.   ================================================================= Patient Instructions  Also check out [employer]" which is a program developed by Dr. Minerva Ends.   There are links to  a couple of his YouTube Videos below and I would like to see performing one of his videos 5-6 days per week.    A good intro video is: "Independence from Pain 7-minute Video" - [email or web address]   His more advanced video is: "Powerful Posture and Pain Relief: 12 minutes of Foundation Training" - https://youtu.be/[email or web address]  Do not try to attempt this entire video when first beginning.    Try breaking of each exercise that he goes into shorter segments.  Otherwise if they perform an exercise for 45 seconds, start with 15 seconds and rest and then resume when they begin the new activity.    If you work your way up to doing this 12 minute video, I expect you will see significant improvements in your pain.  If you enjoy his videos and would like to find out more you can look on his website: [email or web address]  He has a workout streaming option as well as a DVD set available for purchase.  Amazon has the best price for his DVDs.      =================================================================  Follow-up: Return in about 8 weeks (around 11/30/2016).   CMA/ATC served as Education administrator during this visit. History, Physical, and Plan performed by medical provider. Documentation and orders reviewed and attested to.      Teresa Coombs, [place] [facility] Physician

## 2016-10-05 NOTE — Assessment & Plan Note (Signed)
Patient does not have any red flag symptoms and we will plan to have her be diligent with working on core conditioning program 6 days per week as well as lower extremity strengthening to see if this is beneficial.  We did offer referral back to physical therapy but she would like to hold off at this time.  We will plan to have her continue taking anti-inflammatories on a daily basis for the next 2 weeks and plan to decrease to as needed.  A sample of Duexis provided today to see if this is more effective than Vimovo.  She will plan to call if she would like a prescription for this.  Additionally we will have her continue with her gabapentin at this time and plan to readdress whether this needs to be continued in 8 weeks.  She is having some benefit from an anxiety standpoint and I am happy to continue this if this seems to be helping this but will hope to get her off of this from a musculoskeletal standpoint in 8 weeks.

## 2016-10-05 NOTE — Patient Instructions (Signed)

## 2016-10-19 DIAGNOSIS — F411 Generalized anxiety disorder: Secondary | ICD-10-CM | POA: Diagnosis not present

## 2016-10-24 ENCOUNTER — Other Ambulatory Visit (INDEPENDENT_AMBULATORY_CARE_PROVIDER_SITE_OTHER): Payer: Federal, State, Local not specified - PPO

## 2016-10-24 ENCOUNTER — Ambulatory Visit (INDEPENDENT_AMBULATORY_CARE_PROVIDER_SITE_OTHER): Payer: Federal, State, Local not specified - PPO | Admitting: Nurse Practitioner

## 2016-10-24 ENCOUNTER — Encounter: Payer: Self-pay | Admitting: Nurse Practitioner

## 2016-10-24 VITALS — BP 116/80 | HR 84 | Temp 98.5°F | Ht 67.0 in | Wt 248.0 lb

## 2016-10-24 DIAGNOSIS — R197 Diarrhea, unspecified: Secondary | ICD-10-CM | POA: Diagnosis not present

## 2016-10-24 DIAGNOSIS — F410 Panic disorder [episodic paroxysmal anxiety] without agoraphobia: Secondary | ICD-10-CM | POA: Diagnosis not present

## 2016-10-24 DIAGNOSIS — K529 Noninfective gastroenteritis and colitis, unspecified: Secondary | ICD-10-CM | POA: Diagnosis not present

## 2016-10-24 DIAGNOSIS — R252 Cramp and spasm: Secondary | ICD-10-CM

## 2016-10-24 DIAGNOSIS — F331 Major depressive disorder, recurrent, moderate: Secondary | ICD-10-CM | POA: Diagnosis not present

## 2016-10-24 LAB — CBC
HEMATOCRIT: 38.6 % (ref 36.0–46.0)
HEMOGLOBIN: 12.1 g/dL (ref 12.0–15.0)
MCHC: 31.2 g/dL (ref 30.0–36.0)
MCV: 79.6 fl (ref 78.0–100.0)
Platelets: 333 10*3/uL (ref 150.0–400.0)
RBC: 4.85 Mil/uL (ref 3.87–5.11)
RDW: 16.2 % — ABNORMAL HIGH (ref 11.5–15.5)
WBC: 6.9 10*3/uL (ref 4.0–10.5)

## 2016-10-24 LAB — URINALYSIS WITH CULTURE, IF INDICATED
Bilirubin Urine: NEGATIVE
Hgb urine dipstick: NEGATIVE
Ketones, ur: NEGATIVE
Leukocytes, UA: NEGATIVE
NITRITE: NEGATIVE
RBC / HPF: NONE SEEN (ref 0–?)
SPECIFIC GRAVITY, URINE: 1.015 (ref 1.000–1.030)
Total Protein, Urine: NEGATIVE
Urine Glucose: NEGATIVE
Urobilinogen, UA: 0.2 (ref 0.0–1.0)
WBC UA: NONE SEEN (ref 0–?)
pH: 6 (ref 5.0–8.0)

## 2016-10-24 LAB — BASIC METABOLIC PANEL
BUN: 7 mg/dL (ref 6–23)
CHLORIDE: 105 meq/L (ref 96–112)
CO2: 31 meq/L (ref 19–32)
CREATININE: 0.82 mg/dL (ref 0.40–1.20)
Calcium: 8.9 mg/dL (ref 8.4–10.5)
GFR: 98.17 mL/min (ref >60.00)
GLUCOSE: 90 mg/dL (ref 70–99)
Potassium: 3.5 mEq/L (ref 3.5–5.1)
Sodium: 140 mEq/L (ref 135–145)

## 2016-10-24 LAB — MAGNESIUM: Magnesium: 2.2 mg/dL (ref 1.5–2.5)

## 2016-10-24 MED ORDER — ONDANSETRON HCL 4 MG PO TABS: 4.0000 mg | ORAL_TABLET | Freq: Three times a day (TID) | ORAL | 0 refills | Status: AC | PRN

## 2016-10-24 NOTE — Patient Instructions (Addendum)
Normal urinalysis, renal function, electrolytes and cbc. Will ref to GI if symptoms do not improve in 1-2weeks  Encourage adequate oral hydration. Start with clear liquid diet x 24hrs then advance to bland diet as tolerated.  Call office if no improvement in 1week.   Maintain appt with current psychiatry till you get appt with [facility]. Please inform current psychiatry about side effects of trazodone.  Bland Diet A bland diet consists of foods that do not have a lot of fat or fiber. Foods without fat or fiber are easier for the body to digest. They are also less likely to irritate your mouth, throat, stomach, and other parts of your gastrointestinal tract. A bland diet is sometimes called a BRAT diet. What is my plan? Your health care provider or dietitian may recommend specific changes to your diet to prevent and treat your symptoms, such as:  Eating small meals often.  Cooking food until it is soft enough to chew easily.  Chewing your food well.  Drinking fluids slowly.  Not eating foods that are very spicy, sour, or fatty.  Not eating citrus fruits, such as oranges and grapefruit.  What do I need to know about this diet?  Eat a variety of foods from the bland diet food list.  Do not follow a bland diet longer than you have to.  Ask your health care provider whether you should take vitamins. What foods can I eat? Grains  Hot cereals, such as cream of wheat. Bread, crackers, or tortillas made from refined white flour. Rice. Vegetables Canned or cooked vegetables. Mashed or boiled potatoes. Fruits Bananas. Applesauce. Other types of cooked or canned fruit with the skin and seeds removed, such as canned peaches or pears. Meats and Other Protein Sources Scrambled eggs. Creamy peanut butter or other nut butters. Lean, well-cooked meats, such as chicken or fish. Tofu. Soups or broths. Dairy Low-fat dairy products, such as milk, cottage cheese, or  yogurt. Beverages Water. Herbal tea. Apple juice. Sweets and Desserts Pudding. Custard. Fruit gelatin. Ice cream. Fats and Oils Mild salad dressings. Canola or olive oil. The items listed above may not be a complete list of allowed foods or beverages. Contact your dietitian for more options. What foods are not recommended? Foods and ingredients that are often not recommended include:  Spicy foods, such as hot sauce or salsa.  Fried foods.  Sour foods, such as pickled or fermented foods.  Raw vegetables or fruits, especially citrus or berries.  Caffeinated drinks.  Alcohol.  Strongly flavored seasonings or condiments.  The items listed above may not be a complete list of foods and beverages that are not allowed. Contact your dietitian for more information. This information is not intended to replace advice given to you by your health care provider. Make sure you discuss any questions you have with your health care provider. Document Released: 05/03/2015 Document Revised: 06/17/2015 Document Reviewed: 01/21/2014 Elsevier Interactive Patient Education  2018 [employer].

## 2016-10-24 NOTE — Progress Notes (Signed)
Subjective:  Patient ID: Tiffany [name], female    DOB: 1975/03/17  Age: 41 y.o. MRN: [id number]  CC: GI Problem (stomach issue,vomit, diarrhea,headache,tight muscle,back pain going on for 2 days/ not sure if she took laxative twice last night?)  GI Problem  The primary symptoms include abdominal pain, nausea, vomiting, diarrhea and myalgias. Primary symptoms do not include fever, fatigue, melena, hematemesis, jaundice, hematochezia or dysuria. The illness began yesterday. The onset was sudden. The problem has not changed since onset. The illness is also significant for anorexia and bloating. Significant associated medical issues include GERD and irritable bowel syndrome. Risk factors: onset after eating at restaurant.  had similar GI symptoms 04/2016. Stool sample never collected.  Anxiety and depression: Low energy, racing thoughts, insomnia Uncontrolled with wellbutrin, Buspar, klonopin and trazodone. Prozac was also prescribed, but she has not started due to fear of possible side effects. Managed by [facility]. She reports impaired memory with use of trazodone. This leads to taking medication twice when not sure if she already took dose. Has happened 2times in past. She has not informed psychiatry about the incidence. Denies any SI or HI. She will like referral t another psychiatry.  Outpatient Medications Prior to Visit  Medication Sig Dispense Refill  . buPROPion (WELLBUTRIN XL) 150 MG 24 hr tablet Take 150 mg by mouth daily.    . busPIRone (BUSPAR) 15 MG tablet Take 15 mg by mouth 3 (three) times daily.    . clobetasol ointment (TEMOVATE) 0.62 % Apply 1 application topically daily.     . cyclobenzaprine (FLEXERIL) 10 MG tablet Take 1 tablet (10 mg total) by mouth 3 (three) times daily as needed for muscle spasms. 15 tablet 0  . mometasone (NASONEX) 50 MCG/ACT nasal spray Place 2 sprays into the nose daily. 17 g 0  . Multiple Vitamin (MULTIVITAMIN) capsule Take 1 capsule  by mouth daily.    . Naproxen-Esomeprazole (VIMOVO) 500-20 MG TBEC Take 1 tablet by mouth 2 (two) times daily. 60 tablet 2  . traZODone (DESYREL) 50 MG tablet Take 1 tablet by mouth at bedtime.    . gabapentin (NEURONTIN) 300 MG capsule Start with 1 tab po qhs X 1 week, then increase to 1 tab po bid X 1 week then 1 tab po tid prn (Patient not taking: Reported on 10/24/2016) 90 capsule 1  . naproxen (NAPROSYN) 500 MG tablet Take 1 tablet (500 mg total) by mouth 2 (two) times daily. (Patient not taking: Reported on 10/24/2016) 30 tablet 0  . naproxen (NAPROSYN) 500 MG tablet Take 1 tablet (500 mg total) by mouth 2 (two) times daily as needed for mild pain, moderate pain or headache (TAKE WITH MEALS.). (Patient not taking: Reported on 10/24/2016) 20 tablet 0   No facility-administered medications prior to visit.     ROS See HPI  Objective:  BP 116/80   Pulse 84   Temp 98.5 F (36.9 C)   Ht 5\' 7"  (1.702 m)   Wt 248 lb (112.5 kg)   LMP 10/24/2013   SpO2 99%   BMI 38.84 kg/m   BP Readings from Last 3 Encounters:  10/24/16 116/80  10/05/16 112/76  09/07/16 104/88    Wt Readings from Last 3 Encounters:  10/24/16 248 lb (112.5 kg)  10/05/16 248 lb 6.4 oz (112.7 kg)  09/07/16 248 lb 9.6 oz (112.8 kg)    Physical Exam  Constitutional: She is oriented to person, place, and time. No distress.  Cardiovascular: Normal rate.  Pulmonary/Chest: Effort normal.  Abdominal: Soft. Bowel sounds are normal. She exhibits no distension. There is tenderness. There is no rebound and no guarding.  Suprapubic tenderness,  no CVA tenderness  Musculoskeletal: Normal range of motion.  Neurological: She is alert and oriented to person, place, and time.  Skin: Skin is warm and dry.  Vitals reviewed.   Lab Results  Component Value Date   WBC 6.9 10/24/2016   HGB 12.1 10/24/2016   HCT 38.6 10/24/2016   PLT 333.0 10/24/2016   GLUCOSE 90 10/24/2016   CHOL 211 (H) 12/23/2015   TRIG 131.0 12/23/2015     HDL 40.40 12/23/2015   LDLCALC 145 (H) 12/23/2015   ALT 17 05/07/2016   AST 19 05/07/2016   NA 140 10/24/2016   K 3.5 10/24/2016   CL 105 10/24/2016   CREATININE 0.82 10/24/2016   BUN 7 10/24/2016   CO2 31 10/24/2016   TSH 1.76 12/23/2015    Dg Ankle Complete Right  Result Date: 07/01/2016 CLINICAL DATA:  Right ankle pain after tripping into a hole. EXAM: RIGHT ANKLE - COMPLETE 3+ VIEW COMPARISON:  None. FINDINGS: There is no evidence of fracture, dislocation, or joint effusion. Small plantar calcaneal enthesophyte. Slight soft tissue swelling over the lateral malleolus. IMPRESSION: Soft tissue swelling without acute osseous abnormality of the right ankle. No dislocation. Electronically Signed   By: Ashley Royalty M.D.   On: 07/01/2016 21:55    Assessment & Plan:   Tiffany [name] was seen today for gi problem.  Diagnoses and all orders for this visit:  Gastroenteritis -     Basic metabolic panel; Future -     Magnesium; Future -     CBC; Future -     Urinalysis with Culture, if indicated; Future -     ondansetron (ZOFRAN) 4 MG tablet; Take 1 tablet (4 mg total) by mouth every 8 (eight) hours as needed for nausea or vomiting.  Muscle cramps -     Basic metabolic panel; Future -     Magnesium; Future -     CBC; Future  Depression, major, recurrent, moderate ([place]) -     Ambulatory referral to Psychiatry  Panic disorder without agoraphobia with severe panic attacks -     Ambulatory referral to Psychiatry  Diarrhea, unspecified type   I have discontinued Tiffany [name]'s naproxen and naproxen. I am also having her start on ondansetron. Additionally, I am having her maintain her multivitamin, clobetasol ointment, traZODone, mometasone, buPROPion, busPIRone, cyclobenzaprine, Naproxen-Esomeprazole, and gabapentin.  Meds ordered this encounter  Medications  . ondansetron (ZOFRAN) 4 MG tablet    Sig: Take 1 tablet (4 mg total) by mouth every 8 (eight) hours as needed for nausea or  vomiting.    Dispense:  20 tablet    Refill:  0    Order Specific Question:   Supervising Provider    Answer:   Cassandria Anger [[id number]]    Follow-up: Return in about 4 weeks (around 11/21/2016) for CPE (fasting) at [facility].  [name], NP

## 2016-10-25 ENCOUNTER — Telehealth: Payer: Self-pay | Admitting: Sports Medicine

## 2016-10-25 ENCOUNTER — Other Ambulatory Visit: Payer: Self-pay

## 2016-10-25 MED ORDER — IBUPROFEN-FAMOTIDINE 800-26.6 MG PO TABS: 1.0000 | ORAL_TABLET | Freq: Three times a day (TID) | ORAL | 2 refills | Status: AC | PRN

## 2016-10-25 NOTE — Telephone Encounter (Signed)
Patient wants to order the medication that she received at her last visit,. She was told that she needed to go through a specialty pharmacy. Please call.

## 2016-10-25 NOTE — Telephone Encounter (Signed)
Rx for Duexis sent to [facility].

## 2016-10-26 NOTE — Telephone Encounter (Signed)
Called pt and advised.  

## 2016-11-07 ENCOUNTER — Emergency Department (HOSPITAL_COMMUNITY)
Admission: EM | Admit: 2016-11-07 | Discharge: 2016-11-07 | Disposition: A | Discharge status: Home / Self Care | Payer: Federal, State, Local not specified - PPO | Attending: Emergency Medicine | Admitting: Emergency Medicine

## 2016-11-07 ENCOUNTER — Emergency Department (HOSPITAL_COMMUNITY): Payer: Federal, State, Local not specified - PPO

## 2016-11-07 ENCOUNTER — Encounter (HOSPITAL_COMMUNITY): Payer: Self-pay

## 2016-11-07 DIAGNOSIS — Z79899 Other long term (current) drug therapy: Secondary | ICD-10-CM | POA: Diagnosis not present

## 2016-11-07 DIAGNOSIS — R350 Frequency of micturition: Secondary | ICD-10-CM | POA: Insufficient documentation

## 2016-11-07 DIAGNOSIS — R300 Dysuria: Secondary | ICD-10-CM | POA: Diagnosis not present

## 2016-11-07 DIAGNOSIS — R109 Unspecified abdominal pain: Secondary | ICD-10-CM | POA: Diagnosis not present

## 2016-11-07 DIAGNOSIS — R110 Nausea: Secondary | ICD-10-CM | POA: Diagnosis not present

## 2016-11-07 DIAGNOSIS — R197 Diarrhea, unspecified: Secondary | ICD-10-CM | POA: Diagnosis not present

## 2016-11-07 LAB — CBC
HCT: 39 % (ref 36.0–46.0)
HEMOGLOBIN: 12.2 g/dL (ref 12.0–15.0)
MCH: 24.5 pg — AB (ref 26.0–34.0)
MCHC: 31.3 g/dL (ref 30.0–36.0)
MCV: 78.5 fL (ref 78.0–100.0)
Platelets: 339 10*3/uL (ref 150–400)
RBC: 4.97 MIL/uL (ref 3.87–5.11)
RDW: 15.9 % — ABNORMAL HIGH (ref 11.5–15.5)
WBC: 6.9 10*3/uL (ref 4.0–10.5)

## 2016-11-07 LAB — URINALYSIS, ROUTINE W REFLEX MICROSCOPIC
Bilirubin Urine: NEGATIVE
Glucose, UA: NEGATIVE mg/dL
KETONES UR: NEGATIVE mg/dL
Nitrite: NEGATIVE
PROTEIN: NEGATIVE mg/dL
Specific Gravity, Urine: 1.017 (ref 1.005–1.030)
pH: 5 (ref 5.0–8.0)

## 2016-11-07 LAB — COMPREHENSIVE METABOLIC PANEL
ALBUMIN: 3.5 g/dL (ref 3.5–5.0)
ALK PHOS: 65 U/L (ref 38–126)
ALT: 12 U/L — ABNORMAL LOW (ref 14–54)
ANION GAP: 7 (ref 5–15)
AST: 14 U/L — ABNORMAL LOW (ref 15–41)
BUN: <5 mg/dL — ABNORMAL LOW (ref 6–20)
CALCIUM: 8.9 mg/dL (ref 8.9–10.3)
CHLORIDE: 106 mmol/L (ref 101–111)
CO2: 25 mmol/L (ref 22–32)
CREATININE: 0.9 mg/dL (ref 0.44–1.00)
GFR calc non Af Amer: >60 mL/min (ref >60)
GLUCOSE: 100 mg/dL — AB (ref 65–99)
Potassium: 3.5 mmol/L (ref 3.5–5.1)
SODIUM: 138 mmol/L (ref 135–145)
Total Bilirubin: 0.5 mg/dL (ref 0.3–1.2)
Total Protein: 6.9 g/dL (ref 6.5–8.1)

## 2016-11-07 LAB — I-STAT BETA HCG BLOOD, ED (MC, WL, AP ONLY)

## 2016-11-07 LAB — LIPASE, BLOOD: LIPASE: 19 U/L (ref 11–51)

## 2016-11-07 MED ORDER — HYDROCODONE-ACETAMINOPHEN 5-325 MG PO TABS
1.0000 | ORAL_TABLET | Freq: Four times a day (QID) | ORAL | 0 refills | Status: DC | PRN
Start: 2016-11-07 — End: 2016-11-21

## 2016-11-07 MED ORDER — MORPHINE SULFATE (PF) 4 MG/ML IV SOLN
4.0000 mg | Freq: Once | INTRAVENOUS | Status: AC
  Administered 2016-11-07: 4 mg via INTRAVENOUS
  Filled 2016-11-07: qty 1

## 2016-11-07 MED ORDER — ONDANSETRON HCL 4 MG/2ML IJ SOLN
4.0000 mg | Freq: Once | INTRAMUSCULAR | Status: AC
  Administered 2016-11-07: 4 mg via INTRAVENOUS
  Filled 2016-11-07: qty 2

## 2016-11-07 MED ORDER — SULFAMETHOXAZOLE-TRIMETHOPRIM 800-160 MG PO TABS: 1.0000 | ORAL_TABLET | Freq: Two times a day (BID) | ORAL | 0 refills | Status: AC

## 2016-11-07 NOTE — ED Provider Notes (Signed)
[facility] EMERGENCY DEPARTMENT Provider Note   CSN: [id number] Arrival date & time: 11/07/16  9323     History   Chief Complaint No chief complaint on file.   HPI Tiffany [name] is a 41 y.o. female with past medial history of GERD, PTSD, depression, anxiety, anemia, stuttering, presenting to the ED with acute onset of worsening right flank pain that radiates to her abdomen and suprapubic area x 2 days. Patient states pain is sharp and intermittent, 7/10 severity. She states she takes naproxen daily for arthritis, which did not provide her with relief. Reports associated nausea, burning with urination, increased urinary frequency, and episode of diarrhea yesterday. She denies vaginal bleeding or discharge, fever/chills, history of nephrolithiasis, or other complaints. Abdominal surgeries include hysterectomy (b hcg done in triage?), appendectomy, partial colon resection.  The history is provided by the patient.    Past Medical History:  Diagnosis Date  . Anemia   . Anxiety   . Chicken pox 1981  . Depression   . GERD (gastroesophageal reflux disease)   . Hay fever 2015  . PTSD (post-traumatic stress disorder)   . Umbilical hernia     Patient Active Problem List   Diagnosis Date Noted  . Proximal leg weakness 10/05/2016  . Low back pain with right-sided sciatica 09/07/2016  . Ganglion cyst of flexor tendon sheath of finger of left hand 07/25/2016  . Trigger ring finger of right hand 07/05/2016  . High ankle sprain of right lower extremity 07/05/2016  . Primary osteoarthritis of right knee 05/11/2016  . Encounter for preventative adult health care exam with abnormal findings 12/23/2015  . Stuttering 12/23/2015  . Allergy to penicillin 10/08/2014  . Panic disorder without agoraphobia with severe panic attacks 08/05/2014  . Depression, major, recurrent, moderate ([place]) 07/02/2014    Class: Chronic  . Generalized anxiety disorder 07/02/2014    Class: Chronic   . Right sided abdominal pain 02/20/2014  . Urinary incontinence in female 02/20/2014  . Post-operative pain 01/28/2014  . Ventral hernia 05/09/2013    Past Surgical History:  Procedure Laterality Date  . ABDOMINAL HYSTERECTOMY Bilateral 01/06/2014   Procedure: HYSTERECTOMY ABDOMINAL WITH BILATERAL SALPINGECTOMY ;  Surgeon: Delice Lesch, MD;  Location: [facility];  Service: Gynecology;  Laterality: Bilateral;  . ABDOMINAL HYSTERECTOMY  2016  . APPENDECTOMY     2002  . CESAREAN SECTION     2003  . LAPAROSCOPIC HYSTERECTOMY N/A 01/06/2014   Procedure: Attempted  TOTAL LAPAROSCOPIC Hysterectomy,;  Surgeon: Delice Lesch, MD;  Location: [facility];  Service: Gynecology;  Laterality: N/A;  . TRANSVERSE COLON RESECTION N/A 01/06/2014   Procedure: OVERSEW OF SEROSA TEAR OF THE TRANSVERSE COLON ;  Surgeon: Alphonsa Overall, MD;  Location: [facility];  Service: General;  Laterality: N/A;  . TUBAL LIGATION     2013    OB History    Gravida Para Term Preterm AB Living   4 3 3   1 7    SAB TAB Ectopic Multiple Live Births   1       3       Home Medications    Prior to Admission medications   Medication Sig Start Date End Date Taking? Authorizing Provider  buPROPion (WELLBUTRIN XL) 150 MG 24 hr tablet Take 150 mg by mouth daily.   Yes [provider]  busPIRone (BUSPAR) 15 MG tablet Take 15 mg by mouth 3 (three) times daily.   Yes [provider]  clobetasol ointment (TEMOVATE)  9.02 % Apply 1 application topically daily.    Yes [provider]  clonazePAM (KLONOPIN) 1 MG tablet Take 1 mg by mouth daily as needed for anxiety.  02/17/15  Yes [provider]  cyclobenzaprine (FLEXERIL) 10 MG tablet Take 1 tablet (10 mg total) by mouth 3 (three) times daily as needed for muscle spasms. 07/01/16  Yes Street, Mercedes, PA-C  mometasone (NASONEX) 50 MCG/ACT nasal spray Place 2 sprays into the nose daily. Patient taking differently: Place 2 sprays into the nose daily as  needed.  01/04/16  Yes Melynda Ripple, MD  Multiple Vitamin (MULTIVITAMIN) capsule Take 1 capsule by mouth daily.   Yes [provider]  Naproxen-Esomeprazole (VIMOVO) 500-20 MG TBEC Take 1 tablet by mouth 2 (two) times daily. 07/05/16  Yes Gerda Diss, DO  ondansetron (ZOFRAN) 4 MG tablet Take 1 tablet (4 mg total) by mouth every 8 (eight) hours as needed for nausea or vomiting. 10/24/16  Yes Nche, Charlene Brooke, NP  traZODone (DESYREL) 50 MG tablet Take 50 mg by mouth at bedtime.  08/24/14  Yes [provider]  gabapentin (NEURONTIN) 300 MG capsule Start with 1 tab po qhs X 1 week, then increase to 1 tab po bid X 1 week then 1 tab po tid prn Patient not taking: Reported on 10/24/2016 09/07/16   Gerda Diss, DO  HYDROcodone-acetaminophen (NORCO/VICODIN) 5-325 MG tablet Take 1-2 tablets by mouth every 6 (six) hours as needed for severe pain. 11/07/16   Russo, [place] N, PA-C  Ibuprofen-Famotidine (DUEXIS) 800-26.6 MG TABS Take 1 tablet by mouth 3 (three) times daily as needed. 1 tab po tid X 14 days then 1 tab po tid as needed 10/25/16   Gerda Diss, DO  sulfamethoxazole-trimethoprim (BACTRIM DS,SEPTRA DS) 800-160 MG tablet Take 1 tablet by mouth 2 (two) times daily. 11/07/16 11/10/16  Russo, [place] N, PA-C    Family History Family History  Problem Relation Age of Onset  . Heart disease Mother 94       heart attack  . Depression Mother   . Hyperlipidemia Mother   . Hypertension Father 38  . Drug abuse Father   . Depression Maternal Aunt   . Anxiety disorder Maternal Aunt   . Anxiety disorder Paternal Grandmother   . Arthritis Paternal Grandmother   . Depression Paternal Grandmother   . Depression Maternal Grandmother   . Depression Maternal Grandfather   . Depression Paternal Grandfather     Social History Social History  Substance Use Topics  . Smoking status: Never Smoker  . Smokeless tobacco: Never Used  . Alcohol use No     Allergies   Mango  flavor; Amoxicillin; and Penicillins   Review of Systems Review of Systems  Constitutional: Negative for chills and fever.  Gastrointestinal: Positive for abdominal pain, diarrhea and nausea. Negative for blood in stool.  Genitourinary: Positive for dysuria, flank pain and frequency. Negative for vaginal bleeding and vaginal discharge.  All other systems reviewed and are negative.    Physical Exam Updated Vital Signs BP 120/85 (BP Location: Left Arm)   Pulse 68   Temp 98.5 F (36.9 C) (Oral)   Resp 16   LMP 10/24/2013   SpO2 100%   Physical Exam  Constitutional: She appears well-developed and well-nourished. No distress.  HENT:  Head: Normocephalic and atraumatic.  Mouth/Throat: Oropharynx is clear and moist.  Eyes: Conjunctivae are normal.  Cardiovascular: Normal rate, regular rhythm, normal heart sounds and intact distal pulses.  Pulmonary/Chest: Effort normal and breath sounds normal. No respiratory distress. She has no wheezes. She has no rales.  Abdominal: Soft. Normal appearance and bowel sounds are normal. She exhibits no distension and no mass. There is tenderness in the right upper quadrant, right lower quadrant and suprapubic area. There is CVA tenderness (right). There is no rebound, no guarding and negative Murphy's sign.  Neurological: She is alert.  Skin: Skin is warm.  Psychiatric: She has a normal mood and affect. Her behavior is normal.  Nursing note and vitals reviewed.    ED Treatments / Results  Labs (all labs ordered are listed, but only abnormal results are displayed) Labs Reviewed  COMPREHENSIVE METABOLIC PANEL - Abnormal; Notable for the following:       Result Value   Glucose, Bld 100 (*)    BUN <5 (*)    AST 14 (*)    ALT 12 (*)    All other components within normal limits  CBC - Abnormal; Notable for the following:    MCH 24.5 (*)    RDW 15.9 (*)    All other components within normal limits  URINALYSIS, ROUTINE W REFLEX MICROSCOPIC -  Abnormal; Notable for the following:    APPearance HAZY (*)    Hgb urine dipstick SMALL (*)    Leukocytes, UA MODERATE (*)    Bacteria, UA RARE (*)    Squamous Epithelial / LPF 6-30 (*)    All other components within normal limits  URINE CULTURE  LIPASE, BLOOD  I-STAT BETA HCG BLOOD, ED ([facility], [facility], [facility] ONLY)    EKG  EKG Interpretation None       Radiology Ct Renal Stone Study  Result Date: 11/07/2016 CLINICAL DATA:  Right flank pain EXAM: CT ABDOMEN AND PELVIS WITHOUT CONTRAST TECHNIQUE: Multidetector CT imaging of the abdomen and pelvis was performed following the standard protocol without IV contrast. COMPARISON:  CT 02/22/2014, 04/23/2013 FINDINGS: Lower chest: Negative Hepatobiliary: Normal liver and gallbladder.  No biliary dilatation. Pancreas: Negative Spleen: Negative Adrenals/Urinary Tract: Negative for urinary tract calculi. No renal obstruction. Normal urinary bladder. 20 mm left upper pole renal lesion with water density most consistent with a cyst and unchanged from prior studies. Stomach/Bowel: Negative for bowel obstruction or edema. Appendectomy. Vascular/Lymphatic: Negative Reproductive: Mild prostate enlargement Other: No free fluid Musculoskeletal: Negative IMPRESSION: No acute abnormality.  Negative for urinary tract calculi 2 cm left upper pole renal lesion most likely a cyst. Electronically Signed   By: Franchot Gallo M.D.   On: 11/07/2016 11:26    Procedures Procedures (including critical care time)  Medications Ordered in ED Medications  ondansetron (ZOFRAN) injection 4 mg (4 mg Intravenous Given 11/07/16 1055)  morphine 4 MG/ML injection 4 mg (4 mg Intravenous Given 11/07/16 1055)     Initial Impression / Assessment and Plan / ED Course  I have reviewed the triage vital signs and the nursing notes.  Pertinent labs & imaging results that were available during my care of the patient were reviewed by me and considered in my medical decision making (see chart  for details).     Patient presenting with a few days of sharp intermittent right flank pain with urinary symptoms.CT stone study negative for stone, gallbladder appears normal, remaining of exam normal. Urine with moderate leukocytes, 0-5 WBC, raer bacteria and 6-30 squamous. UA with vague signs of infection, likely a dirty specimen. However, given patient's dysuria and urinary frequency, will treat with Bactrim for UTI and advise of strict return  precautions. Patient to follow-up closely with her PCP in 2 days Discussed possibility of development of pyelonephritis, and pt verbalized understanding of close follow up. Pt is afebrile, not in distress, safe for discharge.  Patient discussed with Dr. Jeneen Rinks, who guided treatment.  Discussed results, findings, treatment and follow up. Patient advised of return precautions. Patient verbalized understanding and agreed with plan.   Final Clinical Impressions(s) / ED Diagnoses   Final diagnoses:  Dysuria  Right flank pain    New Prescriptions New Prescriptions   HYDROCODONE-ACETAMINOPHEN (NORCO/VICODIN) 5-325 MG TABLET    Take 1-2 tablets by mouth every 6 (six) hours as needed for severe pain.   SULFAMETHOXAZOLE-TRIMETHOPRIM (BACTRIM DS,SEPTRA DS) 800-160 MG TABLET    Take 1 tablet by mouth 2 (two) times daily.     Russo, [place] N, PA-C 11/07/16 1239    Tanna Furry, MD 11/12/16 [phone]

## 2016-11-07 NOTE — ED Triage Notes (Signed)
Patient complains of right flank pain with diarrhea and dysuria x 2 days. Complains of urinary frequency with same, NAD

## 2016-11-07 NOTE — Discharge Instructions (Signed)
Please read instructions below. Take your antibiotic, Bactrim, as directed until it is gone. Take your previously prescribed zofran/ondansetron, as prescribed for nausea. You can take hydrocodone every 6 hours as needed for severe pain. Do not take tylenol, drive or drink alcohol while taking this medication. Drink plenty of water. Schedule an appointment with your primary care provider in 2 days to follow up. If symptoms persist, you may need to take the antibiotics for a longer period of time. Return to the ER if you develop a fever, have worsening back pain, or new or concerning symptoms.

## 2016-11-08 ENCOUNTER — Encounter: Payer: Self-pay | Admitting: Nurse Practitioner

## 2016-11-08 ENCOUNTER — Ambulatory Visit (INDEPENDENT_AMBULATORY_CARE_PROVIDER_SITE_OTHER): Payer: Federal, State, Local not specified - PPO | Admitting: Nurse Practitioner

## 2016-11-08 VITALS — BP 112/78 | HR 78 | Temp 98.4°F | Ht 67.0 in | Wt 247.0 lb

## 2016-11-08 DIAGNOSIS — N3001 Acute cystitis with hematuria: Secondary | ICD-10-CM | POA: Diagnosis not present

## 2016-11-08 LAB — URINE CULTURE

## 2016-11-08 MED ORDER — PHENAZOPYRIDINE HCL 100 MG PO TABS: 100.0000 mg | ORAL_TABLET | Freq: Three times a day (TID) | ORAL | 0 refills | Status: DC | PRN

## 2016-11-08 NOTE — Progress Notes (Signed)
Subjective:  Patient ID: Tiffany [name], female    DOB: Jun 08, 1975  Age: 41 y.o. MRN: [id number]  CC: Hospitalization Follow-up (ER follow up--dx blood in urine,UTI lead to kidney infection, weak,burns when urinate)   Urinary Tract Infection   This is a new problem. The current episode started yesterday. The problem occurs every urination. The problem has been unchanged. The quality of the pain is described as burning. There has been no fever. She is sexually active. There is no history of pyelonephritis. Associated symptoms include chills. Pertinent negatives include no discharge, flank pain, frequency, hematuria, hesitancy, nausea, possible pregnancy, sweats, urgency or vomiting. She has tried increased fluids, antibiotics and acetaminophen for the symptoms. The treatment provided mild relief. There is no history of catheterization, kidney stones or urinary stasis.   Persistent ABD pain and dysuria No fever No vaginal discharge.  Outpatient Medications Prior to Visit  Medication Sig Dispense Refill  . buPROPion (WELLBUTRIN XL) 150 MG 24 hr tablet Take 150 mg by mouth daily.    . busPIRone (BUSPAR) 15 MG tablet Take 15 mg by mouth 3 (three) times daily.    . clobetasol ointment (TEMOVATE) 3.53 % Apply 1 application topically daily.     . clonazePAM (KLONOPIN) 1 MG tablet Take 1 mg by mouth daily as needed for anxiety.     . cyclobenzaprine (FLEXERIL) 10 MG tablet Take 1 tablet (10 mg total) by mouth 3 (three) times daily as needed for muscle spasms. 15 tablet 0  . HYDROcodone-acetaminophen (NORCO/VICODIN) 5-325 MG tablet Take 1-2 tablets by mouth every 6 (six) hours as needed for severe pain. 6 tablet 0  . Ibuprofen-Famotidine (DUEXIS) 800-26.6 MG TABS Take 1 tablet by mouth 3 (three) times daily as needed. 1 tab po tid X 14 days then 1 tab po tid as needed 90 tablet 2  . mometasone (NASONEX) 50 MCG/ACT nasal spray Place 2 sprays into the nose daily. (Patient taking differently: Place 2 sprays  into the nose daily as needed. ) 17 g 0  . Multiple Vitamin (MULTIVITAMIN) capsule Take 1 capsule by mouth daily.    . Naproxen-Esomeprazole (VIMOVO) 500-20 MG TBEC Take 1 tablet by mouth 2 (two) times daily. 60 tablet 2  . ondansetron (ZOFRAN) 4 MG tablet Take 1 tablet (4 mg total) by mouth every 8 (eight) hours as needed for nausea or vomiting. 20 tablet 0  . sulfamethoxazole-trimethoprim (BACTRIM DS,SEPTRA DS) 800-160 MG tablet Take 1 tablet by mouth 2 (two) times daily. 6 tablet 0  . traZODone (DESYREL) 50 MG tablet Take 50 mg by mouth at bedtime.     . gabapentin (NEURONTIN) 300 MG capsule Start with 1 tab po qhs X 1 week, then increase to 1 tab po bid X 1 week then 1 tab po tid prn (Patient not taking: Reported on 10/24/2016) 90 capsule 1   No facility-administered medications prior to visit.     ROS See HPI  Objective:  BP 112/78   Pulse 78   Temp 98.4 F (36.9 C)   Ht 5\' 7"  (1.702 m)   Wt 247 lb (112 kg)   LMP 10/24/2013   SpO2 100%   BMI 38.69 kg/m   BP Readings from Last 3 Encounters:  11/08/16 112/78  11/07/16 129/77  10/24/16 116/80    Wt Readings from Last 3 Encounters:  11/08/16 247 lb (112 kg)  10/24/16 248 lb (112.5 kg)  10/05/16 248 lb 6.4 oz (112.7 kg)    Physical Exam  Constitutional: She is  oriented to person, place, and time. No distress.  Cardiovascular: Normal rate.   Pulmonary/Chest: Effort normal.  Abdominal: She exhibits no distension. There is tenderness. There is no rebound and no guarding.  No CVA tenderness  Neurological: She is alert and oriented to person, place, and time.  Skin: Skin is warm and dry. No rash noted. No erythema.  Psychiatric: She has a normal mood and affect. Her behavior is normal.  Vitals reviewed.   Lab Results  Component Value Date   WBC 6.9 11/07/2016   HGB 12.2 11/07/2016   HCT 39.0 11/07/2016   PLT 339 11/07/2016   GLUCOSE 100 (H) 11/07/2016   CHOL 211 (H) 12/23/2015   TRIG 131.0 12/23/2015   HDL 40.40  12/23/2015   LDLCALC 145 (H) 12/23/2015   ALT 12 (L) 11/07/2016   AST 14 (L) 11/07/2016   NA 138 11/07/2016   K 3.5 11/07/2016   CL 106 11/07/2016   CREATININE 0.90 11/07/2016   BUN <5 (L) 11/07/2016   CO2 25 11/07/2016   TSH 1.76 12/23/2015    Ct Renal Stone Study  Result Date: 11/07/2016 CLINICAL DATA:  Right flank pain EXAM: CT ABDOMEN AND PELVIS WITHOUT CONTRAST TECHNIQUE: Multidetector CT imaging of the abdomen and pelvis was performed following the standard protocol without IV contrast. COMPARISON:  CT 02/22/2014, 04/23/2013 FINDINGS: Lower chest: Negative Hepatobiliary: Normal liver and gallbladder.  No biliary dilatation. Pancreas: Negative Spleen: Negative Adrenals/Urinary Tract: Negative for urinary tract calculi. No renal obstruction. Normal urinary bladder. 20 mm left upper pole renal lesion with water density most consistent with a cyst and unchanged from prior studies. Stomach/Bowel: Negative for bowel obstruction or edema. Appendectomy. Vascular/Lymphatic: Negative Reproductive: Mild prostate enlargement Other: No free fluid Musculoskeletal: Negative IMPRESSION: No acute abnormality.  Negative for urinary tract calculi 2 cm left upper pole renal lesion most likely a cyst. Electronically Signed   By: Franchot Gallo M.D.   On: 11/07/2016 11:26    Assessment & Plan:   Tiffany [name] was seen today for hospitalization follow-up.  Diagnoses and all orders for this visit:  Acute cystitis with hematuria -     phenazopyridine (PYRIDIUM) 100 MG tablet; Take 1 tablet (100 mg total) by mouth 3 (three) times daily as needed for pain (with food). -     ciprofloxacin (CIPRO) 500 MG tablet; Take 1 tablet (500 mg total) by mouth 2 (two) times daily. -     Urinalysis with Culture, if indicated; Future   I am having Tiffany [name] start on phenazopyridine and ciprofloxacin. I am also having her maintain her multivitamin, clobetasol ointment, traZODone, mometasone, buPROPion, busPIRone,  cyclobenzaprine, Naproxen-Esomeprazole, gabapentin, ondansetron, Ibuprofen-Famotidine, clonazePAM, sulfamethoxazole-trimethoprim, and HYDROcodone-acetaminophen.  Meds ordered this encounter  Medications  . phenazopyridine (PYRIDIUM) 100 MG tablet    Sig: Take 1 tablet (100 mg total) by mouth 3 (three) times daily as needed for pain (with food).    Dispense:  10 tablet    Refill:  0    Order Specific Question:   Supervising Provider    Answer:   Binnie Rail [[id number]]  . ciprofloxacin (CIPRO) 500 MG tablet    Sig: Take 1 tablet (500 mg total) by mouth 2 (two) times daily.    Dispense:  6 tablet    Refill:  0    Order Specific Question:   Supervising Provider    Answer:   Binnie Rail [[id number]]    Follow-up: No Follow-up on file.  [name], NP

## 2016-11-08 NOTE — Patient Instructions (Addendum)
Push oral hydration  Take current oral abx as prescribed.  It seems the hospital lab was not able to complete urine culture. They are recommending a recollection of urine sample. Please inquire with patient if she was contacted about this. If not, I have sent additional oral abx to complete UTI treatment. Once she completes treatment, she need to return to our lab for repeat urinalysis.  Urinary Tract Infection, Adult A urinary tract infection (UTI) is an infection of any part of the urinary tract, which includes the kidneys, ureters, bladder, and urethra. These organs make, store, and get rid of urine in the body. UTI can be a bladder infection (cystitis) or kidney infection (pyelonephritis). What are the causes? This infection may be caused by fungi, viruses, or bacteria. Bacteria are the most common cause of UTIs. This condition can also be caused by repeated incomplete emptying of the bladder during urination. What increases the risk? This condition is more likely to develop if:  You ignore your need to urinate or hold urine for long periods of time.  You do not empty your bladder completely during urination.  You wipe back to front after urinating or having a bowel movement, if you are female.  You are uncircumcised, if you are female.  You are constipated.  You have a urinary catheter that stays in place (indwelling).  You have a weak defense (immune) system.  You have a medical condition that affects your bowels, kidneys, or bladder.  You have diabetes.  You take antibiotic medicines frequently or for long periods of time, and the antibiotics no longer work well against certain types of infections (antibiotic resistance).  You take medicines that irritate your urinary tract.  You are exposed to chemicals that irritate your urinary tract.  You are female.  What are the signs or symptoms? Symptoms of this condition include:  Fever.  Frequent urination or passing  small amounts of urine frequently.  Needing to urinate urgently.  Pain or burning with urination.  Urine that smells bad or unusual.  Cloudy urine.  Pain in the lower abdomen or back.  Trouble urinating.  Blood in the urine.  Vomiting or being less hungry than normal.  Diarrhea or abdominal pain.  Vaginal discharge, if you are female.  How is this diagnosed? This condition is diagnosed with a medical history and physical exam. You will also need to provide a urine sample to test your urine. Other tests may be done, including:  Blood tests.  Sexually transmitted disease (STD) testing.  If you have had more than one UTI, a cystoscopy or imaging studies may be done to determine the cause of the infections. How is this treated? Treatment for this condition often includes a combination of two or more of the following:  Antibiotic medicine.  Other medicines to treat less common causes of UTI.  Over-the-counter medicines to treat pain.  Drinking enough water to stay hydrated.  Follow these instructions at home:  Take over-the-counter and prescription medicines only as told by your health care provider.  If you were prescribed an antibiotic, take it as told by your health care provider. Do not stop taking the antibiotic even if you start to feel better.  Avoid alcohol, caffeine, tea, and carbonated beverages. They can irritate your bladder.  Drink enough fluid to keep your urine clear or pale yellow.  Keep all follow-up visits as told by your health care provider. This is important.  Make sure to: ? Empty your bladder often  and completely. Do not hold urine for long periods of time. ? Empty your bladder before and after sex. ? Wipe from front to back after a bowel movement if you are female. Use each tissue one time when you wipe. Contact a health care provider if:  You have back pain.  You have a fever.  You feel nauseous or vomit.  Your symptoms do not get  better after 3 days.  Your symptoms go away and then return. Get help right away if:  You have severe back pain or lower abdominal pain.  You are vomiting and cannot keep down any medicines or water. This information is not intended to replace advice given to you by your health care provider. Make sure you discuss any questions you have with your health care provider. Document Released: 10/19/2004 Document Revised: 06/23/2015 Document Reviewed: 11/30/2014 Elsevier Interactive Patient Education  2017 [employer].

## 2016-11-10 MED ORDER — CIPROFLOXACIN HCL 500 MG PO TABS: 500.0000 mg | ORAL_TABLET | Freq: Two times a day (BID) | ORAL | 0 refills | Status: DC

## 2016-11-13 ENCOUNTER — Telehealth: Payer: Self-pay | Admitting: Nurse Practitioner

## 2016-11-13 NOTE — Telephone Encounter (Signed)
Tried to call again. Can not leave vm.

## 2016-11-13 NOTE — Telephone Encounter (Signed)
Can not leave vm for the pt, will call again later.

## 2016-11-13 NOTE — Telephone Encounter (Signed)
-----   Message from Flossie Buffy, NP sent at 11/10/2016  2:28 PM EDT ----- It seems the hospital lab was not able to complete urine culture. They are recommending a recollection of urine sample. Please inquire with patient if she was contacted about this. If not, I have sent additional oral abx to complete UTI treatment. Once she completes treatment, she need to return to our lab for repeat urinalysis. Thank you

## 2016-11-14 NOTE — Telephone Encounter (Signed)
Tried to call pt again, pick up but hung up.

## 2016-11-15 NOTE — Telephone Encounter (Signed)
Tried to call pt again, phone ring but cant leave vm.

## 2016-11-21 ENCOUNTER — Encounter: Payer: Self-pay | Admitting: Nurse Practitioner

## 2016-11-21 ENCOUNTER — Ambulatory Visit (INDEPENDENT_AMBULATORY_CARE_PROVIDER_SITE_OTHER): Payer: Federal, State, Local not specified - PPO | Admitting: Nurse Practitioner

## 2016-11-21 ENCOUNTER — Other Ambulatory Visit (INDEPENDENT_AMBULATORY_CARE_PROVIDER_SITE_OTHER): Payer: Federal, State, Local not specified - PPO

## 2016-11-21 VITALS — BP 128/66 | HR 68 | Temp 98.6°F | Ht 67.0 in | Wt 251.0 lb

## 2016-11-21 DIAGNOSIS — Z0001 Encounter for general adult medical examination with abnormal findings: Secondary | ICD-10-CM

## 2016-11-21 DIAGNOSIS — Z13220 Encounter for screening for lipoid disorders: Secondary | ICD-10-CM

## 2016-11-21 DIAGNOSIS — J069 Acute upper respiratory infection, unspecified: Secondary | ICD-10-CM | POA: Diagnosis not present

## 2016-11-21 DIAGNOSIS — Z136 Encounter for screening for cardiovascular disorders: Secondary | ICD-10-CM

## 2016-11-21 DIAGNOSIS — Z111 Encounter for screening for respiratory tuberculosis: Secondary | ICD-10-CM | POA: Diagnosis not present

## 2016-11-21 DIAGNOSIS — N3001 Acute cystitis with hematuria: Secondary | ICD-10-CM

## 2016-11-21 LAB — TSH: TSH: 0.98 u[IU]/mL (ref 0.35–4.50)

## 2016-11-21 LAB — URINALYSIS WITH CULTURE, IF INDICATED
BILIRUBIN URINE: NEGATIVE
HGB URINE DIPSTICK: NEGATIVE
KETONES UR: NEGATIVE
LEUKOCYTES UA: NEGATIVE
NITRITE: NEGATIVE
RBC / HPF: NONE SEEN (ref 0–?)
Specific Gravity, Urine: 1.02 (ref 1.000–1.030)
TOTAL PROTEIN, URINE-UPE24: NEGATIVE
URINE GLUCOSE: NEGATIVE
UROBILINOGEN UA: 0.2 (ref 0.0–1.0)
WBC, UA: NONE SEEN (ref 0–?)
pH: 6 (ref 5.0–8.0)

## 2016-11-21 LAB — POC INFLUENZA A&B (BINAX/QUICKVUE)
Influenza A, POC: NEGATIVE
Influenza B, POC: NEGATIVE

## 2016-11-21 LAB — LIPID PANEL
Cholesterol: 158 mg/dL (ref 0–200)
HDL: 32.9 mg/dL — ABNORMAL LOW (ref >39.00)
LDL CALC: 94 mg/dL (ref 0–99)
NONHDL: 125.44
TRIGLYCERIDES: 155 mg/dL — AB (ref 0.0–149.0)
Total CHOL/HDL Ratio: 5
VLDL: 31 mg/dL (ref 0.0–40.0)

## 2016-11-21 LAB — POCT RAPID STREP A (OFFICE): Rapid Strep A Screen: NEGATIVE

## 2016-11-21 MED ORDER — FLUTICASONE PROPIONATE 50 MCG/ACT NA SUSP: 2.0000 | Freq: Every day | NASAL | 0 refills | Status: DC

## 2016-11-21 MED ORDER — OXYMETAZOLINE HCL 0.05 % NA SOLN: 1.0000 | Freq: Two times a day (BID) | NASAL | 0 refills | Status: AC

## 2016-11-21 MED ORDER — PROMETHAZINE-DM 6.25-15 MG/5ML PO SYRP: 5.0000 mL | ORAL_SOLUTION | Freq: Three times a day (TID) | ORAL | 0 refills | Status: AC | PRN

## 2016-11-21 MED ORDER — DM-GUAIFENESIN ER 30-600 MG PO TB12: 1.0000 | ORAL_TABLET | Freq: Two times a day (BID) | ORAL | 0 refills | Status: AC | PRN

## 2016-11-21 MED ORDER — ALBUTEROL SULFATE HFA 108 (90 BASE) MCG/ACT IN AERS: 1.0000 | INHALATION_SPRAY | Freq: Four times a day (QID) | RESPIRATORY_TRACT | 0 refills | Status: DC | PRN

## 2016-11-21 NOTE — Progress Notes (Signed)
Subjective:    Patient ID: Tiffany [name], female    DOB: 07-Oct-1975, 41 y.o.   MRN: [id number]  Patient presents today for complete physical and complains of sore throat.  Sore Throat   This is a new problem. The current episode started in the past 7 days. The problem has been unchanged. There has been no fever. Associated symptoms include congestion, coughing, headaches, a hoarse voice, a plugged ear sensation, shortness of breath and swollen glands. Pertinent negatives include no diarrhea, stridor or vomiting. She has had no exposure to strep or mono. She has tried nothing for the symptoms.   Sore throat, nasal congestion and cough x 2days SOB with exertion.  Anxiety and Depression: Managed by psychiatrist. Stable mood with wellbutrin, buspar, klonopin and trazodone.  UTI: Improved symptoms. She has complete oral abx as prescribed.  Immunizations: (TDAP, Hep C screen, Pneumovax, Influenza, zoster)  Health Maintenance  Topic Date Due  . Pap Smear  07/02/1995  . HIV Screening  01/23/2017*  . Flu Shot  04/23/2017*  . Tetanus Vaccine  08/06/2021  *Topic was postponed. The date shown is not the original due date.   Diet:regular.  Weight:  Wt Readings from Last 3 Encounters:  11/21/16 251 lb (113.9 kg)  11/08/16 247 lb (112 kg)  10/24/16 248 lb (112.5 kg)    Exercise: none.  Fall Risk: Fall Risk  10/24/2016 05/09/2016 12/23/2015  Falls in the past year? Yes No No  Number falls in past yr: 1 - -  Injury with Fall? Yes - -  Comment back pain and sprain ankle - -   Home Safety:home with husband and children.  Depression/Suicide: managed by psychiatry. Mood is stable with klonopin, buspar, trazodone and wellbutrin.  Pap Smear (every 53yr for >21-29 without HPV, every 519yrfor >30-6532yrith HPV):up to date  Vision:Vision Screening Comments: Completed 10/18 at [facility] date  Advanced Directive: Advanced Directives 11/07/2016  Does Patient Have a Medical  Advance Directive? No  Would patient like information on creating a medical advance directive? No - Patient declined   Sexual History (birth control, marital status, STD):married and sexually active  Medications and allergies reviewed with patient and updated if appropriate.  Patient Active Problem List   Diagnosis Date Noted  . Proximal leg weakness 10/05/2016  . Low back pain with right-sided sciatica 09/07/2016  . Ganglion cyst of flexor tendon sheath of finger of left hand 07/25/2016  . Trigger ring finger of right hand 07/05/2016  . High ankle sprain of right lower extremity 07/05/2016  . Primary osteoarthritis of right knee 05/11/2016  . Encounter for preventative adult health care exam with abnormal findings 12/23/2015  . Stuttering 12/23/2015  . Allergy to penicillin 10/08/2014  . Panic disorder without agoraphobia with severe panic attacks 08/05/2014  . Depression, major, recurrent, moderate ([facility]6/09/2014    Class: Chronic  . Generalized anxiety disorder 07/02/2014    Class: Chronic  . Urinary incontinence in female 02/20/2014  . Post-operative pain 01/28/2014  . Ventral hernia 05/09/2013    Current Outpatient Prescriptions on File Prior to Visit  Medication Sig Dispense Refill  . buPROPion (WELLBUTRIN XL) 150 MG 24 hr tablet Take 150 mg by mouth daily.    . busPIRone (BUSPAR) 15 MG tablet Take 15 mg by mouth 3 (three) times daily.    . clobetasol ointment (TEMOVATE) 0.03.32Apply 1 application topically daily.     . clonazePAM (KLONOPIN) 1 MG tablet Take 1 mg by  mouth daily as needed for anxiety.     . Ibuprofen-Famotidine (DUEXIS) 800-26.6 MG TABS Take 1 tablet by mouth 3 (three) times daily as needed. 1 tab po tid X 14 days then 1 tab po tid as needed 90 tablet 2  . Multiple Vitamin (MULTIVITAMIN) capsule Take 1 capsule by mouth daily.    . ondansetron (ZOFRAN) 4 MG tablet Take 1 tablet (4 mg total) by mouth every 8 (eight) hours as needed for nausea or vomiting. 20  tablet 0  . traZODone (DESYREL) 50 MG tablet Take 50 mg by mouth at bedtime.      No current facility-administered medications on file prior to visit.     Past Medical History:  Diagnosis Date  . Anemia   . Anxiety   . Chicken pox 1981  . Depression   . GERD (gastroesophageal reflux disease)   . Hay fever 2015  . PTSD (post-traumatic stress disorder)   . Umbilical hernia     Past Surgical History:  Procedure Laterality Date  . ABDOMINAL HYSTERECTOMY Bilateral 01/06/2014   Procedure: HYSTERECTOMY ABDOMINAL WITH BILATERAL SALPINGECTOMY ;  Surgeon: Delice Lesch, MD;  Location: [facility];  Service: Gynecology;  Laterality: Bilateral;  . ABDOMINAL HYSTERECTOMY  2016  . APPENDECTOMY     2002  . CESAREAN SECTION     2003  . LAPAROSCOPIC HYSTERECTOMY N/A 01/06/2014   Procedure: Attempted  TOTAL LAPAROSCOPIC Hysterectomy,;  Surgeon: Delice Lesch, MD;  Location: [facility];  Service: Gynecology;  Laterality: N/A;  . TRANSVERSE COLON RESECTION N/A 01/06/2014   Procedure: OVERSEW OF SEROSA TEAR OF THE TRANSVERSE COLON ;  Surgeon: Alphonsa Overall, MD;  Location: [facility];  Service: General;  Laterality: N/A;  . TUBAL LIGATION     2013    Social History   Social History  . Marital status: Married    Spouse name: N/A  . Number of children: N/A  . Years of education: N/A   Social History Main Topics  . Smoking status: Never Smoker  . Smokeless tobacco: Never Used  . Alcohol use No  . Drug use: No  . Sexual activity: Yes   Other Topics Concern  . None   Social History Narrative   Work or School: works for [employer]: lives with husband and 4 children (12-20yo), husband is supportive "wonderful"      Spiritual Beliefs: Christian      Lifestyle: walks 3 times per week; working on diet             Family History  Problem Relation Age of Onset  . Heart disease Mother 31       heart attack  .  Depression Mother   . Hyperlipidemia Mother   . Hypertension Father 40  . Drug abuse Father   . Depression Maternal Aunt   . Anxiety disorder Maternal Aunt   . Anxiety disorder Paternal Grandmother   . Arthritis Paternal Grandmother   . Depression Paternal Grandmother   . Depression Maternal Grandmother   . Diabetes Maternal Grandmother   . Depression Maternal Grandfather   . Depression Paternal Grandfather         Review of Systems  Constitutional: Negative for fever, malaise/fatigue and weight loss.  HENT: Positive for congestion and hoarse voice. Negative for sore throat.   Eyes:       Negative for visual changes  Respiratory: Positive for cough and shortness of  breath. Negative for stridor.   Cardiovascular: Negative for chest pain, palpitations and leg swelling.  Gastrointestinal: Negative for blood in stool, constipation, diarrhea, heartburn and vomiting.  Genitourinary: Negative for dysuria, frequency and urgency.  Musculoskeletal: Negative for falls, joint pain and myalgias.  Skin: Negative for rash.  Neurological: Positive for headaches. Negative for dizziness and sensory change.  Endo/Heme/Allergies: Does not bruise/bleed easily.  Psychiatric/Behavioral: Negative for substance abuse and suicidal ideas.    Objective:   Vitals:   11/21/16 0813  BP: 128/66  Pulse: 68  Temp: 98.6 F (37 C)  SpO2: 98%    Body mass index is 39.31 kg/m.   Physical Examination:  Physical Exam  Constitutional: She is oriented to person, place, and time. No distress.  HENT:  Right Ear: Tympanic membrane, external ear and ear canal normal.  Left Ear: Tympanic membrane, external ear and ear canal normal.  Nose: Mucosal edema and rhinorrhea present. Right sinus exhibits maxillary sinus tenderness and frontal sinus tenderness. Left sinus exhibits maxillary sinus tenderness and frontal sinus tenderness.  Mouth/Throat: Uvula is midline. No trismus in the jaw. Posterior oropharyngeal  erythema present.  Eyes: Pupils are equal, round, and reactive to light. Conjunctivae and EOM are normal.  Neck: Normal range of motion. Neck supple. No thyromegaly present.  Cardiovascular: Normal rate, regular rhythm and normal heart sounds.   Musculoskeletal: She exhibits no edema.  Lymphadenopathy:    She has cervical adenopathy.  Neurological: She is alert and oriented to person, place, and time. Gait normal.  Skin: Skin is warm and dry. No rash noted.  Psychiatric: Affect and judgment normal.  Vitals reviewed.   ASSESSMENT and PLAN:  Temitope was seen today for annual exam and sore throat.  Diagnoses and all orders for this visit:  Encounter for preventative adult health care exam with abnormal findings -     TSH; Future -     Lipid panel; Future  Acute URI -     Cancel: POCT rapid strep A -     Cancel: POCT Influenza A/B -     Discontinue: albuterol (PROVENTIL HFA;VENTOLIN HFA) 108 (90 Base) MCG/ACT inhaler; Inhale 1-2 puffs into the lungs every 6 (six) hours as needed. -     promethazine-dextromethorphan (PROMETHAZINE-DM) 6.25-15 MG/5ML syrup; Take 5 mLs by mouth 3 (three) times daily as needed for cough. -     fluticasone (FLONASE) 50 MCG/ACT nasal spray; Place 2 sprays into both nostrils daily. -     oxymetazoline (AFRIN NASAL SPRAY) 0.05 % nasal spray; Place 1 spray into both nostrils 2 (two) times daily. Use only for 3days, then stop -     dextromethorphan-guaiFENesin (MUCINEX DM) 30-600 MG 12hr tablet; Take 1 tablet by mouth 2 (two) times daily as needed for cough. -     POC Influenza A&B (Binax test) -     Rapid Strep A  Encounter for lipid screening for cardiovascular disease -     Lipid panel; Future  Screening-pulmonary TB -     TB Skin Test    No problem-specific Assessment & Plan notes found for this encounter.  Recent Results (from the past 2160 hour(s))  Basic metabolic panel     Status: None   Collection Time: 10/24/16  2:44 PM  Result Value Ref  Range   Sodium 140 135 - 145 mEq/L   Potassium 3.5 3.5 - 5.1 mEq/L   Chloride 105 96 - 112 mEq/L   CO2 31 19 - 32 mEq/L   Glucose, Bld  90 70 - 99 mg/dL   BUN 7 6 - 23 mg/dL   Creatinine, Ser 0.82 0.40 - 1.20 mg/dL   Calcium 8.9 8.4 - 10.5 mg/dL   GFR 98.17 >60.00 mL/min  Magnesium     Status: None   Collection Time: 10/24/16  2:44 PM  Result Value Ref Range   Magnesium 2.2 1.5 - 2.5 mg/dL  CBC     Status: Abnormal   Collection Time: 10/24/16  2:44 PM  Result Value Ref Range   WBC 6.9 4.0 - 10.5 K/uL   RBC 4.85 3.87 - 5.11 Mil/uL   Platelets 333.0 150.0 - 400.0 K/uL   Hemoglobin 12.1 12.0 - 15.0 g/dL   HCT 38.6 36.0 - 46.0 %   MCV 79.6 78.0 - 100.0 fl   MCHC 31.2 30.0 - 36.0 g/dL   RDW 16.2 (H) 11.5 - 15.5 %  Urinalysis with Culture, if indicated     Status: None   Collection Time: 10/24/16  2:44 PM  Result Value Ref Range   Color, Urine YELLOW Yellow;Lt. Yellow   APPearance CLEAR Clear   Specific Gravity, Urine 1.015 1.000 - 1.030   pH 6.0 5.0 - 8.0   Total Protein, Urine NEGATIVE Negative   Urine Glucose NEGATIVE Negative   Ketones, ur NEGATIVE Negative   Bilirubin Urine NEGATIVE Negative   Hgb urine dipstick NEGATIVE Negative   Urobilinogen, UA 0.2 0.0 - 1.0   Leukocytes, UA NEGATIVE Negative   Nitrite NEGATIVE Negative   WBC, UA none seen 0-2/hpf   RBC / HPF none seen 0-2/hpf  Lipase, blood     Status: None   Collection Time: 11/07/16  8:34 AM  Result Value Ref Range   Lipase 19 11 - 51 U/L  Comprehensive metabolic panel     Status: Abnormal   Collection Time: 11/07/16  8:34 AM  Result Value Ref Range   Sodium 138 135 - 145 mmol/L   Potassium 3.5 3.5 - 5.1 mmol/L   Chloride 106 101 - 111 mmol/L   CO2 25 22 - 32 mmol/L   Glucose, Bld 100 (H) 65 - 99 mg/dL   BUN <5 (L) 6 - 20 mg/dL   Creatinine, Ser 0.90 0.44 - 1.00 mg/dL   Calcium 8.9 8.9 - 10.3 mg/dL   Total Protein 6.9 6.5 - 8.1 g/dL   Albumin 3.5 3.5 - 5.0 g/dL   AST 14 (L) 15 - 41 U/L   ALT 12 (L) 14  - 54 U/L   Alkaline Phosphatase 65 38 - 126 U/L   Total Bilirubin 0.5 0.3 - 1.2 mg/dL   GFR calc non Af Amer >60 >60 mL/min   GFR calc Af Amer >60 >60 mL/min    Comment: (NOTE) The eGFR has been calculated using the CKD EPI equation. This calculation has not been validated in all clinical situations. eGFR's persistently <60 mL/min signify possible Chronic Kidney Disease.    Anion gap 7 5 - 15  CBC     Status: Abnormal   Collection Time: 11/07/16  8:34 AM  Result Value Ref Range   WBC 6.9 4.0 - 10.5 K/uL   RBC 4.97 3.87 - 5.11 MIL/uL   Hemoglobin 12.2 12.0 - 15.0 g/dL   HCT 39.0 36.0 - 46.0 %   MCV 78.5 78.0 - 100.0 fL   MCH 24.5 (L) 26.0 - 34.0 pg   MCHC 31.3 30.0 - 36.0 g/dL   RDW 15.9 (H) 11.5 - 15.5 %   Platelets 339 150 -  400 K/uL  Urinalysis, Routine w reflex microscopic     Status: Abnormal   Collection Time: 11/07/16  8:43 AM  Result Value Ref Range   Color, Urine YELLOW YELLOW   APPearance HAZY (A) CLEAR   Specific Gravity, Urine 1.017 1.005 - 1.030   pH 5.0 5.0 - 8.0   Glucose, UA NEGATIVE NEGATIVE mg/dL   Hgb urine dipstick SMALL (A) NEGATIVE   Bilirubin Urine NEGATIVE NEGATIVE   Ketones, ur NEGATIVE NEGATIVE mg/dL   Protein, ur NEGATIVE NEGATIVE mg/dL   Nitrite NEGATIVE NEGATIVE   Leukocytes, UA MODERATE (A) NEGATIVE   RBC / HPF 0-5 0 - 5 RBC/hpf   WBC, UA 0-5 0 - 5 WBC/hpf   Bacteria, UA RARE (A) NONE SEEN   Squamous Epithelial / LPF 6-30 (A) NONE SEEN   Mucus PRESENT   Urine culture     Status: Abnormal   Collection Time: 11/07/16  8:43 AM  Result Value Ref Range   Specimen Description URINE, RANDOM    Special Requests NONE    Culture MULTIPLE SPECIES PRESENT, SUGGEST RECOLLECTION (A)    Report Status 11/08/2016 FINAL   I-Stat beta hCG blood, ED     Status: None   Collection Time: 11/07/16  8:53 AM  Result Value Ref Range   I-stat hCG, quantitative <5.0 <5 mIU/mL   Comment 3            Comment:   GEST. AGE      CONC.  (mIU/mL)   <=1 WEEK        5 -  50     2 WEEKS       50 - 500     3 WEEKS       100 - 10,000     4 WEEKS     1,000 - 30,000        FEMALE AND NON-PREGNANT FEMALE:     LESS THAN 5 mIU/mL   POC Influenza A&B (Binax test)     Status: Normal   Collection Time: 11/21/16  9:12 AM  Result Value Ref Range   Influenza A, POC Negative Negative   Influenza B, POC Negative Negative  Rapid Strep A     Status: Normal   Collection Time: 11/21/16  9:12 AM  Result Value Ref Range   Rapid Strep A Screen Negative Negative  Urinalysis with Culture, if indicated     Status: None   Collection Time: 11/21/16  9:17 AM  Result Value Ref Range   Color, Urine YELLOW Yellow;Lt. Yellow   APPearance CLEAR Clear   Specific Gravity, Urine 1.020 1.000 - 1.030   pH 6.0 5.0 - 8.0   Total Protein, Urine NEGATIVE Negative   Urine Glucose NEGATIVE Negative   Ketones, ur NEGATIVE Negative   Bilirubin Urine NEGATIVE Negative   Hgb urine dipstick NEGATIVE Negative   Urobilinogen, UA 0.2 0.0 - 1.0   Leukocytes, UA NEGATIVE Negative   Nitrite NEGATIVE Negative   WBC, UA none seen 0-2/hpf   RBC / HPF none seen 0-2/hpf   Squamous Epithelial / LPF Rare(0-4/hpf) Rare(0-4/hpf)  TSH     Status: None   Collection Time: 11/21/16  9:17 AM  Result Value Ref Range   TSH 0.98 0.35 - 4.50 uIU/mL  Lipid panel     Status: Abnormal   Collection Time: 11/21/16  9:17 AM  Result Value Ref Range   Cholesterol 158 0 - 200 mg/dL    Comment: ATP III Classification  Desirable:  < 200 mg/dL               Borderline High:  200 - 239 mg/dL          High:  > = 240 mg/dL   Triglycerides 155.0 (H) 0.0 - 149.0 mg/dL    Comment: Normal:  <150 mg/dLBorderline High:  150 - 199 mg/dL   HDL 32.90 (L) >39.00 mg/dL   VLDL 31.0 0.0 - 40.0 mg/dL   LDL Cholesterol 94 0 - 99 mg/dL   Total CHOL/HDL Ratio 5     Comment:                Men          Women1/2 Average Risk     3.4          3.3Average Risk          5.0          4.42X Average Risk          9.6          7.13X Average  Risk          15.0          11.0                       NonHDL 125.44     Comment: NOTE:  Non-HDL goal should be 30 mg/dL higher than patient's LDL goal (i.e. LDL goal of < 70 mg/dL, would have non-HDL goal of < 100 mg/dL)  TB Skin Test     Status: Normal   Collection Time: 11/24/16  8:27 AM  Result Value Ref Range   TB Skin Test Negative    Induration 0 mm       Follow up: Return if symptoms worsen or fail to improve.  [name], NP

## 2016-11-21 NOTE — Patient Instructions (Addendum)
Follow up with GYN for PAP smear and breast exam.  Resolved UTI per repeat urinalysis.  Normal TSH.  Lipid panel indicates need to weight loss through heart healthy diet and regular exercise.  Negative for flu and strep.  Return to office for influenza vaccine in 1-2weeks.  Return to office in 2days for TB skin test to be read. Form will be completed once skin test results is documented. Bring previous immunization record if you have it.  URI Instructions: Flonase and Afrin use: apply 1spray of afrin in each nare, wait 34mns, then apply 2sprays of flonase in each nare. Use both nasal spray consecutively x 3days, then flonase only for at least 14days.  Encourage adequate oral hydration.  Use over-the-counter  "cold" medicines  such as "Tylenol cold" , "Advil cold",  "Mucinex" or" Mucinex D"  for cough and congestion.  Avoid decongestants if you have high blood pressure. Use" Delsym" or" Robitussin" cough syrup varietis for cough.  You can use plain "Tylenol" or "Advi"l for fever, chills and achyness.   "Common cold" symptoms are usually triggered by a virus.  The antibiotics are usually not necessary. On average, a" viral cold" illness would take 4-7 days to resolve. Please, make an appointment if you are not better or if you're worse.   Health Maintenance, Female Adopting a healthy lifestyle and getting preventive care can go a long way to promote health and wellness. Talk with your health care provider about what schedule of regular examinations is right for you. This is a good chance for you to check in with your provider about disease prevention and staying healthy. In between checkups, there are plenty of things you can do on your own. Experts have done a lot of research about which lifestyle changes and preventive measures are most likely to keep you healthy. Ask your health care provider for more information. Weight and diet Eat a healthy diet  Be sure to include plenty of  vegetables, fruits, low-fat dairy products, and lean protein.  Do not eat a lot of foods high in solid fats, added sugars, or salt.  Get regular exercise. This is one of the most important things you can do for your health. ? Most adults should exercise for at least 150 minutes each week. The exercise should increase your heart rate and make you sweat (moderate-intensity exercise). ? Most adults should also do strengthening exercises at least twice a week. This is in addition to the moderate-intensity exercise.  Maintain a healthy weight  Body mass index (BMI) is a measurement that can be used to identify possible weight problems. It estimates body fat based on height and weight. Your health care provider can help determine your BMI and help you achieve or maintain a healthy weight.  For females 266years of age and older: ? A BMI below 18.5 is considered underweight. ? A BMI of 18.5 to 24.9 is normal. ? A BMI of 25 to 29.9 is considered overweight. ? A BMI of 30 and above is considered obese.  Watch levels of cholesterol and blood lipids  You should start having your blood tested for lipids and cholesterol at 42years of age, then have this test every 5 years.  You may need to have your cholesterol levels checked more often if: ? Your lipid or cholesterol levels are high. ? You are older than 42years of age. ? You are at high risk for heart disease.  Cancer screening Lung Cancer  Lung cancer screening  is recommended for adults 16-37 years old who are at high risk for lung cancer because of a history of smoking.  A yearly low-dose CT scan of the lungs is recommended for people who: ? Currently smoke. ? Have quit within the past 15 years. ? Have at least a 30-pack-year history of smoking. A pack year is smoking an average of one pack of cigarettes a day for 1 year.  Yearly screening should continue until it has been 15 years since you quit.  Yearly screening should stop if you  develop a health problem that would prevent you from having lung cancer treatment.  Breast Cancer  Practice breast self-awareness. This means understanding how your breasts normally appear and feel.  It also means doing regular breast self-exams. Let your health care provider know about any changes, no matter how small.  If you are in your 20s or 30s, you should have a clinical breast exam (CBE) by a health care provider every 1-3 years as part of a regular health exam.  If you are 47 or older, have a CBE every year. Also consider having a breast X-ray (mammogram) every year.  If you have a family history of breast cancer, talk to your health care provider about genetic screening.  If you are at high risk for breast cancer, talk to your health care provider about having an MRI and a mammogram every year.  Breast cancer gene (BRCA) assessment is recommended for women who have family members with BRCA-related cancers. BRCA-related cancers include: ? Breast. ? Ovarian. ? Tubal. ? Peritoneal cancers.  Results of the assessment will determine the need for genetic counseling and BRCA1 and BRCA2 testing.  Cervical Cancer Your health care provider may recommend that you be screened regularly for cancer of the pelvic organs (ovaries, uterus, and vagina). This screening involves a pelvic examination, including checking for microscopic changes to the surface of your cervix (Pap test). You may be encouraged to have this screening done every 3 years, beginning at age 8.  For women ages 20-65, health care providers may recommend pelvic exams and Pap testing every 3 years, or they may recommend the Pap and pelvic exam, combined with testing for human papilloma virus (HPV), every 5 years. Some types of HPV increase your risk of cervical cancer. Testing for HPV may also be done on women of any age with unclear Pap test results.  Other health care providers may not recommend any screening for nonpregnant  women who are considered low risk for pelvic cancer and who do not have symptoms. Ask your health care provider if a screening pelvic exam is right for you.  If you have had past treatment for cervical cancer or a condition that could lead to cancer, you need Pap tests and screening for cancer for at least 20 years after your treatment. If Pap tests have been discontinued, your risk factors (such as having a new sexual partner) need to be reassessed to determine if screening should resume. Some women have medical problems that increase the chance of getting cervical cancer. In these cases, your health care provider may recommend more frequent screening and Pap tests.  Colorectal Cancer  This type of cancer can be detected and often prevented.  Routine colorectal cancer screening usually begins at 42 years of age and continues through 42 years of age.  Your health care provider may recommend screening at an earlier age if you have risk factors for colon cancer.  Your health care provider  may also recommend using home test kits to check for hidden blood in the stool.  A small camera at the end of a tube can be used to examine your colon directly (sigmoidoscopy or colonoscopy). This is done to check for the earliest forms of colorectal cancer.  Routine screening usually begins at age 14.  Direct examination of the colon should be repeated every 5-10 years through 42 years of age. However, you may need to be screened more often if early forms of precancerous polyps or small growths are found.  Skin Cancer  Check your skin from head to toe regularly.  Tell your health care provider about any new moles or changes in moles, especially if there is a change in a mole's shape or color.  Also tell your health care provider if you have a mole that is larger than the size of a pencil eraser.  Always use sunscreen. Apply sunscreen liberally and repeatedly throughout the day.  Protect yourself by  wearing long sleeves, pants, a wide-brimmed hat, and sunglasses whenever you are outside.  Heart disease, diabetes, and high blood pressure  High blood pressure causes heart disease and increases the risk of stroke. High blood pressure is more likely to develop in: ? People who have blood pressure in the high end of the normal range (130-139/85-89 mm Hg). ? People who are overweight or obese. ? People who are African American.  If you are 53-63 years of age, have your blood pressure checked every 3-5 years. If you are 56 years of age or older, have your blood pressure checked every year. You should have your blood pressure measured twice-once when you are at a hospital or clinic, and once when you are not at a hospital or clinic. Record the average of the two measurements. To check your blood pressure when you are not at a hospital or clinic, you can use: ? An automated blood pressure machine at a pharmacy. ? A home blood pressure monitor.  If you are between 74 years and 73 years old, ask your health care provider if you should take aspirin to prevent strokes.  Have regular diabetes screenings. This involves taking a blood sample to check your fasting blood sugar level. ? If you are at a normal weight and have a low risk for diabetes, have this test once every three years after 42 years of age. ? If you are overweight and have a high risk for diabetes, consider being tested at a younger age or more often. Preventing infection Hepatitis B  If you have a higher risk for hepatitis B, you should be screened for this virus. You are considered at high risk for hepatitis B if: ? You were born in a country where hepatitis B is common. Ask your health care provider which countries are considered high risk. ? Your parents were born in a high-risk country, and you have not been immunized against hepatitis B (hepatitis B vaccine). ? You have HIV or AIDS. ? You use needles to inject street drugs. ? You  live with someone who has hepatitis B. ? You have had sex with someone who has hepatitis B. ? You get hemodialysis treatment. ? You take certain medicines for conditions, including cancer, organ transplantation, and autoimmune conditions.  Hepatitis C  Blood testing is recommended for: ? Everyone born from [birth year] through 1966. ? Anyone with known risk factors for hepatitis C.  Sexually transmitted infections (STIs)  You should be screened for sexually transmitted infections (  STIs) including gonorrhea and chlamydia if: ? You are sexually active and are younger than 42 years of age. ? You are older than 42 years of age and your health care provider tells you that you are at risk for this type of infection. ? Your sexual activity has changed since you were last screened and you are at an increased risk for chlamydia or gonorrhea. Ask your health care provider if you are at risk.  If you do not have HIV, but are at risk, it may be recommended that you take a prescription medicine daily to prevent HIV infection. This is called pre-exposure prophylaxis (PrEP). You are considered at risk if: ? You are sexually active and do not regularly use condoms or know the HIV status of your partner(s). ? You take drugs by injection. ? You are sexually active with a partner who has HIV.  Talk with your health care provider about whether you are at high risk of being infected with HIV. If you choose to begin PrEP, you should first be tested for HIV. You should then be tested every 3 months for as long as you are taking PrEP. Pregnancy  If you are premenopausal and you may become pregnant, ask your health care provider about preconception counseling.  If you may become pregnant, take 400 to 800 micrograms (mcg) of folic acid every day.  If you want to prevent pregnancy, talk to your health care provider about birth control (contraception). Osteoporosis and menopause  Osteoporosis is a disease in which the  bones lose minerals and strength with aging. This can result in serious bone fractures. Your risk for osteoporosis can be identified using a bone density scan.  If you are 69 years of age or older, or if you are at risk for osteoporosis and fractures, ask your health care provider if you should be screened.  Ask your health care provider whether you should take a calcium or vitamin D supplement to lower your risk for osteoporosis.  Menopause may have certain physical symptoms and risks.  Hormone replacement therapy may reduce some of these symptoms and risks. Talk to your health care provider about whether hormone replacement therapy is right for you. Follow these instructions at home:  Schedule regular health, dental, and eye exams.  Stay current with your immunizations.  Do not use any tobacco products including cigarettes, chewing tobacco, or electronic cigarettes.  If you are pregnant, do not drink alcohol.  If you are breastfeeding, limit how much and how often you drink alcohol.  Limit alcohol intake to no more than 1 drink per day for nonpregnant women. One drink equals 12 ounces of beer, 5 ounces of wine, or 1 ounces of hard liquor.  Do not use street drugs.  Do not share needles.  Ask your health care provider for help if you need support or information about quitting drugs.  Tell your health care provider if you often feel depressed.  Tell your health care provider if you have ever been abused or do not feel safe at home. This information is not intended to replace advice given to you by your health care provider. Make sure you discuss any questions you have with your health care provider. Document Released: 07/25/2010 Document Revised: 06/17/2015 Document Reviewed: 10/13/2014 [employer] Interactive Patient Education  [employer].

## 2016-11-22 ENCOUNTER — Other Ambulatory Visit: Payer: Self-pay | Admitting: Nurse Practitioner

## 2016-11-22 MED ORDER — ALBUTEROL SULFATE HFA 108 (90 BASE) MCG/ACT IN AERS: 1.0000 | INHALATION_SPRAY | Freq: Four times a day (QID) | RESPIRATORY_TRACT | 0 refills | Status: AC | PRN

## 2016-11-24 LAB — TB SKIN TEST
INDURATION: 0 mm
TB SKIN TEST: NEGATIVE

## 2016-11-27 ENCOUNTER — Encounter: Payer: Self-pay | Admitting: Nurse Practitioner

## 2016-11-30 ENCOUNTER — Ambulatory Visit: Payer: Self-pay | Admitting: Sports Medicine

## 2016-12-05 ENCOUNTER — Ambulatory Visit: Payer: Self-pay

## 2016-12-07 ENCOUNTER — Ambulatory Visit: Payer: Self-pay | Admitting: Sports Medicine

## 2016-12-07 DIAGNOSIS — F411 Generalized anxiety disorder: Secondary | ICD-10-CM | POA: Diagnosis not present

## 2016-12-07 DIAGNOSIS — L668 Other cicatricial alopecia: Secondary | ICD-10-CM | POA: Diagnosis not present

## 2016-12-07 DIAGNOSIS — L658 Other specified nonscarring hair loss: Secondary | ICD-10-CM | POA: Diagnosis not present

## 2016-12-07 DIAGNOSIS — L700 Acne vulgaris: Secondary | ICD-10-CM | POA: Diagnosis not present

## 2016-12-08 ENCOUNTER — Ambulatory Visit: Payer: Federal, State, Local not specified - PPO | Admitting: Sports Medicine

## 2016-12-29 ENCOUNTER — Other Ambulatory Visit: Payer: Self-pay | Admitting: Nurse Practitioner

## 2016-12-29 DIAGNOSIS — J069 Acute upper respiratory infection, unspecified: Secondary | ICD-10-CM

## 2017-01-25 DIAGNOSIS — R1031 Right lower quadrant pain: Secondary | ICD-10-CM | POA: Diagnosis not present

## 2017-01-25 DIAGNOSIS — Z1231 Encounter for screening mammogram for malignant neoplasm of breast: Secondary | ICD-10-CM | POA: Diagnosis not present

## 2017-01-25 DIAGNOSIS — Z01419 Encounter for gynecological examination (general) (routine) without abnormal findings: Secondary | ICD-10-CM | POA: Diagnosis not present

## 2017-01-25 DIAGNOSIS — Z6838 Body mass index (BMI) 38.0-38.9, adult: Secondary | ICD-10-CM | POA: Diagnosis not present

## 2017-01-31 ENCOUNTER — Other Ambulatory Visit: Payer: Self-pay | Admitting: Obstetrics and Gynecology

## 2017-01-31 DIAGNOSIS — R1031 Right lower quadrant pain: Secondary | ICD-10-CM

## 2017-02-01 DIAGNOSIS — I10 Essential (primary) hypertension: Secondary | ICD-10-CM | POA: Diagnosis not present

## 2017-02-01 DIAGNOSIS — R51 Headache: Secondary | ICD-10-CM | POA: Diagnosis not present

## 2017-02-01 DIAGNOSIS — R635 Abnormal weight gain: Secondary | ICD-10-CM | POA: Diagnosis not present

## 2017-02-08 DIAGNOSIS — E669 Obesity, unspecified: Secondary | ICD-10-CM | POA: Diagnosis not present

## 2017-02-08 DIAGNOSIS — I10 Essential (primary) hypertension: Secondary | ICD-10-CM | POA: Diagnosis not present

## 2017-02-08 DIAGNOSIS — Z713 Dietary counseling and surveillance: Secondary | ICD-10-CM | POA: Diagnosis not present

## 2017-02-16 ENCOUNTER — Encounter: Payer: Self-pay | Admitting: Sports Medicine

## 2017-02-16 DIAGNOSIS — Z723 Lack of physical exercise: Secondary | ICD-10-CM | POA: Diagnosis not present

## 2017-02-16 DIAGNOSIS — Z713 Dietary counseling and surveillance: Secondary | ICD-10-CM | POA: Diagnosis not present

## 2017-02-16 DIAGNOSIS — I10 Essential (primary) hypertension: Secondary | ICD-10-CM | POA: Diagnosis not present

## 2017-02-23 DIAGNOSIS — E669 Obesity, unspecified: Secondary | ICD-10-CM | POA: Diagnosis not present

## 2017-02-23 DIAGNOSIS — Z723 Lack of physical exercise: Secondary | ICD-10-CM | POA: Diagnosis not present

## 2017-02-23 DIAGNOSIS — Z713 Dietary counseling and surveillance: Secondary | ICD-10-CM | POA: Diagnosis not present

## 2017-02-27 ENCOUNTER — Ambulatory Visit
Admission: RE | Admit: 2017-02-27 | Discharge: 2017-02-27 | Disposition: A | Discharge status: Home / Self Care | Payer: Federal, State, Local not specified - PPO | Source: Ambulatory Visit | Attending: Obstetrics and Gynecology | Admitting: Obstetrics and Gynecology

## 2017-02-27 DIAGNOSIS — K5900 Constipation, unspecified: Secondary | ICD-10-CM | POA: Diagnosis not present

## 2017-02-27 DIAGNOSIS — R1031 Right lower quadrant pain: Secondary | ICD-10-CM

## 2017-02-27 MED ORDER — IOPAMIDOL (ISOVUE-300) INJECTION 61%
100.0000 mL | Freq: Once | INTRAVENOUS | Status: AC | PRN
  Administered 2017-02-27: 100 mL via INTRAVENOUS

## 2017-03-02 DIAGNOSIS — Z713 Dietary counseling and surveillance: Secondary | ICD-10-CM | POA: Diagnosis not present

## 2017-03-02 DIAGNOSIS — Z723 Lack of physical exercise: Secondary | ICD-10-CM | POA: Diagnosis not present

## 2017-03-02 DIAGNOSIS — I10 Essential (primary) hypertension: Secondary | ICD-10-CM | POA: Diagnosis not present

## 2017-03-02 DIAGNOSIS — E669 Obesity, unspecified: Secondary | ICD-10-CM | POA: Diagnosis not present

## 2017-03-09 ENCOUNTER — Ambulatory Visit: Payer: Federal, State, Local not specified - PPO | Admitting: Sports Medicine

## 2017-03-09 DIAGNOSIS — L668 Other cicatricial alopecia: Secondary | ICD-10-CM | POA: Diagnosis not present

## 2017-03-29 DIAGNOSIS — F411 Generalized anxiety disorder: Secondary | ICD-10-CM | POA: Diagnosis not present

## 2017-04-19 DIAGNOSIS — Z713 Dietary counseling and surveillance: Secondary | ICD-10-CM | POA: Diagnosis not present

## 2017-04-19 DIAGNOSIS — Z723 Lack of physical exercise: Secondary | ICD-10-CM | POA: Diagnosis not present

## 2017-04-19 DIAGNOSIS — I10 Essential (primary) hypertension: Secondary | ICD-10-CM | POA: Diagnosis not present

## 2017-04-25 DIAGNOSIS — Z713 Dietary counseling and surveillance: Secondary | ICD-10-CM | POA: Diagnosis not present

## 2017-04-25 DIAGNOSIS — Z723 Lack of physical exercise: Secondary | ICD-10-CM | POA: Diagnosis not present

## 2017-04-25 DIAGNOSIS — I10 Essential (primary) hypertension: Secondary | ICD-10-CM | POA: Diagnosis not present

## 2017-04-26 DIAGNOSIS — R102 Pelvic and perineal pain: Secondary | ICD-10-CM | POA: Diagnosis not present

## 2017-04-26 DIAGNOSIS — R1031 Right lower quadrant pain: Secondary | ICD-10-CM | POA: Diagnosis not present

## 2017-04-26 DIAGNOSIS — N898 Other specified noninflammatory disorders of vagina: Secondary | ICD-10-CM | POA: Diagnosis not present

## 2017-04-26 DIAGNOSIS — F411 Generalized anxiety disorder: Secondary | ICD-10-CM | POA: Diagnosis not present

## 2017-04-26 DIAGNOSIS — F331 Major depressive disorder, recurrent, moderate: Secondary | ICD-10-CM | POA: Diagnosis not present

## 2017-04-26 DIAGNOSIS — F4310 Post-traumatic stress disorder, unspecified: Secondary | ICD-10-CM | POA: Diagnosis not present

## 2017-04-27 DIAGNOSIS — R1031 Right lower quadrant pain: Secondary | ICD-10-CM | POA: Diagnosis not present

## 2017-04-29 DIAGNOSIS — N390 Urinary tract infection, site not specified: Secondary | ICD-10-CM | POA: Diagnosis not present

## 2017-04-29 DIAGNOSIS — R109 Unspecified abdominal pain: Secondary | ICD-10-CM | POA: Diagnosis not present

## 2017-04-29 DIAGNOSIS — N83202 Unspecified ovarian cyst, left side: Secondary | ICD-10-CM | POA: Diagnosis not present

## 2017-04-29 DIAGNOSIS — R102 Pelvic and perineal pain: Secondary | ICD-10-CM | POA: Diagnosis not present

## 2017-04-29 DIAGNOSIS — R110 Nausea: Secondary | ICD-10-CM | POA: Diagnosis not present

## 2017-04-29 DIAGNOSIS — N83201 Unspecified ovarian cyst, right side: Secondary | ICD-10-CM | POA: Diagnosis not present

## 2017-05-02 DIAGNOSIS — N941 Unspecified dyspareunia: Secondary | ICD-10-CM | POA: Diagnosis not present

## 2017-05-02 DIAGNOSIS — N83202 Unspecified ovarian cyst, left side: Secondary | ICD-10-CM | POA: Diagnosis not present

## 2017-05-02 DIAGNOSIS — R1032 Left lower quadrant pain: Secondary | ICD-10-CM | POA: Diagnosis not present

## 2017-05-02 DIAGNOSIS — Z90710 Acquired absence of both cervix and uterus: Secondary | ICD-10-CM | POA: Diagnosis not present

## 2017-05-05 DIAGNOSIS — Z723 Lack of physical exercise: Secondary | ICD-10-CM | POA: Diagnosis not present

## 2017-05-05 DIAGNOSIS — I10 Essential (primary) hypertension: Secondary | ICD-10-CM | POA: Diagnosis not present

## 2017-05-05 DIAGNOSIS — Z713 Dietary counseling and surveillance: Secondary | ICD-10-CM | POA: Diagnosis not present

## 2017-05-11 DIAGNOSIS — F331 Major depressive disorder, recurrent, moderate: Secondary | ICD-10-CM | POA: Diagnosis not present

## 2017-05-11 DIAGNOSIS — F411 Generalized anxiety disorder: Secondary | ICD-10-CM | POA: Diagnosis not present

## 2017-05-11 DIAGNOSIS — F4310 Post-traumatic stress disorder, unspecified: Secondary | ICD-10-CM | POA: Diagnosis not present

## 2017-05-12 DIAGNOSIS — I10 Essential (primary) hypertension: Secondary | ICD-10-CM | POA: Diagnosis not present

## 2017-05-12 DIAGNOSIS — E669 Obesity, unspecified: Secondary | ICD-10-CM | POA: Diagnosis not present

## 2017-05-12 DIAGNOSIS — Z713 Dietary counseling and surveillance: Secondary | ICD-10-CM | POA: Diagnosis not present

## 2017-05-19 DIAGNOSIS — Z713 Dietary counseling and surveillance: Secondary | ICD-10-CM | POA: Diagnosis not present

## 2017-05-19 DIAGNOSIS — I10 Essential (primary) hypertension: Secondary | ICD-10-CM | POA: Diagnosis not present

## 2017-05-19 DIAGNOSIS — Z723 Lack of physical exercise: Secondary | ICD-10-CM | POA: Diagnosis not present

## 2017-05-21 DIAGNOSIS — Z5321 Procedure and treatment not carried out due to patient leaving prior to being seen by health care provider: Secondary | ICD-10-CM | POA: Diagnosis not present

## 2017-05-21 DIAGNOSIS — R079 Chest pain, unspecified: Secondary | ICD-10-CM | POA: Diagnosis not present

## 2017-05-24 DIAGNOSIS — Z113 Encounter for screening for infections with a predominantly sexual mode of transmission: Secondary | ICD-10-CM | POA: Diagnosis not present

## 2017-05-26 DIAGNOSIS — I10 Essential (primary) hypertension: Secondary | ICD-10-CM | POA: Diagnosis not present

## 2017-05-26 DIAGNOSIS — Z723 Lack of physical exercise: Secondary | ICD-10-CM | POA: Diagnosis not present

## 2017-05-26 DIAGNOSIS — Z713 Dietary counseling and surveillance: Secondary | ICD-10-CM | POA: Diagnosis not present

## 2017-05-31 DIAGNOSIS — Z6835 Body mass index (BMI) 35.0-35.9, adult: Secondary | ICD-10-CM | POA: Diagnosis not present

## 2017-05-31 DIAGNOSIS — F419 Anxiety disorder, unspecified: Secondary | ICD-10-CM | POA: Diagnosis not present

## 2017-05-31 DIAGNOSIS — F329 Major depressive disorder, single episode, unspecified: Secondary | ICD-10-CM | POA: Diagnosis not present

## 2017-06-02 DIAGNOSIS — I10 Essential (primary) hypertension: Secondary | ICD-10-CM | POA: Diagnosis not present

## 2017-06-02 DIAGNOSIS — Z723 Lack of physical exercise: Secondary | ICD-10-CM | POA: Diagnosis not present

## 2017-06-02 DIAGNOSIS — Z713 Dietary counseling and surveillance: Secondary | ICD-10-CM | POA: Diagnosis not present

## 2017-06-02 DIAGNOSIS — E669 Obesity, unspecified: Secondary | ICD-10-CM | POA: Diagnosis not present

## 2017-06-05 DIAGNOSIS — F331 Major depressive disorder, recurrent, moderate: Secondary | ICD-10-CM | POA: Diagnosis not present

## 2017-06-05 DIAGNOSIS — F4310 Post-traumatic stress disorder, unspecified: Secondary | ICD-10-CM | POA: Diagnosis not present

## 2017-06-05 DIAGNOSIS — F411 Generalized anxiety disorder: Secondary | ICD-10-CM | POA: Diagnosis not present

## 2017-06-12 DIAGNOSIS — F331 Major depressive disorder, recurrent, moderate: Secondary | ICD-10-CM | POA: Diagnosis not present

## 2017-06-12 DIAGNOSIS — F4310 Post-traumatic stress disorder, unspecified: Secondary | ICD-10-CM | POA: Diagnosis not present

## 2017-06-12 DIAGNOSIS — F411 Generalized anxiety disorder: Secondary | ICD-10-CM | POA: Diagnosis not present

## 2017-06-29 DIAGNOSIS — F4310 Post-traumatic stress disorder, unspecified: Secondary | ICD-10-CM | POA: Diagnosis not present

## 2017-06-29 DIAGNOSIS — F411 Generalized anxiety disorder: Secondary | ICD-10-CM | POA: Diagnosis not present

## 2017-06-29 DIAGNOSIS — F331 Major depressive disorder, recurrent, moderate: Secondary | ICD-10-CM | POA: Diagnosis not present

## 2017-07-02 DIAGNOSIS — F419 Anxiety disorder, unspecified: Secondary | ICD-10-CM | POA: Diagnosis not present

## 2017-07-02 DIAGNOSIS — M542 Cervicalgia: Secondary | ICD-10-CM | POA: Diagnosis not present

## 2017-07-02 DIAGNOSIS — F329 Major depressive disorder, single episode, unspecified: Secondary | ICD-10-CM | POA: Diagnosis not present

## 2017-07-02 DIAGNOSIS — M545 Low back pain: Secondary | ICD-10-CM | POA: Diagnosis not present

## 2017-07-04 DIAGNOSIS — F4310 Post-traumatic stress disorder, unspecified: Secondary | ICD-10-CM | POA: Diagnosis not present

## 2017-07-04 DIAGNOSIS — F331 Major depressive disorder, recurrent, moderate: Secondary | ICD-10-CM | POA: Diagnosis not present

## 2017-07-04 DIAGNOSIS — F411 Generalized anxiety disorder: Secondary | ICD-10-CM | POA: Diagnosis not present

## 2017-07-06 DIAGNOSIS — F4323 Adjustment disorder with mixed anxiety and depressed mood: Secondary | ICD-10-CM | POA: Diagnosis not present

## 2017-07-10 DIAGNOSIS — M47816 Spondylosis without myelopathy or radiculopathy, lumbar region: Secondary | ICD-10-CM | POA: Diagnosis not present

## 2017-07-10 DIAGNOSIS — M5136 Other intervertebral disc degeneration, lumbar region: Secondary | ICD-10-CM | POA: Diagnosis not present

## 2017-07-10 DIAGNOSIS — M50322 Other cervical disc degeneration at C5-C6 level: Secondary | ICD-10-CM | POA: Diagnosis not present

## 2017-07-10 DIAGNOSIS — M542 Cervicalgia: Secondary | ICD-10-CM | POA: Diagnosis not present

## 2017-07-10 DIAGNOSIS — M4012 Other secondary kyphosis, cervical region: Secondary | ICD-10-CM | POA: Diagnosis not present

## 2017-07-10 DIAGNOSIS — M47812 Spondylosis without myelopathy or radiculopathy, cervical region: Secondary | ICD-10-CM | POA: Diagnosis not present

## 2017-07-10 DIAGNOSIS — M5412 Radiculopathy, cervical region: Secondary | ICD-10-CM | POA: Diagnosis not present

## 2017-07-12 DIAGNOSIS — F4310 Post-traumatic stress disorder, unspecified: Secondary | ICD-10-CM | POA: Diagnosis not present

## 2017-07-12 DIAGNOSIS — F411 Generalized anxiety disorder: Secondary | ICD-10-CM | POA: Diagnosis not present

## 2017-07-12 DIAGNOSIS — F331 Major depressive disorder, recurrent, moderate: Secondary | ICD-10-CM | POA: Diagnosis not present

## 2017-07-13 DIAGNOSIS — F4323 Adjustment disorder with mixed anxiety and depressed mood: Secondary | ICD-10-CM | POA: Diagnosis not present

## 2017-07-17 DIAGNOSIS — F411 Generalized anxiety disorder: Secondary | ICD-10-CM | POA: Diagnosis not present

## 2017-07-17 DIAGNOSIS — F331 Major depressive disorder, recurrent, moderate: Secondary | ICD-10-CM | POA: Diagnosis not present

## 2017-07-17 DIAGNOSIS — F4310 Post-traumatic stress disorder, unspecified: Secondary | ICD-10-CM | POA: Diagnosis not present

## 2017-07-19 DIAGNOSIS — R1031 Right lower quadrant pain: Secondary | ICD-10-CM | POA: Diagnosis not present

## 2017-07-19 DIAGNOSIS — F419 Anxiety disorder, unspecified: Secondary | ICD-10-CM | POA: Diagnosis not present

## 2017-07-19 DIAGNOSIS — K529 Noninfective gastroenteritis and colitis, unspecified: Secondary | ICD-10-CM | POA: Diagnosis not present

## 2017-07-19 DIAGNOSIS — N281 Cyst of kidney, acquired: Secondary | ICD-10-CM | POA: Diagnosis not present

## 2017-07-19 DIAGNOSIS — R1032 Left lower quadrant pain: Secondary | ICD-10-CM | POA: Diagnosis not present

## 2017-07-19 DIAGNOSIS — K6389 Other specified diseases of intestine: Secondary | ICD-10-CM | POA: Diagnosis not present

## 2017-07-19 DIAGNOSIS — R1111 Vomiting without nausea: Secondary | ICD-10-CM | POA: Diagnosis not present

## 2017-07-19 DIAGNOSIS — K921 Melena: Secondary | ICD-10-CM | POA: Diagnosis not present

## 2017-07-19 DIAGNOSIS — Z79899 Other long term (current) drug therapy: Secondary | ICD-10-CM | POA: Diagnosis not present

## 2017-07-19 DIAGNOSIS — D1803 Hemangioma of intra-abdominal structures: Secondary | ICD-10-CM | POA: Diagnosis not present

## 2017-07-19 DIAGNOSIS — F329 Major depressive disorder, single episode, unspecified: Secondary | ICD-10-CM | POA: Diagnosis not present

## 2017-07-19 DIAGNOSIS — R45851 Suicidal ideations: Secondary | ICD-10-CM | POA: Diagnosis not present

## 2017-07-20 DIAGNOSIS — F4323 Adjustment disorder with mixed anxiety and depressed mood: Secondary | ICD-10-CM | POA: Diagnosis not present

## 2017-07-24 DIAGNOSIS — F411 Generalized anxiety disorder: Secondary | ICD-10-CM | POA: Diagnosis not present

## 2017-07-27 DIAGNOSIS — F411 Generalized anxiety disorder: Secondary | ICD-10-CM | POA: Diagnosis not present

## 2017-07-27 DIAGNOSIS — F331 Major depressive disorder, recurrent, moderate: Secondary | ICD-10-CM | POA: Diagnosis not present

## 2017-07-27 DIAGNOSIS — F4310 Post-traumatic stress disorder, unspecified: Secondary | ICD-10-CM | POA: Diagnosis not present

## 2017-07-27 DIAGNOSIS — F4323 Adjustment disorder with mixed anxiety and depressed mood: Secondary | ICD-10-CM | POA: Diagnosis not present

## 2017-07-31 ENCOUNTER — Encounter: Payer: Self-pay | Admitting: Nurse Practitioner

## 2017-07-31 DIAGNOSIS — F411 Generalized anxiety disorder: Secondary | ICD-10-CM | POA: Diagnosis not present

## 2017-07-31 DIAGNOSIS — F331 Major depressive disorder, recurrent, moderate: Secondary | ICD-10-CM | POA: Diagnosis not present

## 2017-07-31 DIAGNOSIS — F4310 Post-traumatic stress disorder, unspecified: Secondary | ICD-10-CM | POA: Diagnosis not present

## 2017-08-03 DIAGNOSIS — M5412 Radiculopathy, cervical region: Secondary | ICD-10-CM | POA: Diagnosis not present

## 2017-08-03 DIAGNOSIS — F4323 Adjustment disorder with mixed anxiety and depressed mood: Secondary | ICD-10-CM | POA: Diagnosis not present

## 2017-08-03 DIAGNOSIS — M47816 Spondylosis without myelopathy or radiculopathy, lumbar region: Secondary | ICD-10-CM | POA: Diagnosis not present

## 2017-08-03 DIAGNOSIS — M5136 Other intervertebral disc degeneration, lumbar region: Secondary | ICD-10-CM | POA: Diagnosis not present

## 2017-08-03 DIAGNOSIS — M47812 Spondylosis without myelopathy or radiculopathy, cervical region: Secondary | ICD-10-CM | POA: Diagnosis not present

## 2017-08-06 DIAGNOSIS — M542 Cervicalgia: Secondary | ICD-10-CM | POA: Diagnosis not present

## 2017-08-13 DIAGNOSIS — K529 Noninfective gastroenteritis and colitis, unspecified: Secondary | ICD-10-CM | POA: Diagnosis not present

## 2017-08-13 DIAGNOSIS — M545 Low back pain: Secondary | ICD-10-CM | POA: Diagnosis not present

## 2017-08-14 DIAGNOSIS — F411 Generalized anxiety disorder: Secondary | ICD-10-CM | POA: Diagnosis not present

## 2017-08-14 DIAGNOSIS — F4310 Post-traumatic stress disorder, unspecified: Secondary | ICD-10-CM | POA: Diagnosis not present

## 2017-08-14 DIAGNOSIS — F331 Major depressive disorder, recurrent, moderate: Secondary | ICD-10-CM | POA: Diagnosis not present

## 2017-08-25 DIAGNOSIS — I10 Essential (primary) hypertension: Secondary | ICD-10-CM | POA: Diagnosis not present

## 2017-08-25 DIAGNOSIS — Z713 Dietary counseling and surveillance: Secondary | ICD-10-CM | POA: Diagnosis not present

## 2017-08-25 DIAGNOSIS — E669 Obesity, unspecified: Secondary | ICD-10-CM | POA: Diagnosis not present

## 2017-08-27 DIAGNOSIS — F329 Major depressive disorder, single episode, unspecified: Secondary | ICD-10-CM | POA: Diagnosis not present

## 2017-08-27 DIAGNOSIS — F419 Anxiety disorder, unspecified: Secondary | ICD-10-CM | POA: Diagnosis not present

## 2017-08-27 DIAGNOSIS — R102 Pelvic and perineal pain: Secondary | ICD-10-CM | POA: Diagnosis not present

## 2017-08-27 DIAGNOSIS — K529 Noninfective gastroenteritis and colitis, unspecified: Secondary | ICD-10-CM | POA: Diagnosis not present

## 2017-08-28 DIAGNOSIS — F331 Major depressive disorder, recurrent, moderate: Secondary | ICD-10-CM | POA: Diagnosis not present

## 2017-08-28 DIAGNOSIS — F4310 Post-traumatic stress disorder, unspecified: Secondary | ICD-10-CM | POA: Diagnosis not present

## 2017-08-28 DIAGNOSIS — F411 Generalized anxiety disorder: Secondary | ICD-10-CM | POA: Diagnosis not present

## 2017-08-30 DIAGNOSIS — M545 Low back pain: Secondary | ICD-10-CM | POA: Diagnosis not present

## 2017-08-30 DIAGNOSIS — M542 Cervicalgia: Secondary | ICD-10-CM | POA: Diagnosis not present

## 2017-09-04 DIAGNOSIS — Z713 Dietary counseling and surveillance: Secondary | ICD-10-CM | POA: Diagnosis not present

## 2017-09-04 DIAGNOSIS — Z723 Lack of physical exercise: Secondary | ICD-10-CM | POA: Diagnosis not present

## 2017-09-04 DIAGNOSIS — E669 Obesity, unspecified: Secondary | ICD-10-CM | POA: Diagnosis not present

## 2017-09-04 DIAGNOSIS — I10 Essential (primary) hypertension: Secondary | ICD-10-CM | POA: Diagnosis not present

## 2017-09-06 DIAGNOSIS — M545 Low back pain: Secondary | ICD-10-CM | POA: Diagnosis not present

## 2017-09-06 DIAGNOSIS — M542 Cervicalgia: Secondary | ICD-10-CM | POA: Diagnosis not present

## 2017-09-12 DIAGNOSIS — Z630 Problems in relationship with spouse or partner: Secondary | ICD-10-CM | POA: Diagnosis not present

## 2017-09-12 DIAGNOSIS — Z90710 Acquired absence of both cervix and uterus: Secondary | ICD-10-CM | POA: Diagnosis not present

## 2017-09-12 DIAGNOSIS — N941 Unspecified dyspareunia: Secondary | ICD-10-CM | POA: Diagnosis not present

## 2017-09-12 DIAGNOSIS — R102 Pelvic and perineal pain: Secondary | ICD-10-CM | POA: Diagnosis not present

## 2017-09-14 DIAGNOSIS — M545 Low back pain: Secondary | ICD-10-CM | POA: Diagnosis not present

## 2017-09-14 DIAGNOSIS — M542 Cervicalgia: Secondary | ICD-10-CM | POA: Diagnosis not present

## 2017-09-15 DIAGNOSIS — Z713 Dietary counseling and surveillance: Secondary | ICD-10-CM | POA: Diagnosis not present

## 2017-09-15 DIAGNOSIS — E669 Obesity, unspecified: Secondary | ICD-10-CM | POA: Diagnosis not present

## 2017-09-15 DIAGNOSIS — I10 Essential (primary) hypertension: Secondary | ICD-10-CM | POA: Diagnosis not present

## 2017-09-18 DIAGNOSIS — F411 Generalized anxiety disorder: Secondary | ICD-10-CM | POA: Diagnosis not present

## 2017-09-18 DIAGNOSIS — F331 Major depressive disorder, recurrent, moderate: Secondary | ICD-10-CM | POA: Diagnosis not present

## 2017-09-18 DIAGNOSIS — M545 Low back pain: Secondary | ICD-10-CM | POA: Diagnosis not present

## 2017-09-18 DIAGNOSIS — F4310 Post-traumatic stress disorder, unspecified: Secondary | ICD-10-CM | POA: Diagnosis not present

## 2017-09-18 DIAGNOSIS — M4600 Spinal enthesopathy, site unspecified: Secondary | ICD-10-CM | POA: Diagnosis not present

## 2017-09-21 DIAGNOSIS — R001 Bradycardia, unspecified: Secondary | ICD-10-CM | POA: Diagnosis not present

## 2017-09-21 DIAGNOSIS — Z79899 Other long term (current) drug therapy: Secondary | ICD-10-CM | POA: Diagnosis not present

## 2017-09-21 DIAGNOSIS — R079 Chest pain, unspecified: Secondary | ICD-10-CM | POA: Diagnosis not present

## 2017-09-21 DIAGNOSIS — R0789 Other chest pain: Secondary | ICD-10-CM | POA: Diagnosis not present

## 2017-09-21 DIAGNOSIS — T426X1A Poisoning by other antiepileptic and sedative-hypnotic drugs, accidental (unintentional), initial encounter: Secondary | ICD-10-CM | POA: Diagnosis not present

## 2017-09-21 DIAGNOSIS — T4391XA Poisoning by unspecified psychotropic drug, accidental (unintentional), initial encounter: Secondary | ICD-10-CM | POA: Diagnosis not present

## 2017-09-22 DIAGNOSIS — I10 Essential (primary) hypertension: Secondary | ICD-10-CM | POA: Diagnosis not present

## 2017-09-22 DIAGNOSIS — Z713 Dietary counseling and surveillance: Secondary | ICD-10-CM | POA: Diagnosis not present

## 2017-09-22 DIAGNOSIS — E669 Obesity, unspecified: Secondary | ICD-10-CM | POA: Diagnosis not present

## 2017-09-25 DIAGNOSIS — F411 Generalized anxiety disorder: Secondary | ICD-10-CM | POA: Diagnosis not present

## 2017-09-25 DIAGNOSIS — F331 Major depressive disorder, recurrent, moderate: Secondary | ICD-10-CM | POA: Diagnosis not present

## 2017-09-25 DIAGNOSIS — F4310 Post-traumatic stress disorder, unspecified: Secondary | ICD-10-CM | POA: Diagnosis not present

## 2017-09-29 DIAGNOSIS — E669 Obesity, unspecified: Secondary | ICD-10-CM | POA: Diagnosis not present

## 2017-09-29 DIAGNOSIS — I10 Essential (primary) hypertension: Secondary | ICD-10-CM | POA: Diagnosis not present

## 2017-09-29 DIAGNOSIS — Z713 Dietary counseling and surveillance: Secondary | ICD-10-CM | POA: Diagnosis not present

## 2017-10-02 DIAGNOSIS — F4310 Post-traumatic stress disorder, unspecified: Secondary | ICD-10-CM | POA: Diagnosis not present

## 2017-10-02 DIAGNOSIS — F411 Generalized anxiety disorder: Secondary | ICD-10-CM | POA: Diagnosis not present

## 2017-10-02 DIAGNOSIS — F331 Major depressive disorder, recurrent, moderate: Secondary | ICD-10-CM | POA: Diagnosis not present

## 2017-10-04 DIAGNOSIS — M545 Low back pain: Secondary | ICD-10-CM | POA: Diagnosis not present

## 2017-10-11 DIAGNOSIS — L638 Other alopecia areata: Secondary | ICD-10-CM | POA: Diagnosis not present

## 2017-10-11 DIAGNOSIS — F411 Generalized anxiety disorder: Secondary | ICD-10-CM | POA: Diagnosis not present

## 2017-10-11 DIAGNOSIS — L658 Other specified nonscarring hair loss: Secondary | ICD-10-CM | POA: Diagnosis not present

## 2017-10-13 DIAGNOSIS — E669 Obesity, unspecified: Secondary | ICD-10-CM | POA: Diagnosis not present

## 2017-10-13 DIAGNOSIS — Z713 Dietary counseling and surveillance: Secondary | ICD-10-CM | POA: Diagnosis not present

## 2017-10-13 DIAGNOSIS — Z723 Lack of physical exercise: Secondary | ICD-10-CM | POA: Diagnosis not present

## 2017-10-13 DIAGNOSIS — I10 Essential (primary) hypertension: Secondary | ICD-10-CM | POA: Diagnosis not present

## 2017-10-24 DIAGNOSIS — M545 Low back pain: Secondary | ICD-10-CM | POA: Diagnosis not present

## 2017-11-06 DIAGNOSIS — M545 Low back pain: Secondary | ICD-10-CM | POA: Diagnosis not present

## 2017-11-15 DIAGNOSIS — M545 Low back pain: Secondary | ICD-10-CM | POA: Diagnosis not present

## 2017-11-20 DIAGNOSIS — M545 Low back pain: Secondary | ICD-10-CM | POA: Diagnosis not present

## 2017-11-22 DIAGNOSIS — M545 Low back pain: Secondary | ICD-10-CM | POA: Diagnosis not present

## 2017-11-27 DIAGNOSIS — R4701 Aphasia: Secondary | ICD-10-CM | POA: Diagnosis not present

## 2017-11-27 DIAGNOSIS — R200 Anesthesia of skin: Secondary | ICD-10-CM | POA: Diagnosis not present

## 2017-11-27 DIAGNOSIS — R51 Headache: Secondary | ICD-10-CM | POA: Diagnosis not present

## 2017-11-27 DIAGNOSIS — M6281 Muscle weakness (generalized): Secondary | ICD-10-CM | POA: Diagnosis not present

## 2017-11-27 DIAGNOSIS — R030 Elevated blood-pressure reading, without diagnosis of hypertension: Secondary | ICD-10-CM | POA: Diagnosis not present

## 2017-11-27 DIAGNOSIS — G43909 Migraine, unspecified, not intractable, without status migrainosus: Secondary | ICD-10-CM | POA: Diagnosis not present

## 2017-11-27 DIAGNOSIS — R531 Weakness: Secondary | ICD-10-CM | POA: Diagnosis not present

## 2017-11-27 DIAGNOSIS — R4182 Altered mental status, unspecified: Secondary | ICD-10-CM | POA: Diagnosis not present

## 2017-12-04 DIAGNOSIS — R200 Anesthesia of skin: Secondary | ICD-10-CM | POA: Diagnosis not present

## 2017-12-04 DIAGNOSIS — H538 Other visual disturbances: Secondary | ICD-10-CM | POA: Diagnosis not present

## 2017-12-04 DIAGNOSIS — M542 Cervicalgia: Secondary | ICD-10-CM | POA: Diagnosis not present

## 2017-12-04 DIAGNOSIS — G43111 Migraine with aura, intractable, with status migrainosus: Secondary | ICD-10-CM | POA: Diagnosis not present

## 2017-12-10 DIAGNOSIS — F331 Major depressive disorder, recurrent, moderate: Secondary | ICD-10-CM | POA: Diagnosis not present

## 2017-12-10 DIAGNOSIS — F411 Generalized anxiety disorder: Secondary | ICD-10-CM | POA: Diagnosis not present

## 2017-12-10 DIAGNOSIS — F4310 Post-traumatic stress disorder, unspecified: Secondary | ICD-10-CM | POA: Diagnosis not present

## 2017-12-12 DIAGNOSIS — R29818 Other symptoms and signs involving the nervous system: Secondary | ICD-10-CM | POA: Diagnosis not present

## 2017-12-31 DIAGNOSIS — R29810 Facial weakness: Secondary | ICD-10-CM | POA: Diagnosis not present

## 2017-12-31 DIAGNOSIS — R29818 Other symptoms and signs involving the nervous system: Secondary | ICD-10-CM | POA: Diagnosis not present

## 2017-12-31 DIAGNOSIS — R51 Headache: Secondary | ICD-10-CM | POA: Diagnosis not present

## 2018-01-08 DIAGNOSIS — E669 Obesity, unspecified: Secondary | ICD-10-CM | POA: Diagnosis not present

## 2018-01-08 DIAGNOSIS — D649 Anemia, unspecified: Secondary | ICD-10-CM | POA: Diagnosis not present

## 2018-01-08 DIAGNOSIS — Z723 Lack of physical exercise: Secondary | ICD-10-CM | POA: Diagnosis not present

## 2018-01-08 DIAGNOSIS — I10 Essential (primary) hypertension: Secondary | ICD-10-CM | POA: Diagnosis not present

## 2018-01-08 DIAGNOSIS — Z713 Dietary counseling and surveillance: Secondary | ICD-10-CM | POA: Diagnosis not present

## 2018-01-08 DIAGNOSIS — K5900 Constipation, unspecified: Secondary | ICD-10-CM | POA: Diagnosis not present

## 2018-01-09 DIAGNOSIS — F4310 Post-traumatic stress disorder, unspecified: Secondary | ICD-10-CM | POA: Diagnosis not present

## 2018-01-09 DIAGNOSIS — F411 Generalized anxiety disorder: Secondary | ICD-10-CM | POA: Diagnosis not present

## 2018-01-09 DIAGNOSIS — F331 Major depressive disorder, recurrent, moderate: Secondary | ICD-10-CM | POA: Diagnosis not present

## 2018-01-10 ENCOUNTER — Ambulatory Visit: Payer: Federal, State, Local not specified - PPO | Admitting: Physician Assistant

## 2018-01-17 DIAGNOSIS — M545 Low back pain: Secondary | ICD-10-CM | POA: Diagnosis not present

## 2018-01-17 DIAGNOSIS — M4600 Spinal enthesopathy, site unspecified: Secondary | ICD-10-CM | POA: Diagnosis not present

## 2018-01-19 DIAGNOSIS — Z713 Dietary counseling and surveillance: Secondary | ICD-10-CM | POA: Diagnosis not present

## 2018-01-19 DIAGNOSIS — E669 Obesity, unspecified: Secondary | ICD-10-CM | POA: Diagnosis not present

## 2018-01-19 DIAGNOSIS — I10 Essential (primary) hypertension: Secondary | ICD-10-CM | POA: Diagnosis not present

## 2018-01-19 DIAGNOSIS — Z723 Lack of physical exercise: Secondary | ICD-10-CM | POA: Diagnosis not present

## 2018-01-24 DIAGNOSIS — F411 Generalized anxiety disorder: Secondary | ICD-10-CM | POA: Diagnosis not present

## 2018-01-24 DIAGNOSIS — F331 Major depressive disorder, recurrent, moderate: Secondary | ICD-10-CM | POA: Diagnosis not present

## 2018-01-24 DIAGNOSIS — F4310 Post-traumatic stress disorder, unspecified: Secondary | ICD-10-CM | POA: Diagnosis not present

## 2018-02-02 DIAGNOSIS — Z723 Lack of physical exercise: Secondary | ICD-10-CM | POA: Diagnosis not present

## 2018-02-02 DIAGNOSIS — Z713 Dietary counseling and surveillance: Secondary | ICD-10-CM | POA: Diagnosis not present

## 2018-02-02 DIAGNOSIS — I10 Essential (primary) hypertension: Secondary | ICD-10-CM | POA: Diagnosis not present

## 2018-03-02 DIAGNOSIS — R0602 Shortness of breath: Secondary | ICD-10-CM | POA: Diagnosis not present

## 2018-03-02 DIAGNOSIS — M1990 Unspecified osteoarthritis, unspecified site: Secondary | ICD-10-CM | POA: Diagnosis not present

## 2018-03-02 DIAGNOSIS — I10 Essential (primary) hypertension: Secondary | ICD-10-CM | POA: Diagnosis not present

## 2018-03-02 DIAGNOSIS — K5900 Constipation, unspecified: Secondary | ICD-10-CM | POA: Diagnosis not present

## 2018-03-02 DIAGNOSIS — E669 Obesity, unspecified: Secondary | ICD-10-CM | POA: Diagnosis not present

## 2018-03-02 DIAGNOSIS — Z713 Dietary counseling and surveillance: Secondary | ICD-10-CM | POA: Diagnosis not present

## 2018-03-02 DIAGNOSIS — Z723 Lack of physical exercise: Secondary | ICD-10-CM | POA: Diagnosis not present

## 2018-03-04 DIAGNOSIS — Z01419 Encounter for gynecological examination (general) (routine) without abnormal findings: Secondary | ICD-10-CM | POA: Diagnosis not present

## 2018-03-04 DIAGNOSIS — Z90710 Acquired absence of both cervix and uterus: Secondary | ICD-10-CM | POA: Diagnosis not present

## 2018-03-04 DIAGNOSIS — Z6836 Body mass index (BMI) 36.0-36.9, adult: Secondary | ICD-10-CM | POA: Diagnosis not present

## 2018-03-07 DIAGNOSIS — L668 Other cicatricial alopecia: Secondary | ICD-10-CM | POA: Diagnosis not present

## 2018-03-07 DIAGNOSIS — L700 Acne vulgaris: Secondary | ICD-10-CM | POA: Diagnosis not present

## 2018-03-09 DIAGNOSIS — R0602 Shortness of breath: Secondary | ICD-10-CM | POA: Diagnosis not present

## 2018-03-09 DIAGNOSIS — M1990 Unspecified osteoarthritis, unspecified site: Secondary | ICD-10-CM | POA: Diagnosis not present

## 2018-03-09 DIAGNOSIS — Z723 Lack of physical exercise: Secondary | ICD-10-CM | POA: Diagnosis not present

## 2018-03-09 DIAGNOSIS — I10 Essential (primary) hypertension: Secondary | ICD-10-CM | POA: Diagnosis not present

## 2018-03-09 DIAGNOSIS — Z713 Dietary counseling and surveillance: Secondary | ICD-10-CM | POA: Diagnosis not present

## 2018-03-09 DIAGNOSIS — D649 Anemia, unspecified: Secondary | ICD-10-CM | POA: Diagnosis not present

## 2018-03-16 DIAGNOSIS — E669 Obesity, unspecified: Secondary | ICD-10-CM | POA: Diagnosis not present

## 2018-03-16 DIAGNOSIS — K5900 Constipation, unspecified: Secondary | ICD-10-CM | POA: Diagnosis not present

## 2018-03-16 DIAGNOSIS — M1990 Unspecified osteoarthritis, unspecified site: Secondary | ICD-10-CM | POA: Diagnosis not present

## 2018-03-16 DIAGNOSIS — Z713 Dietary counseling and surveillance: Secondary | ICD-10-CM | POA: Diagnosis not present

## 2018-03-16 DIAGNOSIS — I10 Essential (primary) hypertension: Secondary | ICD-10-CM | POA: Diagnosis not present

## 2018-03-16 DIAGNOSIS — R0602 Shortness of breath: Secondary | ICD-10-CM | POA: Diagnosis not present

## 2018-03-16 DIAGNOSIS — Z723 Lack of physical exercise: Secondary | ICD-10-CM | POA: Diagnosis not present

## 2018-03-26 DIAGNOSIS — R1031 Right lower quadrant pain: Secondary | ICD-10-CM | POA: Diagnosis not present

## 2018-03-26 DIAGNOSIS — M549 Dorsalgia, unspecified: Secondary | ICD-10-CM | POA: Diagnosis not present

## 2018-03-26 DIAGNOSIS — R109 Unspecified abdominal pain: Secondary | ICD-10-CM | POA: Diagnosis not present

## 2018-03-26 DIAGNOSIS — Z79899 Other long term (current) drug therapy: Secondary | ICD-10-CM | POA: Diagnosis not present

## 2018-03-27 DIAGNOSIS — F331 Major depressive disorder, recurrent, moderate: Secondary | ICD-10-CM | POA: Diagnosis not present

## 2018-03-27 DIAGNOSIS — F4310 Post-traumatic stress disorder, unspecified: Secondary | ICD-10-CM | POA: Diagnosis not present

## 2018-03-27 DIAGNOSIS — F411 Generalized anxiety disorder: Secondary | ICD-10-CM | POA: Diagnosis not present

## 2018-03-30 DIAGNOSIS — K5900 Constipation, unspecified: Secondary | ICD-10-CM | POA: Diagnosis not present

## 2018-03-30 DIAGNOSIS — E669 Obesity, unspecified: Secondary | ICD-10-CM | POA: Diagnosis not present

## 2018-03-30 DIAGNOSIS — M1990 Unspecified osteoarthritis, unspecified site: Secondary | ICD-10-CM | POA: Diagnosis not present

## 2018-03-30 DIAGNOSIS — Z713 Dietary counseling and surveillance: Secondary | ICD-10-CM | POA: Diagnosis not present

## 2018-03-30 DIAGNOSIS — D649 Anemia, unspecified: Secondary | ICD-10-CM | POA: Diagnosis not present

## 2018-03-30 DIAGNOSIS — I10 Essential (primary) hypertension: Secondary | ICD-10-CM | POA: Diagnosis not present

## 2018-04-06 DIAGNOSIS — I10 Essential (primary) hypertension: Secondary | ICD-10-CM | POA: Diagnosis not present

## 2018-04-06 DIAGNOSIS — K5900 Constipation, unspecified: Secondary | ICD-10-CM | POA: Diagnosis not present

## 2018-04-06 DIAGNOSIS — E669 Obesity, unspecified: Secondary | ICD-10-CM | POA: Diagnosis not present

## 2018-04-06 DIAGNOSIS — Z713 Dietary counseling and surveillance: Secondary | ICD-10-CM | POA: Diagnosis not present

## 2018-04-06 DIAGNOSIS — M1990 Unspecified osteoarthritis, unspecified site: Secondary | ICD-10-CM | POA: Diagnosis not present

## 2018-04-06 DIAGNOSIS — D649 Anemia, unspecified: Secondary | ICD-10-CM | POA: Diagnosis not present

## 2018-04-06 DIAGNOSIS — Z723 Lack of physical exercise: Secondary | ICD-10-CM | POA: Diagnosis not present

## 2018-04-10 DIAGNOSIS — F419 Anxiety disorder, unspecified: Secondary | ICD-10-CM | POA: Diagnosis not present

## 2018-04-10 DIAGNOSIS — F4310 Post-traumatic stress disorder, unspecified: Secondary | ICD-10-CM | POA: Diagnosis not present

## 2018-04-10 DIAGNOSIS — F329 Major depressive disorder, single episode, unspecified: Secondary | ICD-10-CM | POA: Diagnosis not present

## 2018-04-10 DIAGNOSIS — N898 Other specified noninflammatory disorders of vagina: Secondary | ICD-10-CM | POA: Diagnosis not present

## 2018-04-10 DIAGNOSIS — F331 Major depressive disorder, recurrent, moderate: Secondary | ICD-10-CM | POA: Diagnosis not present

## 2018-04-10 DIAGNOSIS — F411 Generalized anxiety disorder: Secondary | ICD-10-CM | POA: Diagnosis not present

## 2018-04-13 DIAGNOSIS — D649 Anemia, unspecified: Secondary | ICD-10-CM | POA: Diagnosis not present

## 2018-04-13 DIAGNOSIS — Z713 Dietary counseling and surveillance: Secondary | ICD-10-CM | POA: Diagnosis not present

## 2018-04-13 DIAGNOSIS — E669 Obesity, unspecified: Secondary | ICD-10-CM | POA: Diagnosis not present

## 2018-04-13 DIAGNOSIS — M1990 Unspecified osteoarthritis, unspecified site: Secondary | ICD-10-CM | POA: Diagnosis not present

## 2018-04-13 DIAGNOSIS — I10 Essential (primary) hypertension: Secondary | ICD-10-CM | POA: Diagnosis not present

## 2018-04-13 DIAGNOSIS — Z723 Lack of physical exercise: Secondary | ICD-10-CM | POA: Diagnosis not present

## 2018-04-13 DIAGNOSIS — K5900 Constipation, unspecified: Secondary | ICD-10-CM | POA: Diagnosis not present

## 2018-04-17 DIAGNOSIS — F411 Generalized anxiety disorder: Secondary | ICD-10-CM | POA: Diagnosis not present

## 2018-04-17 DIAGNOSIS — F4310 Post-traumatic stress disorder, unspecified: Secondary | ICD-10-CM | POA: Diagnosis not present

## 2018-04-17 DIAGNOSIS — F331 Major depressive disorder, recurrent, moderate: Secondary | ICD-10-CM | POA: Diagnosis not present

## 2018-04-26 DIAGNOSIS — F411 Generalized anxiety disorder: Secondary | ICD-10-CM | POA: Diagnosis not present

## 2018-04-26 DIAGNOSIS — F4310 Post-traumatic stress disorder, unspecified: Secondary | ICD-10-CM | POA: Diagnosis not present

## 2018-04-26 DIAGNOSIS — F331 Major depressive disorder, recurrent, moderate: Secondary | ICD-10-CM | POA: Diagnosis not present

## 2018-05-03 DIAGNOSIS — F411 Generalized anxiety disorder: Secondary | ICD-10-CM | POA: Diagnosis not present

## 2018-05-03 DIAGNOSIS — F331 Major depressive disorder, recurrent, moderate: Secondary | ICD-10-CM | POA: Diagnosis not present

## 2018-05-03 DIAGNOSIS — F4310 Post-traumatic stress disorder, unspecified: Secondary | ICD-10-CM | POA: Diagnosis not present

## 2018-05-10 DIAGNOSIS — F4310 Post-traumatic stress disorder, unspecified: Secondary | ICD-10-CM | POA: Diagnosis not present

## 2018-05-10 DIAGNOSIS — F331 Major depressive disorder, recurrent, moderate: Secondary | ICD-10-CM | POA: Diagnosis not present

## 2018-05-10 DIAGNOSIS — F411 Generalized anxiety disorder: Secondary | ICD-10-CM | POA: Diagnosis not present

## 2018-05-16 DIAGNOSIS — R0602 Shortness of breath: Secondary | ICD-10-CM | POA: Diagnosis not present

## 2018-05-16 DIAGNOSIS — B349 Viral infection, unspecified: Secondary | ICD-10-CM | POA: Diagnosis not present

## 2018-05-17 DIAGNOSIS — F4310 Post-traumatic stress disorder, unspecified: Secondary | ICD-10-CM | POA: Diagnosis not present

## 2018-05-17 DIAGNOSIS — F411 Generalized anxiety disorder: Secondary | ICD-10-CM | POA: Diagnosis not present

## 2018-05-17 DIAGNOSIS — F331 Major depressive disorder, recurrent, moderate: Secondary | ICD-10-CM | POA: Diagnosis not present

## 2018-05-22 DIAGNOSIS — F331 Major depressive disorder, recurrent, moderate: Secondary | ICD-10-CM | POA: Diagnosis not present

## 2018-05-22 DIAGNOSIS — F411 Generalized anxiety disorder: Secondary | ICD-10-CM | POA: Diagnosis not present

## 2018-05-22 DIAGNOSIS — F410 Panic disorder [episodic paroxysmal anxiety] without agoraphobia: Secondary | ICD-10-CM | POA: Diagnosis not present

## 2018-05-22 DIAGNOSIS — F8081 Childhood onset fluency disorder: Secondary | ICD-10-CM | POA: Diagnosis not present

## 2018-05-24 DIAGNOSIS — F4310 Post-traumatic stress disorder, unspecified: Secondary | ICD-10-CM | POA: Diagnosis not present

## 2018-05-24 DIAGNOSIS — F411 Generalized anxiety disorder: Secondary | ICD-10-CM | POA: Diagnosis not present

## 2018-05-24 DIAGNOSIS — F331 Major depressive disorder, recurrent, moderate: Secondary | ICD-10-CM | POA: Diagnosis not present

## 2018-06-07 DIAGNOSIS — F8081 Childhood onset fluency disorder: Secondary | ICD-10-CM | POA: Diagnosis not present

## 2018-06-07 DIAGNOSIS — F410 Panic disorder [episodic paroxysmal anxiety] without agoraphobia: Secondary | ICD-10-CM | POA: Diagnosis not present

## 2018-06-07 DIAGNOSIS — F331 Major depressive disorder, recurrent, moderate: Secondary | ICD-10-CM | POA: Diagnosis not present

## 2018-06-07 DIAGNOSIS — F411 Generalized anxiety disorder: Secondary | ICD-10-CM | POA: Diagnosis not present

## 2018-06-07 DIAGNOSIS — F4310 Post-traumatic stress disorder, unspecified: Secondary | ICD-10-CM | POA: Diagnosis not present

## 2018-06-17 DIAGNOSIS — F4310 Post-traumatic stress disorder, unspecified: Secondary | ICD-10-CM | POA: Diagnosis not present

## 2018-06-17 DIAGNOSIS — F411 Generalized anxiety disorder: Secondary | ICD-10-CM | POA: Diagnosis not present

## 2018-06-17 DIAGNOSIS — F331 Major depressive disorder, recurrent, moderate: Secondary | ICD-10-CM | POA: Diagnosis not present

## 2018-06-28 DIAGNOSIS — F4310 Post-traumatic stress disorder, unspecified: Secondary | ICD-10-CM | POA: Diagnosis not present

## 2018-06-28 DIAGNOSIS — F331 Major depressive disorder, recurrent, moderate: Secondary | ICD-10-CM | POA: Diagnosis not present

## 2018-06-28 DIAGNOSIS — F411 Generalized anxiety disorder: Secondary | ICD-10-CM | POA: Diagnosis not present

## 2018-07-19 DIAGNOSIS — F331 Major depressive disorder, recurrent, moderate: Secondary | ICD-10-CM | POA: Diagnosis not present

## 2018-07-19 DIAGNOSIS — F411 Generalized anxiety disorder: Secondary | ICD-10-CM | POA: Diagnosis not present

## 2018-07-19 DIAGNOSIS — F4310 Post-traumatic stress disorder, unspecified: Secondary | ICD-10-CM | POA: Diagnosis not present

## 2018-07-20 DIAGNOSIS — R0602 Shortness of breath: Secondary | ICD-10-CM | POA: Diagnosis not present

## 2018-07-20 DIAGNOSIS — D649 Anemia, unspecified: Secondary | ICD-10-CM | POA: Diagnosis not present

## 2018-07-20 DIAGNOSIS — Z713 Dietary counseling and surveillance: Secondary | ICD-10-CM | POA: Diagnosis not present

## 2018-07-20 DIAGNOSIS — M1990 Unspecified osteoarthritis, unspecified site: Secondary | ICD-10-CM | POA: Diagnosis not present

## 2018-07-20 DIAGNOSIS — I10 Essential (primary) hypertension: Secondary | ICD-10-CM | POA: Diagnosis not present

## 2018-07-20 DIAGNOSIS — Z723 Lack of physical exercise: Secondary | ICD-10-CM | POA: Diagnosis not present

## 2018-07-26 DIAGNOSIS — K5900 Constipation, unspecified: Secondary | ICD-10-CM | POA: Diagnosis not present

## 2018-07-26 DIAGNOSIS — E669 Obesity, unspecified: Secondary | ICD-10-CM | POA: Diagnosis not present

## 2018-07-26 DIAGNOSIS — I10 Essential (primary) hypertension: Secondary | ICD-10-CM | POA: Diagnosis not present

## 2018-07-26 DIAGNOSIS — F4310 Post-traumatic stress disorder, unspecified: Secondary | ICD-10-CM | POA: Diagnosis not present

## 2018-07-26 DIAGNOSIS — F411 Generalized anxiety disorder: Secondary | ICD-10-CM | POA: Diagnosis not present

## 2018-07-26 DIAGNOSIS — F331 Major depressive disorder, recurrent, moderate: Secondary | ICD-10-CM | POA: Diagnosis not present

## 2018-07-26 DIAGNOSIS — M1990 Unspecified osteoarthritis, unspecified site: Secondary | ICD-10-CM | POA: Diagnosis not present

## 2018-07-26 DIAGNOSIS — Z723 Lack of physical exercise: Secondary | ICD-10-CM | POA: Diagnosis not present

## 2018-07-26 DIAGNOSIS — Z713 Dietary counseling and surveillance: Secondary | ICD-10-CM | POA: Diagnosis not present

## 2018-08-02 DIAGNOSIS — F4310 Post-traumatic stress disorder, unspecified: Secondary | ICD-10-CM | POA: Diagnosis not present

## 2018-08-02 DIAGNOSIS — F331 Major depressive disorder, recurrent, moderate: Secondary | ICD-10-CM | POA: Diagnosis not present

## 2018-08-02 DIAGNOSIS — F411 Generalized anxiety disorder: Secondary | ICD-10-CM | POA: Diagnosis not present

## 2018-08-03 DIAGNOSIS — Z713 Dietary counseling and surveillance: Secondary | ICD-10-CM | POA: Diagnosis not present

## 2018-08-03 DIAGNOSIS — I10 Essential (primary) hypertension: Secondary | ICD-10-CM | POA: Diagnosis not present

## 2018-08-03 DIAGNOSIS — D649 Anemia, unspecified: Secondary | ICD-10-CM | POA: Diagnosis not present

## 2018-08-03 DIAGNOSIS — M1990 Unspecified osteoarthritis, unspecified site: Secondary | ICD-10-CM | POA: Diagnosis not present

## 2018-08-03 DIAGNOSIS — K5900 Constipation, unspecified: Secondary | ICD-10-CM | POA: Diagnosis not present

## 2018-08-03 DIAGNOSIS — Z723 Lack of physical exercise: Secondary | ICD-10-CM | POA: Diagnosis not present

## 2018-08-07 DIAGNOSIS — F331 Major depressive disorder, recurrent, moderate: Secondary | ICD-10-CM | POA: Diagnosis not present

## 2018-08-07 DIAGNOSIS — F411 Generalized anxiety disorder: Secondary | ICD-10-CM | POA: Diagnosis not present

## 2018-08-07 DIAGNOSIS — F900 Attention-deficit hyperactivity disorder, predominantly inattentive type: Secondary | ICD-10-CM | POA: Diagnosis not present

## 2018-08-16 DIAGNOSIS — F411 Generalized anxiety disorder: Secondary | ICD-10-CM | POA: Diagnosis not present

## 2018-08-16 DIAGNOSIS — F4310 Post-traumatic stress disorder, unspecified: Secondary | ICD-10-CM | POA: Diagnosis not present

## 2018-08-16 DIAGNOSIS — F331 Major depressive disorder, recurrent, moderate: Secondary | ICD-10-CM | POA: Diagnosis not present

## 2018-08-17 DIAGNOSIS — Z723 Lack of physical exercise: Secondary | ICD-10-CM | POA: Diagnosis not present

## 2018-08-17 DIAGNOSIS — R0602 Shortness of breath: Secondary | ICD-10-CM | POA: Diagnosis not present

## 2018-08-17 DIAGNOSIS — I10 Essential (primary) hypertension: Secondary | ICD-10-CM | POA: Diagnosis not present

## 2018-08-17 DIAGNOSIS — K5900 Constipation, unspecified: Secondary | ICD-10-CM | POA: Diagnosis not present

## 2018-08-17 DIAGNOSIS — Z713 Dietary counseling and surveillance: Secondary | ICD-10-CM | POA: Diagnosis not present

## 2018-08-17 DIAGNOSIS — D649 Anemia, unspecified: Secondary | ICD-10-CM | POA: Diagnosis not present

## 2018-08-23 DIAGNOSIS — F4310 Post-traumatic stress disorder, unspecified: Secondary | ICD-10-CM | POA: Diagnosis not present

## 2018-08-23 DIAGNOSIS — F331 Major depressive disorder, recurrent, moderate: Secondary | ICD-10-CM | POA: Diagnosis not present

## 2018-08-23 DIAGNOSIS — F411 Generalized anxiety disorder: Secondary | ICD-10-CM | POA: Diagnosis not present

## 2018-08-24 DIAGNOSIS — D649 Anemia, unspecified: Secondary | ICD-10-CM | POA: Diagnosis not present

## 2018-08-24 DIAGNOSIS — I10 Essential (primary) hypertension: Secondary | ICD-10-CM | POA: Diagnosis not present

## 2018-08-24 DIAGNOSIS — E669 Obesity, unspecified: Secondary | ICD-10-CM | POA: Diagnosis not present

## 2018-08-24 DIAGNOSIS — K5900 Constipation, unspecified: Secondary | ICD-10-CM | POA: Diagnosis not present

## 2018-08-24 DIAGNOSIS — Z713 Dietary counseling and surveillance: Secondary | ICD-10-CM | POA: Diagnosis not present

## 2018-08-24 DIAGNOSIS — R0602 Shortness of breath: Secondary | ICD-10-CM | POA: Diagnosis not present

## 2018-08-28 DIAGNOSIS — F3341 Major depressive disorder, recurrent, in partial remission: Secondary | ICD-10-CM | POA: Diagnosis not present

## 2018-08-28 DIAGNOSIS — F900 Attention-deficit hyperactivity disorder, predominantly inattentive type: Secondary | ICD-10-CM | POA: Diagnosis not present

## 2018-08-28 DIAGNOSIS — F411 Generalized anxiety disorder: Secondary | ICD-10-CM | POA: Diagnosis not present

## 2018-08-28 DIAGNOSIS — F8081 Childhood onset fluency disorder: Secondary | ICD-10-CM | POA: Diagnosis not present

## 2018-08-30 DIAGNOSIS — F411 Generalized anxiety disorder: Secondary | ICD-10-CM | POA: Diagnosis not present

## 2018-08-30 DIAGNOSIS — F331 Major depressive disorder, recurrent, moderate: Secondary | ICD-10-CM | POA: Diagnosis not present

## 2018-08-30 DIAGNOSIS — F4310 Post-traumatic stress disorder, unspecified: Secondary | ICD-10-CM | POA: Diagnosis not present

## 2018-08-31 DIAGNOSIS — W458XXA Other foreign body or object entering through skin, initial encounter: Secondary | ICD-10-CM | POA: Diagnosis not present

## 2018-08-31 DIAGNOSIS — Z79899 Other long term (current) drug therapy: Secondary | ICD-10-CM | POA: Diagnosis not present

## 2018-08-31 DIAGNOSIS — D649 Anemia, unspecified: Secondary | ICD-10-CM | POA: Diagnosis not present

## 2018-08-31 DIAGNOSIS — S99929A Unspecified injury of unspecified foot, initial encounter: Secondary | ICD-10-CM | POA: Diagnosis not present

## 2018-08-31 DIAGNOSIS — Z23 Encounter for immunization: Secondary | ICD-10-CM | POA: Diagnosis not present

## 2018-08-31 DIAGNOSIS — W102XXA Fall (on)(from) incline, initial encounter: Secondary | ICD-10-CM | POA: Diagnosis not present

## 2018-08-31 DIAGNOSIS — I10 Essential (primary) hypertension: Secondary | ICD-10-CM | POA: Diagnosis not present

## 2018-08-31 DIAGNOSIS — Z713 Dietary counseling and surveillance: Secondary | ICD-10-CM | POA: Diagnosis not present

## 2018-08-31 DIAGNOSIS — E669 Obesity, unspecified: Secondary | ICD-10-CM | POA: Diagnosis not present

## 2018-08-31 DIAGNOSIS — S93601A Unspecified sprain of right foot, initial encounter: Secondary | ICD-10-CM | POA: Diagnosis not present

## 2018-08-31 DIAGNOSIS — M7731 Calcaneal spur, right foot: Secondary | ICD-10-CM | POA: Diagnosis not present

## 2018-08-31 DIAGNOSIS — R635 Abnormal weight gain: Secondary | ICD-10-CM | POA: Diagnosis not present

## 2018-08-31 DIAGNOSIS — S91331A Puncture wound without foreign body, right foot, initial encounter: Secondary | ICD-10-CM | POA: Diagnosis not present

## 2018-08-31 DIAGNOSIS — K5900 Constipation, unspecified: Secondary | ICD-10-CM | POA: Diagnosis not present

## 2018-09-07 DIAGNOSIS — I10 Essential (primary) hypertension: Secondary | ICD-10-CM | POA: Diagnosis not present

## 2018-09-07 DIAGNOSIS — E669 Obesity, unspecified: Secondary | ICD-10-CM | POA: Diagnosis not present

## 2018-09-07 DIAGNOSIS — D649 Anemia, unspecified: Secondary | ICD-10-CM | POA: Diagnosis not present

## 2018-09-07 DIAGNOSIS — Z713 Dietary counseling and surveillance: Secondary | ICD-10-CM | POA: Diagnosis not present

## 2018-10-11 DIAGNOSIS — F331 Major depressive disorder, recurrent, moderate: Secondary | ICD-10-CM | POA: Diagnosis not present

## 2018-10-11 DIAGNOSIS — F4310 Post-traumatic stress disorder, unspecified: Secondary | ICD-10-CM | POA: Diagnosis not present

## 2018-10-11 DIAGNOSIS — F411 Generalized anxiety disorder: Secondary | ICD-10-CM | POA: Diagnosis not present

## 2018-10-17 DIAGNOSIS — F3341 Major depressive disorder, recurrent, in partial remission: Secondary | ICD-10-CM | POA: Diagnosis not present

## 2018-10-17 DIAGNOSIS — F900 Attention-deficit hyperactivity disorder, predominantly inattentive type: Secondary | ICD-10-CM | POA: Diagnosis not present

## 2018-10-17 DIAGNOSIS — F411 Generalized anxiety disorder: Secondary | ICD-10-CM | POA: Diagnosis not present

## 2018-10-25 DIAGNOSIS — M79671 Pain in right foot: Secondary | ICD-10-CM | POA: Diagnosis not present

## 2018-10-25 DIAGNOSIS — Z23 Encounter for immunization: Secondary | ICD-10-CM | POA: Diagnosis not present

## 2018-10-25 DIAGNOSIS — M7731 Calcaneal spur, right foot: Secondary | ICD-10-CM | POA: Diagnosis not present

## 2018-10-28 DIAGNOSIS — K080 Exfoliation of teeth due to systemic causes: Secondary | ICD-10-CM | POA: Diagnosis not present

## 2018-11-07 DIAGNOSIS — Z20828 Contact with and (suspected) exposure to other viral communicable diseases: Secondary | ICD-10-CM | POA: Diagnosis not present

## 2018-11-07 DIAGNOSIS — F419 Anxiety disorder, unspecified: Secondary | ICD-10-CM | POA: Diagnosis not present

## 2018-11-07 DIAGNOSIS — R079 Chest pain, unspecified: Secondary | ICD-10-CM | POA: Diagnosis not present

## 2018-11-07 DIAGNOSIS — R05 Cough: Secondary | ICD-10-CM | POA: Diagnosis not present

## 2018-11-07 DIAGNOSIS — F329 Major depressive disorder, single episode, unspecified: Secondary | ICD-10-CM | POA: Diagnosis not present

## 2018-11-07 DIAGNOSIS — R0789 Other chest pain: Secondary | ICD-10-CM | POA: Diagnosis not present

## 2018-11-07 DIAGNOSIS — Z79899 Other long term (current) drug therapy: Secondary | ICD-10-CM | POA: Diagnosis not present

## 2018-11-14 DIAGNOSIS — Z20828 Contact with and (suspected) exposure to other viral communicable diseases: Secondary | ICD-10-CM | POA: Diagnosis not present

## 2018-11-27 DIAGNOSIS — Z723 Lack of physical exercise: Secondary | ICD-10-CM | POA: Diagnosis not present

## 2018-11-27 DIAGNOSIS — I10 Essential (primary) hypertension: Secondary | ICD-10-CM | POA: Diagnosis not present

## 2018-11-27 DIAGNOSIS — Z713 Dietary counseling and surveillance: Secondary | ICD-10-CM | POA: Diagnosis not present

## 2018-11-27 DIAGNOSIS — K5900 Constipation, unspecified: Secondary | ICD-10-CM | POA: Diagnosis not present

## 2018-11-27 DIAGNOSIS — M1990 Unspecified osteoarthritis, unspecified site: Secondary | ICD-10-CM | POA: Diagnosis not present

## 2018-12-04 DIAGNOSIS — R635 Abnormal weight gain: Secondary | ICD-10-CM | POA: Diagnosis not present

## 2018-12-04 DIAGNOSIS — Z713 Dietary counseling and surveillance: Secondary | ICD-10-CM | POA: Diagnosis not present

## 2018-12-04 DIAGNOSIS — R0602 Shortness of breath: Secondary | ICD-10-CM | POA: Diagnosis not present

## 2018-12-04 DIAGNOSIS — I10 Essential (primary) hypertension: Secondary | ICD-10-CM | POA: Diagnosis not present

## 2018-12-04 DIAGNOSIS — E669 Obesity, unspecified: Secondary | ICD-10-CM | POA: Diagnosis not present

## 2018-12-04 DIAGNOSIS — Z723 Lack of physical exercise: Secondary | ICD-10-CM | POA: Diagnosis not present

## 2018-12-05 DIAGNOSIS — Z20828 Contact with and (suspected) exposure to other viral communicable diseases: Secondary | ICD-10-CM | POA: Diagnosis not present

## 2018-12-11 DIAGNOSIS — I10 Essential (primary) hypertension: Secondary | ICD-10-CM | POA: Diagnosis not present

## 2018-12-11 DIAGNOSIS — M1990 Unspecified osteoarthritis, unspecified site: Secondary | ICD-10-CM | POA: Diagnosis not present

## 2018-12-11 DIAGNOSIS — Z713 Dietary counseling and surveillance: Secondary | ICD-10-CM | POA: Diagnosis not present

## 2018-12-11 DIAGNOSIS — Z723 Lack of physical exercise: Secondary | ICD-10-CM | POA: Diagnosis not present

## 2018-12-12 DIAGNOSIS — U071 COVID-19: Secondary | ICD-10-CM | POA: Diagnosis not present

## 2018-12-24 DIAGNOSIS — Z880 Allergy status to penicillin: Secondary | ICD-10-CM | POA: Diagnosis not present

## 2018-12-24 DIAGNOSIS — R0781 Pleurodynia: Secondary | ICD-10-CM | POA: Diagnosis not present

## 2018-12-24 DIAGNOSIS — Z79899 Other long term (current) drug therapy: Secondary | ICD-10-CM | POA: Diagnosis not present

## 2018-12-24 DIAGNOSIS — R0602 Shortness of breath: Secondary | ICD-10-CM | POA: Diagnosis not present

## 2018-12-24 DIAGNOSIS — F419 Anxiety disorder, unspecified: Secondary | ICD-10-CM | POA: Diagnosis not present

## 2018-12-24 DIAGNOSIS — R079 Chest pain, unspecified: Secondary | ICD-10-CM | POA: Diagnosis not present

## 2018-12-24 DIAGNOSIS — J1289 Other viral pneumonia: Secondary | ICD-10-CM | POA: Diagnosis not present

## 2018-12-24 DIAGNOSIS — Z881 Allergy status to other antibiotic agents status: Secondary | ICD-10-CM | POA: Diagnosis not present

## 2018-12-24 DIAGNOSIS — Z9102 Food additives allergy status: Secondary | ICD-10-CM | POA: Diagnosis not present

## 2018-12-24 DIAGNOSIS — R0789 Other chest pain: Secondary | ICD-10-CM | POA: Diagnosis not present

## 2018-12-24 DIAGNOSIS — U071 COVID-19: Secondary | ICD-10-CM | POA: Diagnosis not present

## 2018-12-27 DIAGNOSIS — F411 Generalized anxiety disorder: Secondary | ICD-10-CM | POA: Diagnosis not present

## 2018-12-27 DIAGNOSIS — F3342 Major depressive disorder, recurrent, in full remission: Secondary | ICD-10-CM | POA: Diagnosis not present

## 2018-12-27 DIAGNOSIS — F900 Attention-deficit hyperactivity disorder, predominantly inattentive type: Secondary | ICD-10-CM | POA: Diagnosis not present

## 2019-01-08 DIAGNOSIS — U071 COVID-19: Secondary | ICD-10-CM | POA: Diagnosis not present

## 2019-01-08 DIAGNOSIS — R0602 Shortness of breath: Secondary | ICD-10-CM | POA: Diagnosis not present

## 2019-01-09 DIAGNOSIS — J189 Pneumonia, unspecified organism: Secondary | ICD-10-CM | POA: Diagnosis not present

## 2019-01-09 DIAGNOSIS — R0602 Shortness of breath: Secondary | ICD-10-CM | POA: Diagnosis not present

## 2019-01-09 DIAGNOSIS — Z8619 Personal history of other infectious and parasitic diseases: Secondary | ICD-10-CM | POA: Diagnosis not present

## 2019-01-11 DIAGNOSIS — I10 Essential (primary) hypertension: Secondary | ICD-10-CM | POA: Diagnosis not present

## 2019-01-11 DIAGNOSIS — R635 Abnormal weight gain: Secondary | ICD-10-CM | POA: Diagnosis not present

## 2019-01-11 DIAGNOSIS — E669 Obesity, unspecified: Secondary | ICD-10-CM | POA: Diagnosis not present

## 2019-01-11 DIAGNOSIS — Z723 Lack of physical exercise: Secondary | ICD-10-CM | POA: Diagnosis not present

## 2019-01-11 DIAGNOSIS — Z713 Dietary counseling and surveillance: Secondary | ICD-10-CM | POA: Diagnosis not present

## 2019-01-18 DIAGNOSIS — Z713 Dietary counseling and surveillance: Secondary | ICD-10-CM | POA: Diagnosis not present

## 2019-01-18 DIAGNOSIS — Z723 Lack of physical exercise: Secondary | ICD-10-CM | POA: Diagnosis not present
